# Patient Record
Sex: Male | Born: 1935 | Race: White | Hispanic: No | Smoking: Former smoker
Health system: Southern US, Community
[De-identification: ages and names within clinical notes are randomized; demographics above are authoritative.]

## PROBLEM LIST (undated history)

## (undated) DIAGNOSIS — F419 Anxiety disorder, unspecified: Secondary | ICD-10-CM

## (undated) DIAGNOSIS — I480 Paroxysmal atrial fibrillation: Secondary | ICD-10-CM

## (undated) DIAGNOSIS — C3490 Malignant neoplasm of unspecified part of unspecified bronchus or lung: Secondary | ICD-10-CM

## (undated) DIAGNOSIS — I219 Acute myocardial infarction, unspecified: Secondary | ICD-10-CM

## (undated) DIAGNOSIS — D509 Iron deficiency anemia, unspecified: Secondary | ICD-10-CM

## (undated) DIAGNOSIS — I519 Heart disease, unspecified: Secondary | ICD-10-CM

## (undated) DIAGNOSIS — K219 Gastro-esophageal reflux disease without esophagitis: Secondary | ICD-10-CM

## (undated) DIAGNOSIS — I2510 Atherosclerotic heart disease of native coronary artery without angina pectoris: Secondary | ICD-10-CM

## (undated) DIAGNOSIS — Z8719 Personal history of other diseases of the digestive system: Secondary | ICD-10-CM

## (undated) DIAGNOSIS — M545 Low back pain, unspecified: Secondary | ICD-10-CM

## (undated) DIAGNOSIS — Z8489 Family history of other specified conditions: Secondary | ICD-10-CM

## (undated) DIAGNOSIS — I10 Essential (primary) hypertension: Secondary | ICD-10-CM

## (undated) DIAGNOSIS — Z8711 Personal history of peptic ulcer disease: Secondary | ICD-10-CM

## (undated) DIAGNOSIS — J449 Chronic obstructive pulmonary disease, unspecified: Secondary | ICD-10-CM

## (undated) DIAGNOSIS — Z9289 Personal history of other medical treatment: Secondary | ICD-10-CM

## (undated) DIAGNOSIS — J189 Pneumonia, unspecified organism: Secondary | ICD-10-CM

## (undated) DIAGNOSIS — M1990 Unspecified osteoarthritis, unspecified site: Secondary | ICD-10-CM

## (undated) DIAGNOSIS — I493 Ventricular premature depolarization: Secondary | ICD-10-CM

## (undated) DIAGNOSIS — I739 Peripheral vascular disease, unspecified: Secondary | ICD-10-CM

## (undated) DIAGNOSIS — I209 Angina pectoris, unspecified: Secondary | ICD-10-CM

## (undated) DIAGNOSIS — G8929 Other chronic pain: Secondary | ICD-10-CM

## (undated) HISTORY — PX: APPENDECTOMY: SHX54

## (undated) HISTORY — DX: Chronic obstructive pulmonary disease, unspecified: J44.9

## (undated) HISTORY — PX: BACK SURGERY: SHX140

## (undated) HISTORY — DX: Peripheral vascular disease, unspecified: I73.9

## (undated) HISTORY — PX: LOBECTOMY: SHX5089

## (undated) HISTORY — DX: Atherosclerotic heart disease of native coronary artery without angina pectoris: I25.10

## (undated) HISTORY — PX: ANTERIOR CERVICAL DECOMP/DISCECTOMY FUSION: SHX1161

## (undated) HISTORY — DX: Paroxysmal atrial fibrillation: I48.0

## (undated) HISTORY — DX: Heart disease, unspecified: I51.9

## (undated) HISTORY — DX: Iron deficiency anemia, unspecified: D50.9

## (undated) HISTORY — PX: INGUINAL HERNIA REPAIR: SUR1180

## (undated) HISTORY — DX: Malignant neoplasm of unspecified part of unspecified bronchus or lung: C34.90

## (undated) HISTORY — DX: Ventricular premature depolarization: I49.3

## (undated) HISTORY — PX: CARDIAC CATHETERIZATION: SHX172

## (undated) HISTORY — PX: CATARACT EXTRACTION W/ INTRAOCULAR LENS  IMPLANT, BILATERAL: SHX1307

## (undated) HISTORY — DX: Essential (primary) hypertension: I10

---

## 1970-02-23 HISTORY — PX: TONSILLECTOMY: SUR1361

## 1997-06-22 ENCOUNTER — Encounter: Admission: RE | Admit: 1997-06-22 | Discharge: 1997-09-20 | Payer: Self-pay | Admitting: Neurological Surgery

## 1997-08-07 ENCOUNTER — Ambulatory Visit (HOSPITAL_COMMUNITY): Admission: RE | Admit: 1997-08-07 | Discharge: 1997-08-07 | Payer: Self-pay | Admitting: Gastroenterology

## 1998-08-09 ENCOUNTER — Encounter: Payer: Self-pay | Admitting: Neurological Surgery

## 1998-08-09 ENCOUNTER — Ambulatory Visit (HOSPITAL_COMMUNITY): Admission: RE | Admit: 1998-08-09 | Discharge: 1998-08-09 | Payer: Self-pay | Admitting: Neurological Surgery

## 1998-12-17 ENCOUNTER — Encounter: Admission: RE | Admit: 1998-12-17 | Discharge: 1998-12-17 | Payer: Self-pay | Admitting: Neurological Surgery

## 1998-12-17 ENCOUNTER — Encounter: Payer: Self-pay | Admitting: Neurological Surgery

## 1998-12-27 ENCOUNTER — Encounter: Payer: Self-pay | Admitting: Neurological Surgery

## 1998-12-27 ENCOUNTER — Ambulatory Visit (HOSPITAL_COMMUNITY): Admission: RE | Admit: 1998-12-27 | Discharge: 1998-12-27 | Payer: Self-pay | Admitting: Neurological Surgery

## 1999-01-15 ENCOUNTER — Encounter: Payer: Self-pay | Admitting: Neurological Surgery

## 1999-01-15 ENCOUNTER — Ambulatory Visit (HOSPITAL_COMMUNITY): Admission: RE | Admit: 1999-01-15 | Discharge: 1999-01-15 | Payer: Self-pay | Admitting: Neurological Surgery

## 1999-06-23 ENCOUNTER — Encounter: Payer: Self-pay | Admitting: Emergency Medicine

## 1999-06-23 ENCOUNTER — Inpatient Hospital Stay (HOSPITAL_COMMUNITY): Admission: EM | Admit: 1999-06-23 | Discharge: 1999-06-24 | Payer: Self-pay | Admitting: Emergency Medicine

## 1999-09-23 ENCOUNTER — Ambulatory Visit (HOSPITAL_COMMUNITY): Admission: RE | Admit: 1999-09-23 | Discharge: 1999-09-23 | Payer: Self-pay | Admitting: Gastroenterology

## 1999-11-21 ENCOUNTER — Encounter: Payer: Self-pay | Admitting: Urology

## 1999-11-21 ENCOUNTER — Encounter: Admission: RE | Admit: 1999-11-21 | Discharge: 1999-11-21 | Payer: Self-pay | Admitting: Urology

## 2000-01-12 ENCOUNTER — Encounter: Payer: Self-pay | Admitting: Emergency Medicine

## 2000-01-12 ENCOUNTER — Emergency Department (HOSPITAL_COMMUNITY): Admission: EM | Admit: 2000-01-12 | Discharge: 2000-01-12 | Payer: Self-pay | Admitting: Emergency Medicine

## 2000-04-06 ENCOUNTER — Ambulatory Visit (HOSPITAL_COMMUNITY): Admission: RE | Admit: 2000-04-06 | Discharge: 2000-04-06 | Payer: Self-pay | Admitting: Gastroenterology

## 2000-04-06 ENCOUNTER — Encounter (INDEPENDENT_AMBULATORY_CARE_PROVIDER_SITE_OTHER): Payer: Self-pay

## 2000-05-14 ENCOUNTER — Encounter: Payer: Self-pay | Admitting: Neurological Surgery

## 2000-05-14 ENCOUNTER — Encounter: Admission: RE | Admit: 2000-05-14 | Discharge: 2000-05-14 | Payer: Self-pay | Admitting: Neurological Surgery

## 2001-05-02 ENCOUNTER — Encounter: Payer: Self-pay | Admitting: Gastroenterology

## 2001-05-02 ENCOUNTER — Ambulatory Visit (HOSPITAL_COMMUNITY): Admission: RE | Admit: 2001-05-02 | Discharge: 2001-05-02 | Payer: Self-pay | Admitting: Family Medicine

## 2001-05-02 HISTORY — PX: OTHER SURGICAL HISTORY: SHX169

## 2001-11-28 ENCOUNTER — Encounter: Payer: Self-pay | Admitting: Family Medicine

## 2001-11-28 ENCOUNTER — Encounter: Admission: RE | Admit: 2001-11-28 | Discharge: 2001-11-28 | Payer: Self-pay | Admitting: Family Medicine

## 2003-07-11 ENCOUNTER — Ambulatory Visit (HOSPITAL_COMMUNITY): Admission: RE | Admit: 2003-07-11 | Discharge: 2003-07-11 | Payer: Self-pay | Admitting: Vascular Surgery

## 2003-07-11 HISTORY — PX: OTHER SURGICAL HISTORY: SHX169

## 2004-02-24 HISTORY — PX: CORONARY ANGIOPLASTY WITH STENT PLACEMENT: SHX49

## 2004-03-25 ENCOUNTER — Inpatient Hospital Stay (HOSPITAL_BASED_OUTPATIENT_CLINIC_OR_DEPARTMENT_OTHER): Admission: RE | Admit: 2004-03-25 | Discharge: 2004-03-25 | Payer: Self-pay | Admitting: Cardiology

## 2004-03-27 ENCOUNTER — Ambulatory Visit (HOSPITAL_COMMUNITY): Admission: RE | Admit: 2004-03-27 | Discharge: 2004-03-28 | Payer: Self-pay | Admitting: Cardiology

## 2004-12-04 ENCOUNTER — Ambulatory Visit (HOSPITAL_COMMUNITY): Admission: RE | Admit: 2004-12-04 | Discharge: 2004-12-04 | Payer: Self-pay | Admitting: Urology

## 2005-09-30 ENCOUNTER — Encounter: Admission: RE | Admit: 2005-09-30 | Discharge: 2005-09-30 | Payer: Self-pay | Admitting: Family Medicine

## 2005-10-07 ENCOUNTER — Ambulatory Visit (HOSPITAL_COMMUNITY): Admission: RE | Admit: 2005-10-07 | Discharge: 2005-10-07 | Payer: Self-pay | Admitting: Family Medicine

## 2005-10-19 ENCOUNTER — Ambulatory Visit (HOSPITAL_COMMUNITY): Admission: RE | Admit: 2005-10-19 | Discharge: 2005-10-19 | Payer: Self-pay | Admitting: Thoracic Surgery

## 2005-10-20 ENCOUNTER — Encounter (INDEPENDENT_AMBULATORY_CARE_PROVIDER_SITE_OTHER): Payer: Self-pay | Admitting: *Deleted

## 2005-10-20 ENCOUNTER — Ambulatory Visit (HOSPITAL_COMMUNITY): Admission: RE | Admit: 2005-10-20 | Discharge: 2005-10-20 | Payer: Self-pay | Admitting: Thoracic Surgery

## 2005-10-20 HISTORY — PX: OTHER SURGICAL HISTORY: SHX169

## 2005-11-02 ENCOUNTER — Encounter (INDEPENDENT_AMBULATORY_CARE_PROVIDER_SITE_OTHER): Payer: Self-pay | Admitting: Specialist

## 2005-11-02 ENCOUNTER — Ambulatory Visit (HOSPITAL_COMMUNITY): Admission: RE | Admit: 2005-11-02 | Discharge: 2005-11-02 | Payer: Self-pay | Admitting: Thoracic Surgery

## 2005-12-01 ENCOUNTER — Inpatient Hospital Stay (HOSPITAL_COMMUNITY): Admission: RE | Admit: 2005-12-01 | Discharge: 2005-12-08 | Payer: Self-pay | Admitting: Thoracic Surgery

## 2005-12-01 ENCOUNTER — Encounter (INDEPENDENT_AMBULATORY_CARE_PROVIDER_SITE_OTHER): Payer: Self-pay | Admitting: Specialist

## 2005-12-01 HISTORY — PX: VIDEO ASSISTED THORACOSCOPY: SHX5073

## 2005-12-04 ENCOUNTER — Ambulatory Visit: Payer: Self-pay | Admitting: Internal Medicine

## 2005-12-07 ENCOUNTER — Ambulatory Visit: Payer: Self-pay | Admitting: Internal Medicine

## 2005-12-15 ENCOUNTER — Encounter: Admission: RE | Admit: 2005-12-15 | Discharge: 2005-12-15 | Payer: Self-pay | Admitting: Thoracic Surgery

## 2005-12-22 LAB — COMPREHENSIVE METABOLIC PANEL
ALT: 14 U/L (ref 0–40)
AST: 15 U/L (ref 0–37)
Albumin: 3.7 g/dL (ref 3.5–5.2)
Alkaline Phosphatase: 158 U/L — ABNORMAL HIGH (ref 39–117)
CO2: 29 mEq/L (ref 19–32)
Creatinine, Ser: 0.56 mg/dL (ref 0.40–1.50)
Potassium: 4.4 mEq/L (ref 3.5–5.3)
Sodium: 132 mEq/L — ABNORMAL LOW (ref 135–145)
Total Bilirubin: 0.6 mg/dL (ref 0.3–1.2)
Total Protein: 6.6 g/dL (ref 6.0–8.3)

## 2005-12-22 LAB — CBC WITH DIFFERENTIAL/PLATELET
BASO%: 0.6 % (ref 0.0–2.0)
LYMPH%: 20.6 % (ref 14.0–48.0)
MCHC: 33.3 g/dL (ref 32.0–35.9)
MONO#: 0.9 10*3/uL (ref 0.1–0.9)
NEUT#: 5.3 10*3/uL (ref 1.5–6.5)
Platelets: 556 10*3/uL — ABNORMAL HIGH (ref 145–400)
RBC: 4.06 10*6/uL — ABNORMAL LOW (ref 4.20–5.71)
RDW: 18.7 % — ABNORMAL HIGH (ref 11.2–14.6)
WBC: 8.3 10*3/uL (ref 4.0–10.0)

## 2005-12-23 ENCOUNTER — Encounter: Admission: RE | Admit: 2005-12-23 | Discharge: 2005-12-23 | Payer: Self-pay | Admitting: Thoracic Surgery

## 2006-01-05 ENCOUNTER — Encounter: Admission: RE | Admit: 2006-01-05 | Discharge: 2006-01-05 | Payer: Self-pay | Admitting: Thoracic Surgery

## 2006-01-12 LAB — CBC WITH DIFFERENTIAL/PLATELET
BASO%: 1.4 % (ref 0.0–2.0)
Basophils Absolute: 0.1 10*3/uL (ref 0.0–0.1)
EOS%: 4.5 % (ref 0.0–7.0)
HGB: 10 g/dL — ABNORMAL LOW (ref 13.0–17.1)
MCH: 25.2 pg — ABNORMAL LOW (ref 28.0–33.4)
MCHC: 31.6 g/dL — ABNORMAL LOW (ref 32.0–35.9)
MCV: 79.6 fL — ABNORMAL LOW (ref 81.6–98.0)
MONO%: 12.1 % (ref 0.0–13.0)
NEUT%: 61.5 % (ref 40.0–75.0)
RDW: 14.2 % (ref 11.2–14.6)

## 2006-01-12 LAB — COMPREHENSIVE METABOLIC PANEL
AST: 10 U/L (ref 0–37)
Alkaline Phosphatase: 110 U/L (ref 39–117)
BUN: 9 mg/dL (ref 6–23)
Creatinine, Ser: 0.61 mg/dL (ref 0.40–1.50)

## 2006-01-13 ENCOUNTER — Encounter: Payer: Self-pay | Admitting: Vascular Surgery

## 2006-01-13 ENCOUNTER — Ambulatory Visit: Admission: RE | Admit: 2006-01-13 | Discharge: 2006-01-13 | Payer: Self-pay | Admitting: Internal Medicine

## 2006-01-19 LAB — COMPREHENSIVE METABOLIC PANEL
ALT: 18 U/L (ref 0–53)
Albumin: 3.7 g/dL (ref 3.5–5.2)
CO2: 27 mEq/L (ref 19–32)
Calcium: 8.6 mg/dL (ref 8.4–10.5)
Chloride: 97 mEq/L (ref 96–112)
Glucose, Bld: 109 mg/dL — ABNORMAL HIGH (ref 70–99)
Potassium: 4.4 mEq/L (ref 3.5–5.3)
Sodium: 131 mEq/L — ABNORMAL LOW (ref 135–145)
Total Bilirubin: 0.6 mg/dL (ref 0.3–1.2)
Total Protein: 6.8 g/dL (ref 6.0–8.3)

## 2006-01-19 LAB — CBC WITH DIFFERENTIAL/PLATELET
Basophils Absolute: 0 10*3/uL (ref 0.0–0.1)
EOS%: 8.9 % — ABNORMAL HIGH (ref 0.0–7.0)
HCT: 29.9 % — ABNORMAL LOW (ref 38.7–49.9)
HGB: 9.9 g/dL — ABNORMAL LOW (ref 13.0–17.1)
LYMPH%: 34.4 % (ref 14.0–48.0)
MCH: 25.6 pg — ABNORMAL LOW (ref 28.0–33.4)
MCV: 77.7 fL — ABNORMAL LOW (ref 81.6–98.0)
MONO%: 11.8 % (ref 0.0–13.0)
NEUT%: 44.3 % (ref 40.0–75.0)
Platelets: 323 10*3/uL (ref 145–400)

## 2006-01-21 ENCOUNTER — Ambulatory Visit: Payer: Self-pay | Admitting: Internal Medicine

## 2006-01-26 LAB — COMPREHENSIVE METABOLIC PANEL
ALT: 15 U/L (ref 0–53)
AST: 15 U/L (ref 0–37)
Albumin: 3.7 g/dL (ref 3.5–5.2)
Alkaline Phosphatase: 149 U/L — ABNORMAL HIGH (ref 39–117)
BUN: 11 mg/dL (ref 6–23)
Calcium: 8.9 mg/dL (ref 8.4–10.5)
Chloride: 98 mEq/L (ref 96–112)
Potassium: 4.5 mEq/L (ref 3.5–5.3)
Sodium: 134 mEq/L — ABNORMAL LOW (ref 135–145)
Total Protein: 6.9 g/dL (ref 6.0–8.3)

## 2006-01-26 LAB — CBC WITH DIFFERENTIAL/PLATELET
Basophils Absolute: 0 10*3/uL (ref 0.0–0.1)
EOS%: 0.8 % (ref 0.0–7.0)
HGB: 10.7 g/dL — ABNORMAL LOW (ref 13.0–17.1)
MCH: 25.3 pg — ABNORMAL LOW (ref 28.0–33.4)
MCV: 76.9 fL — ABNORMAL LOW (ref 81.6–98.0)
MONO%: 3.2 % (ref 0.0–13.0)
NEUT#: 15.1 10*3/uL — ABNORMAL HIGH (ref 1.5–6.5)
RBC: 4.25 10*6/uL (ref 4.20–5.71)
RDW: 18 % — ABNORMAL HIGH (ref 11.2–14.6)
lymph#: 1.9 10*3/uL (ref 0.9–3.3)

## 2006-02-03 LAB — CBC WITH DIFFERENTIAL/PLATELET
Basophils Absolute: 0.2 10*3/uL — ABNORMAL HIGH (ref 0.0–0.1)
EOS%: 2.6 % (ref 0.0–7.0)
HCT: 30.1 % — ABNORMAL LOW (ref 38.7–49.9)
HGB: 9.6 g/dL — ABNORMAL LOW (ref 13.0–17.1)
LYMPH%: 21.3 % (ref 14.0–48.0)
MCH: 24.5 pg — ABNORMAL LOW (ref 28.0–33.4)
MCHC: 31.8 g/dL — ABNORMAL LOW (ref 32.0–35.9)
MCV: 77.2 fL — ABNORMAL LOW (ref 81.6–98.0)
NEUT%: 61.3 % (ref 40.0–75.0)
Platelets: 409 10*3/uL — ABNORMAL HIGH (ref 145–400)
lymph#: 2 10*3/uL (ref 0.9–3.3)

## 2006-02-03 LAB — COMPREHENSIVE METABOLIC PANEL
ALT: 11 U/L (ref 0–53)
AST: 12 U/L (ref 0–37)
Alkaline Phosphatase: 107 U/L (ref 39–117)
BUN: 16 mg/dL (ref 6–23)
Calcium: 9 mg/dL (ref 8.4–10.5)
Creatinine, Ser: 0.5 mg/dL (ref 0.40–1.50)
Total Bilirubin: 0.4 mg/dL (ref 0.3–1.2)

## 2006-02-15 ENCOUNTER — Emergency Department (HOSPITAL_COMMUNITY): Admission: EM | Admit: 2006-02-15 | Discharge: 2006-02-15 | Payer: Self-pay | Admitting: Emergency Medicine

## 2006-02-24 LAB — COMPREHENSIVE METABOLIC PANEL
ALT: 8 U/L (ref 0–53)
AST: 10 U/L (ref 0–37)
Alkaline Phosphatase: 107 U/L (ref 39–117)
Sodium: 135 mEq/L (ref 135–145)
Total Bilirubin: 0.4 mg/dL (ref 0.3–1.2)
Total Protein: 6.7 g/dL (ref 6.0–8.3)

## 2006-02-24 LAB — CBC WITH DIFFERENTIAL/PLATELET
BASO%: 0.6 % (ref 0.0–2.0)
EOS%: 0.8 % (ref 0.0–7.0)
LYMPH%: 15.6 % (ref 14.0–48.0)
MCHC: 32.6 g/dL (ref 32.0–35.9)
MCV: 76.8 fL — ABNORMAL LOW (ref 81.6–98.0)
MONO%: 9.3 % (ref 0.0–13.0)
Platelets: 341 10*3/uL (ref 145–400)
RBC: 3.83 10*6/uL — ABNORMAL LOW (ref 4.20–5.71)

## 2006-03-08 ENCOUNTER — Ambulatory Visit: Payer: Self-pay | Admitting: Internal Medicine

## 2006-03-10 LAB — COMPREHENSIVE METABOLIC PANEL
ALT: <8 U/L (ref 0–53)
AST: 10 U/L (ref 0–37)
Albumin: 3.6 g/dL (ref 3.5–5.2)
CO2: 28 mEq/L (ref 19–32)
Calcium: 9 mg/dL (ref 8.4–10.5)
Chloride: 100 mEq/L (ref 96–112)
Creatinine, Ser: 0.55 mg/dL (ref 0.40–1.50)
Potassium: 4 mEq/L (ref 3.5–5.3)
Sodium: 136 mEq/L (ref 135–145)
Total Protein: 6.9 g/dL (ref 6.0–8.3)

## 2006-03-10 LAB — CBC WITH DIFFERENTIAL/PLATELET
Eosinophils Absolute: 0.4 10*3/uL (ref 0.0–0.5)
LYMPH%: 29.3 % (ref 14.0–48.0)
MCHC: 32.8 g/dL (ref 32.0–35.9)
MCV: 76.4 fL — ABNORMAL LOW (ref 81.6–98.0)
MONO%: 13.3 % — ABNORMAL HIGH (ref 0.0–13.0)
NEUT#: 2.7 10*3/uL (ref 1.5–6.5)
Platelets: 490 10*3/uL — ABNORMAL HIGH (ref 145–400)
RBC: 3.73 10*6/uL — ABNORMAL LOW (ref 4.20–5.71)

## 2006-03-17 ENCOUNTER — Ambulatory Visit (HOSPITAL_COMMUNITY): Admission: RE | Admit: 2006-03-17 | Discharge: 2006-03-17 | Payer: Self-pay | Admitting: Internal Medicine

## 2006-03-21 ENCOUNTER — Inpatient Hospital Stay (HOSPITAL_COMMUNITY): Admission: EM | Admit: 2006-03-21 | Discharge: 2006-03-23 | Payer: Self-pay | Admitting: *Deleted

## 2006-03-22 ENCOUNTER — Encounter: Payer: Self-pay | Admitting: Cardiology

## 2006-03-31 ENCOUNTER — Ambulatory Visit: Payer: Self-pay | Admitting: Thoracic Surgery

## 2006-03-31 ENCOUNTER — Encounter: Admission: RE | Admit: 2006-03-31 | Discharge: 2006-03-31 | Payer: Self-pay | Admitting: Thoracic Surgery

## 2006-07-14 ENCOUNTER — Ambulatory Visit: Payer: Self-pay | Admitting: Thoracic Surgery

## 2006-07-14 ENCOUNTER — Encounter: Admission: RE | Admit: 2006-07-14 | Discharge: 2006-07-14 | Payer: Self-pay | Admitting: Thoracic Surgery

## 2006-10-06 ENCOUNTER — Encounter: Admission: RE | Admit: 2006-10-06 | Discharge: 2006-10-06 | Payer: Self-pay | Admitting: Family Medicine

## 2006-10-06 ENCOUNTER — Ambulatory Visit: Payer: Self-pay | Admitting: Thoracic Surgery

## 2006-12-17 ENCOUNTER — Emergency Department (HOSPITAL_COMMUNITY): Admission: EM | Admit: 2006-12-17 | Discharge: 2006-12-17 | Payer: Self-pay | Admitting: *Deleted

## 2006-12-18 ENCOUNTER — Inpatient Hospital Stay (HOSPITAL_COMMUNITY): Admission: EM | Admit: 2006-12-18 | Discharge: 2006-12-21 | Payer: Self-pay | Admitting: Emergency Medicine

## 2007-04-20 ENCOUNTER — Encounter: Admission: RE | Admit: 2007-04-20 | Discharge: 2007-04-20 | Payer: Self-pay | Admitting: Thoracic Surgery

## 2007-04-20 ENCOUNTER — Ambulatory Visit: Payer: Self-pay | Admitting: Thoracic Surgery

## 2007-10-26 ENCOUNTER — Ambulatory Visit: Payer: Self-pay | Admitting: Thoracic Surgery

## 2007-10-26 ENCOUNTER — Encounter: Admission: RE | Admit: 2007-10-26 | Discharge: 2007-10-26 | Payer: Self-pay | Admitting: Thoracic Surgery

## 2008-05-30 ENCOUNTER — Ambulatory Visit (HOSPITAL_COMMUNITY): Admission: RE | Admit: 2008-05-30 | Discharge: 2008-05-30 | Payer: Self-pay | Admitting: Neurological Surgery

## 2008-08-24 IMAGING — CR DG CHEST 2V
2 series · 2 of 2 positions shown · non-contrast
Comparison: PET CT 09/30/05.

CLINICAL DATA: 72-year-old.  Preop for left lung mass resection. 
 CHEST - 2 VIEW:

[view not recorded (1 of 2)]
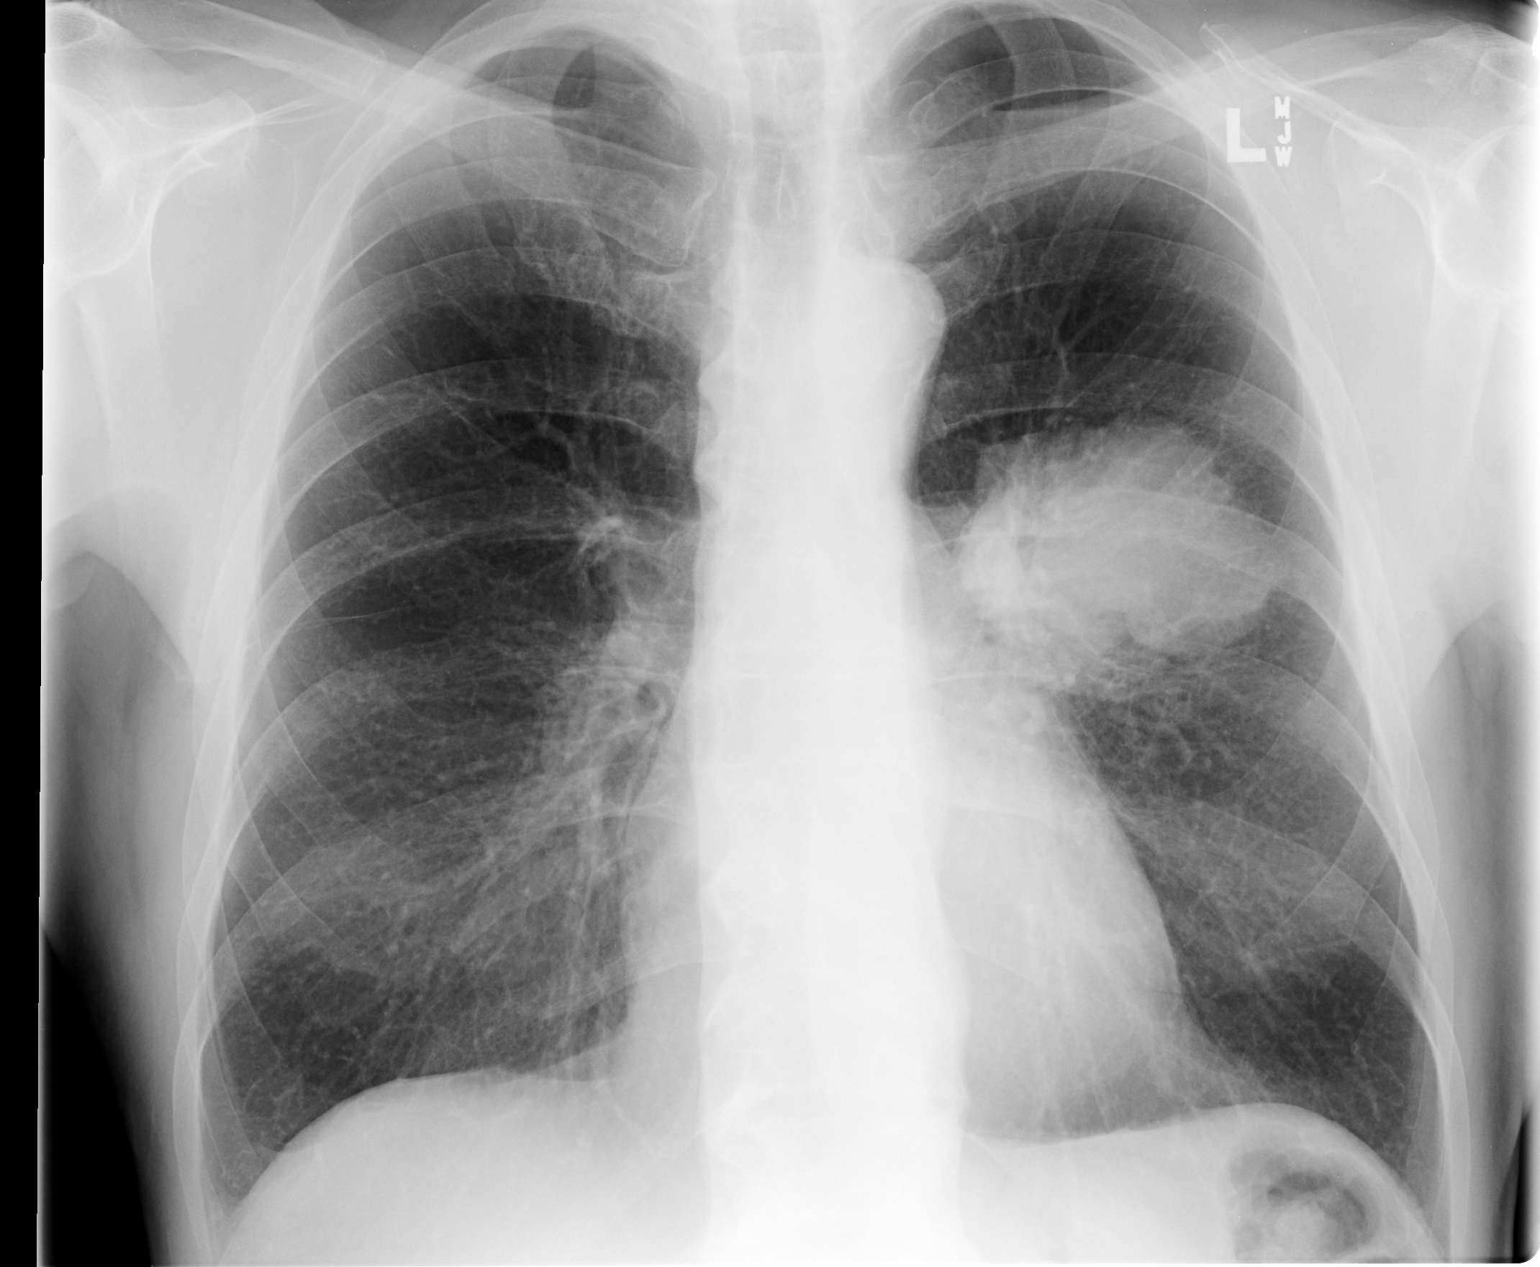

[view not recorded (2 of 2)]
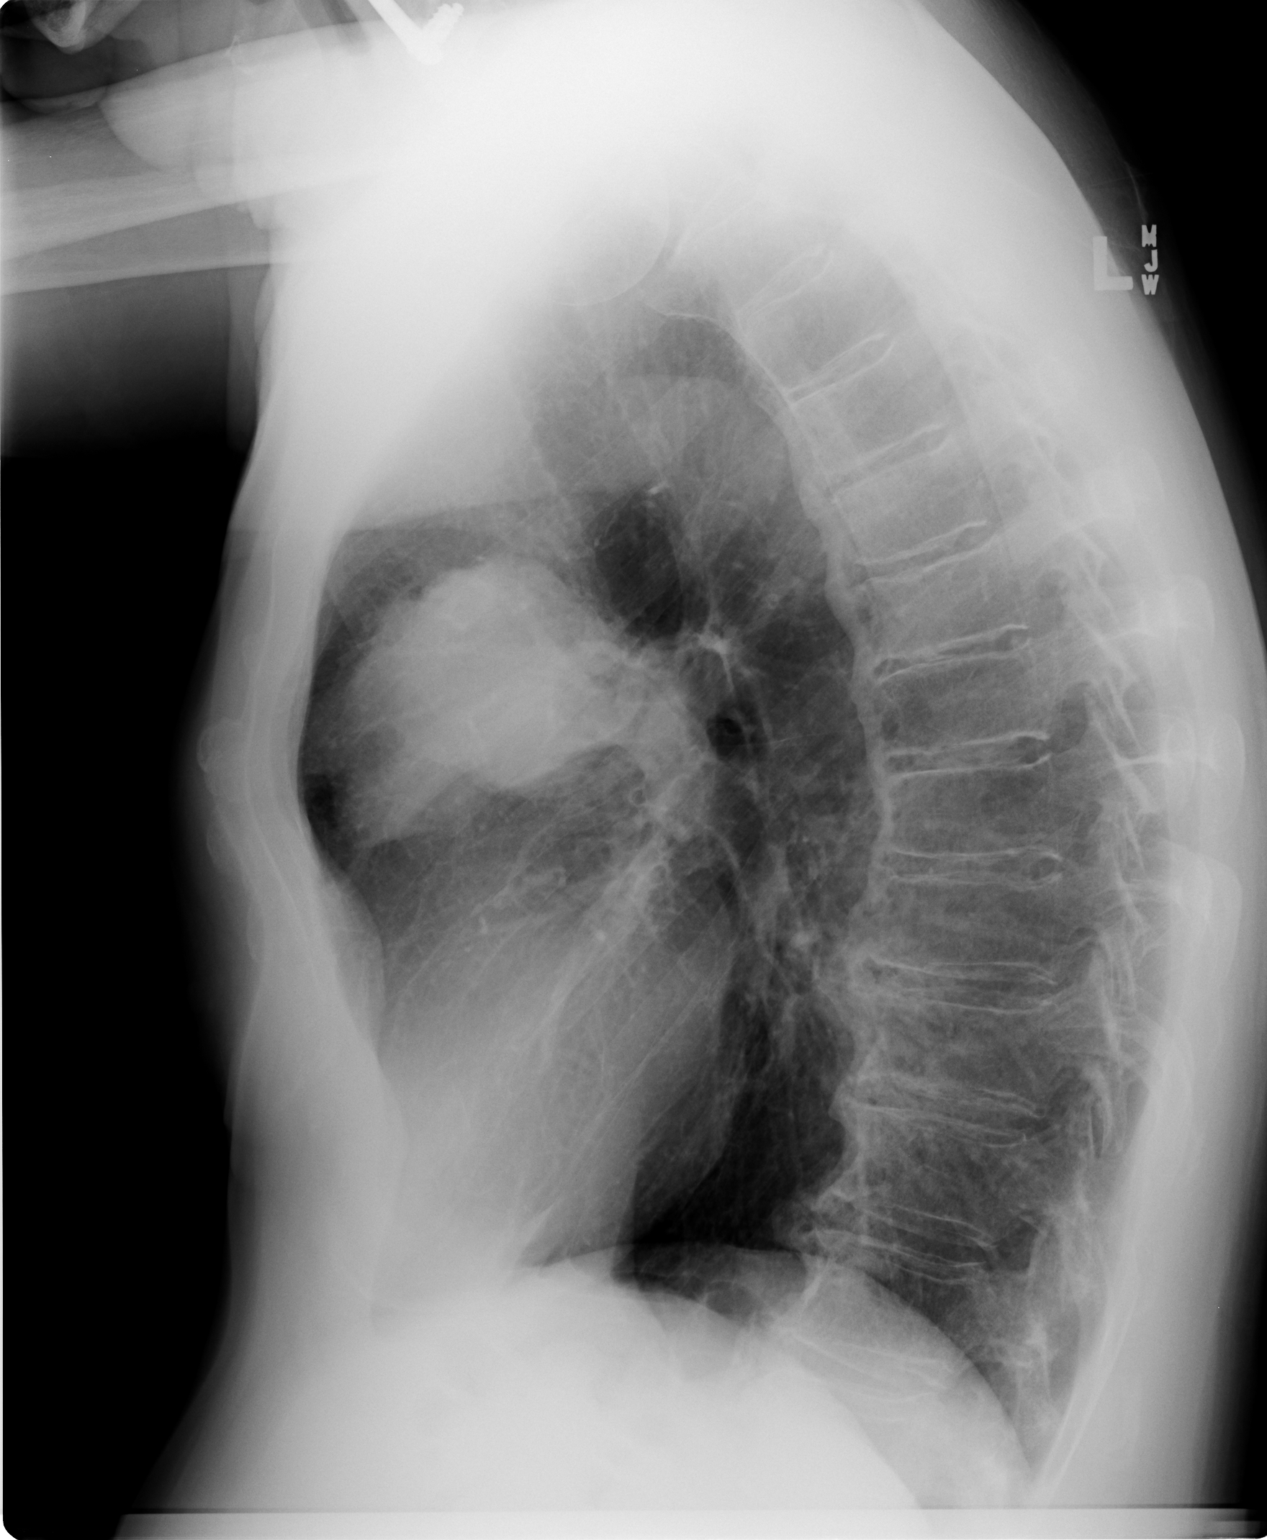

[2 of 2 positions shown; findings below may reference images not displayed]

FINDINGS: There is a large, 8.3 x 7.8 cm left hilar mass which is in the left upper lobe.  There are underlying changes of COPD.  Heart size is normal.  No acute pulmonary findings.
IMPRESSION: 1.  Large left upper lobe lung mass. 
 2.  Underlying changes of COPD. 
 3.  No acute pulmonary findings.

## 2008-10-19 IMAGING — CR DG CHEST 2V
2 series · 2 of 2 positions shown · non-contrast
Comparison: 12/07/05.

CLINICAL DATA: Lung lesion.  Surgery two weeks ago.
 TWO VIEW CHEST:

[w chest pa]
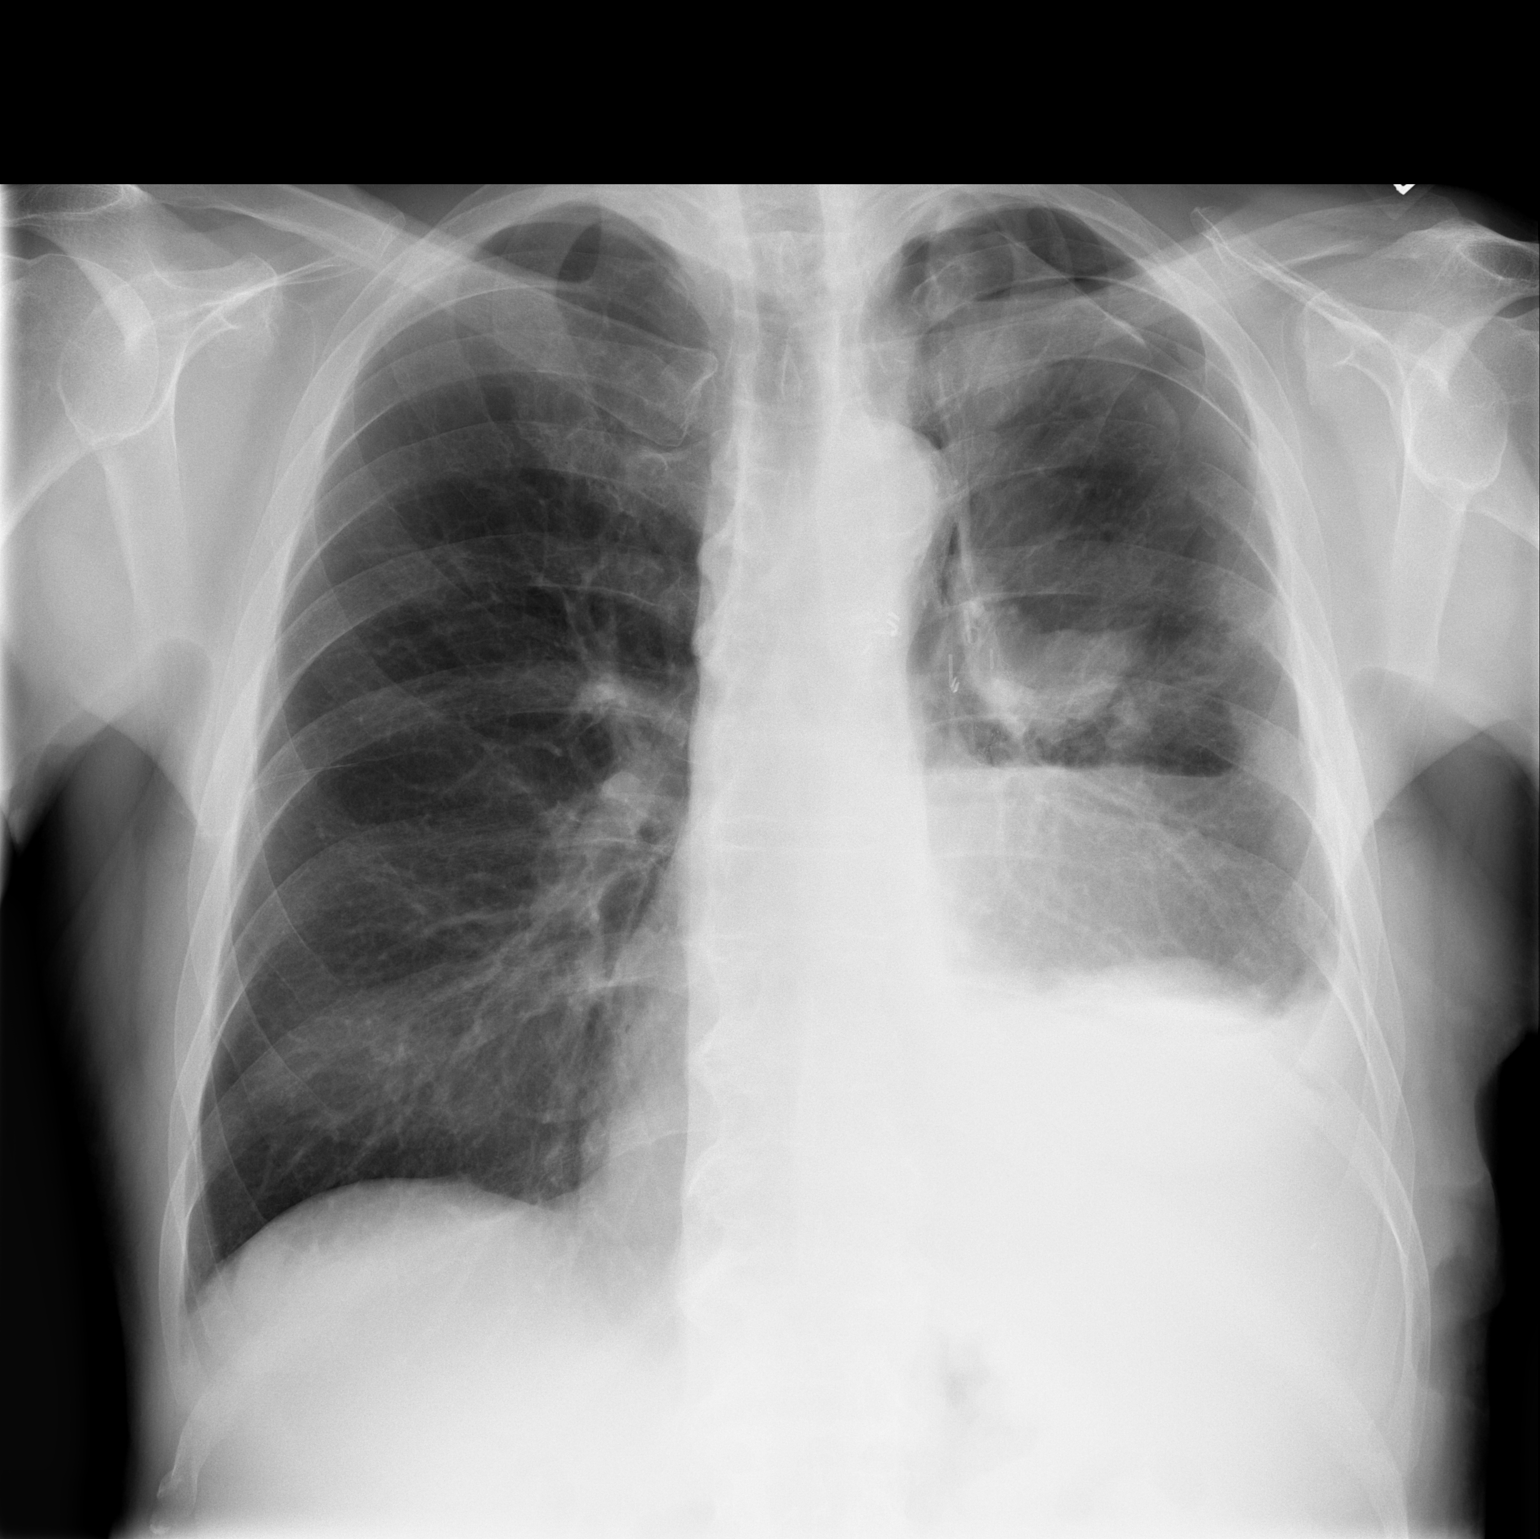

[w chest lat]
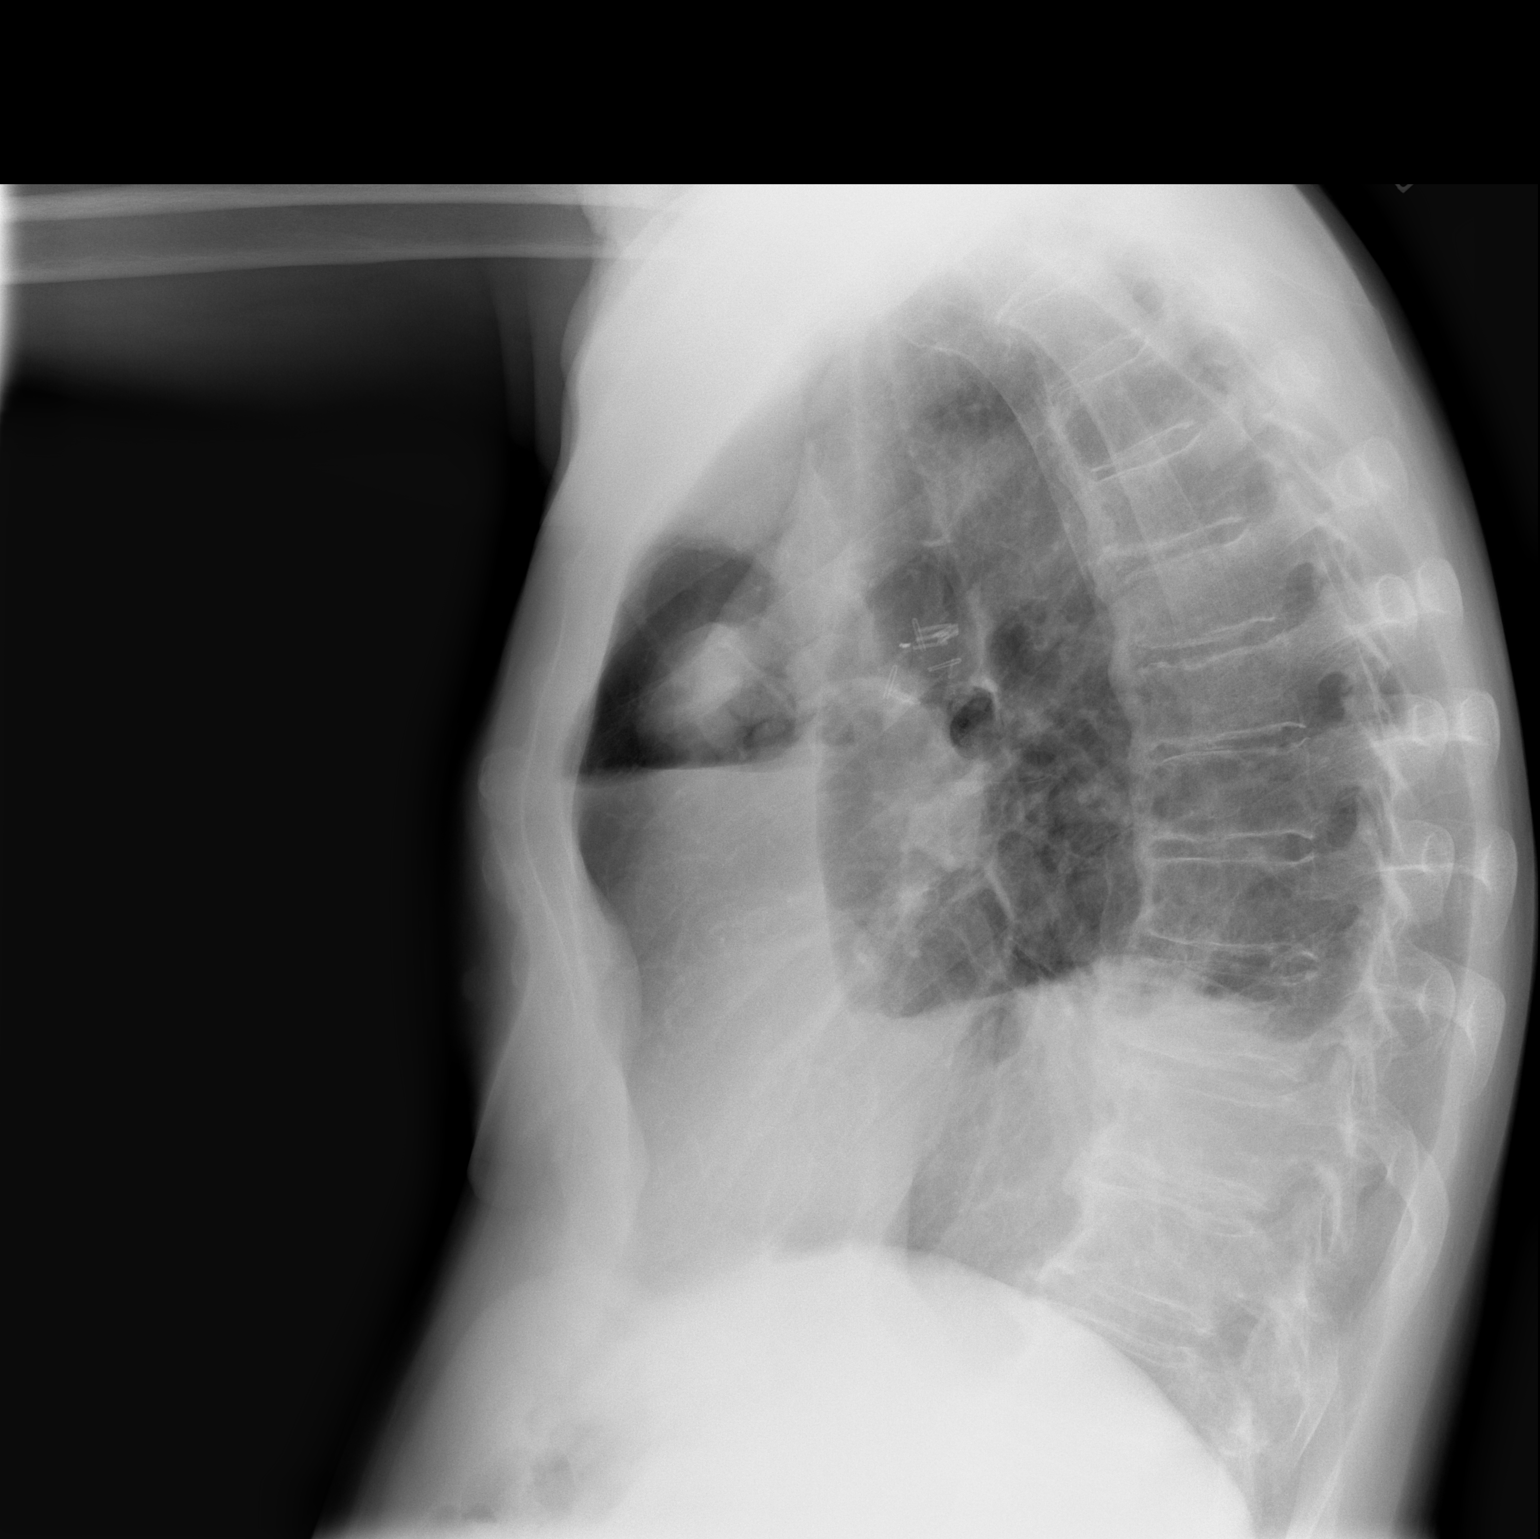

[2 of 2 positions shown; findings below may reference images not displayed]

FINDINGS: There are postoperative changes from left lung tumor resection.  There is associated volume loss with elevation of left hemidiaphragm.  There is a moderate sized left pleural effusion.  A large left sided hydropneumothorax is present.
IMPRESSION: Moderate to large size left hydropneumothorax which, when compared to prior exam, has increased significantly.

## 2008-11-07 ENCOUNTER — Encounter: Admission: RE | Admit: 2008-11-07 | Discharge: 2008-11-07 | Payer: Self-pay | Admitting: Thoracic Surgery

## 2008-11-07 ENCOUNTER — Ambulatory Visit: Payer: Self-pay | Admitting: Thoracic Surgery

## 2008-11-09 IMAGING — CR DG CHEST 2V
2 series · 2 of 2 positions shown · non-contrast
Comparison: 12/23/05.

CLINICAL DATA: Lung cancer.  
 CHEST, TWO VIEWS:

[w chest pa]
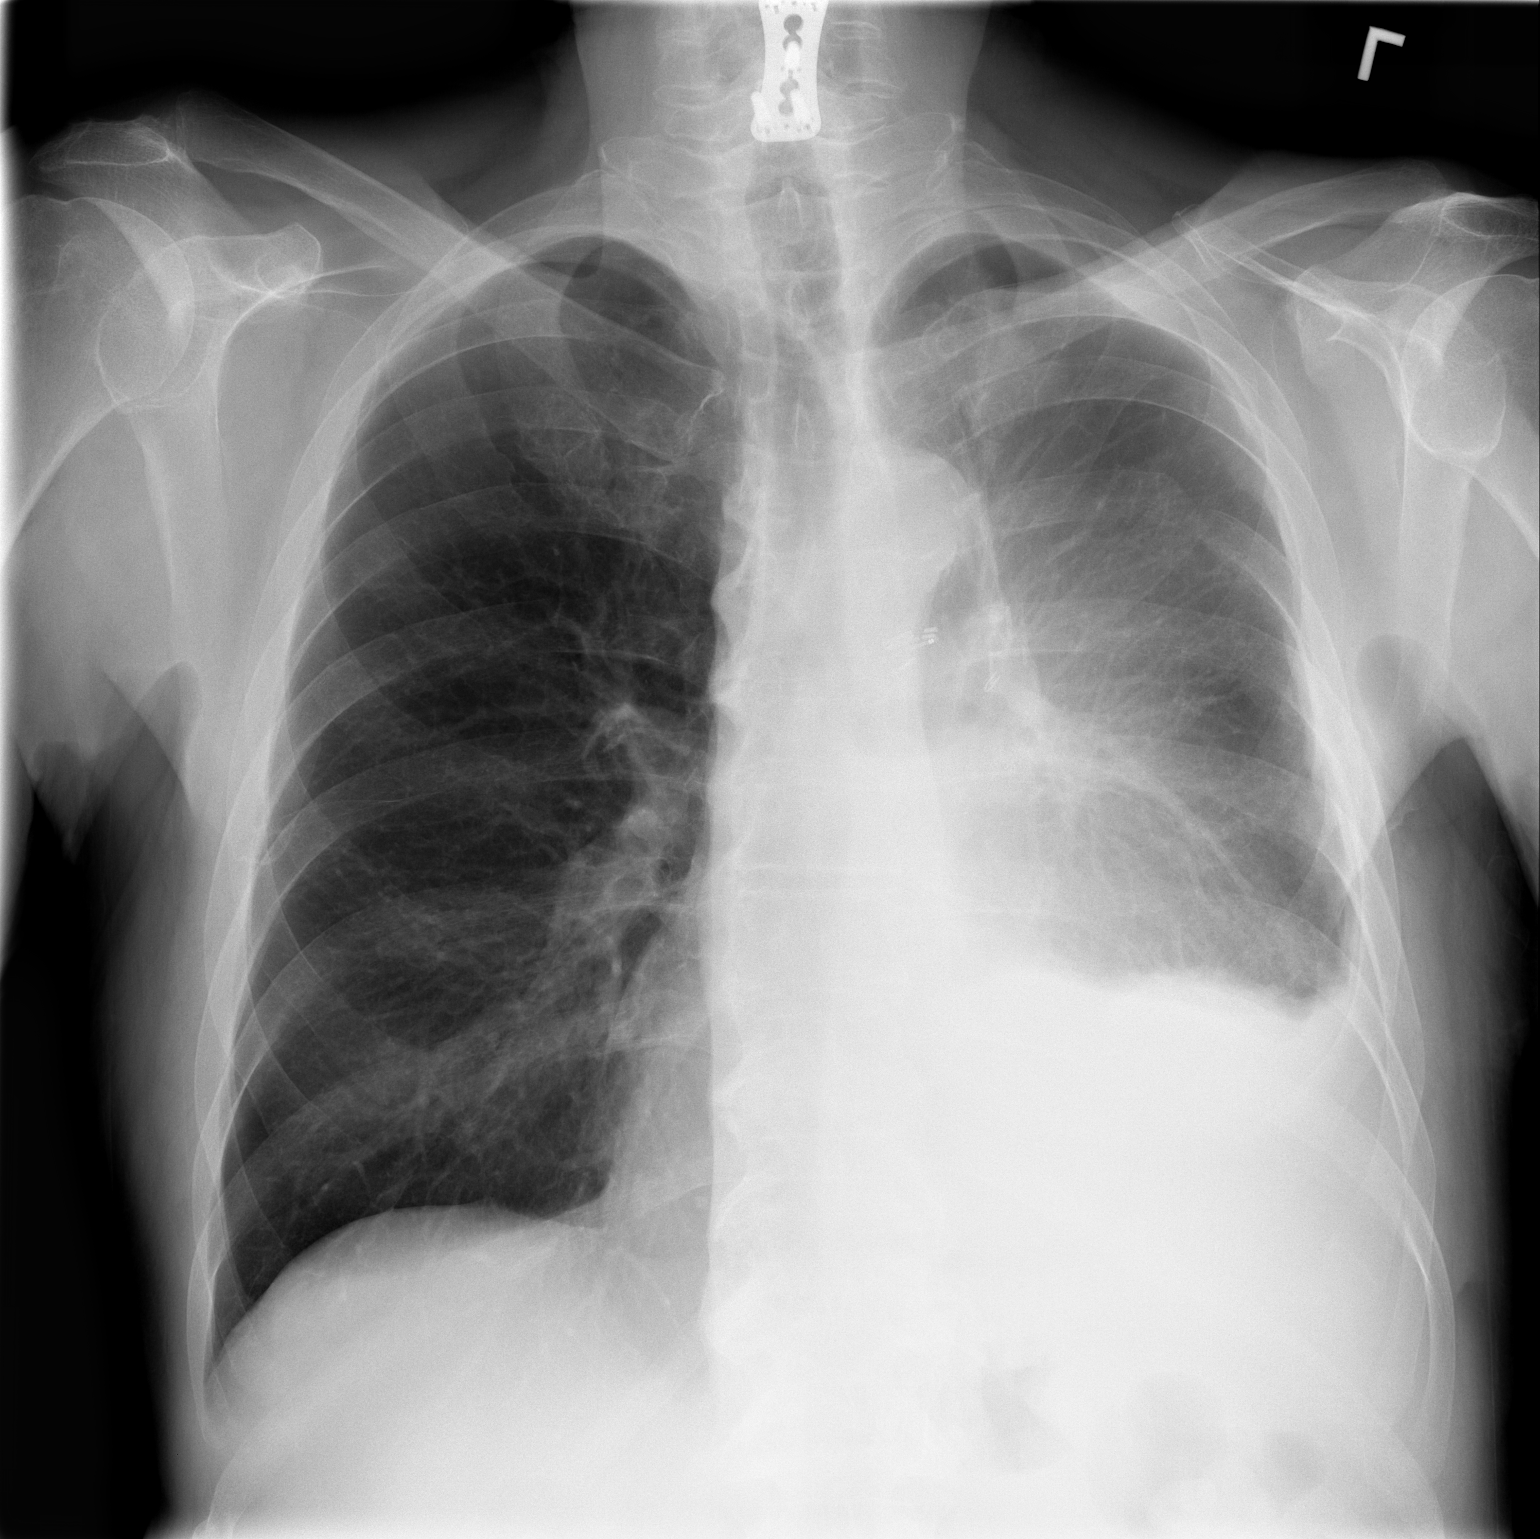

[w chest lat]
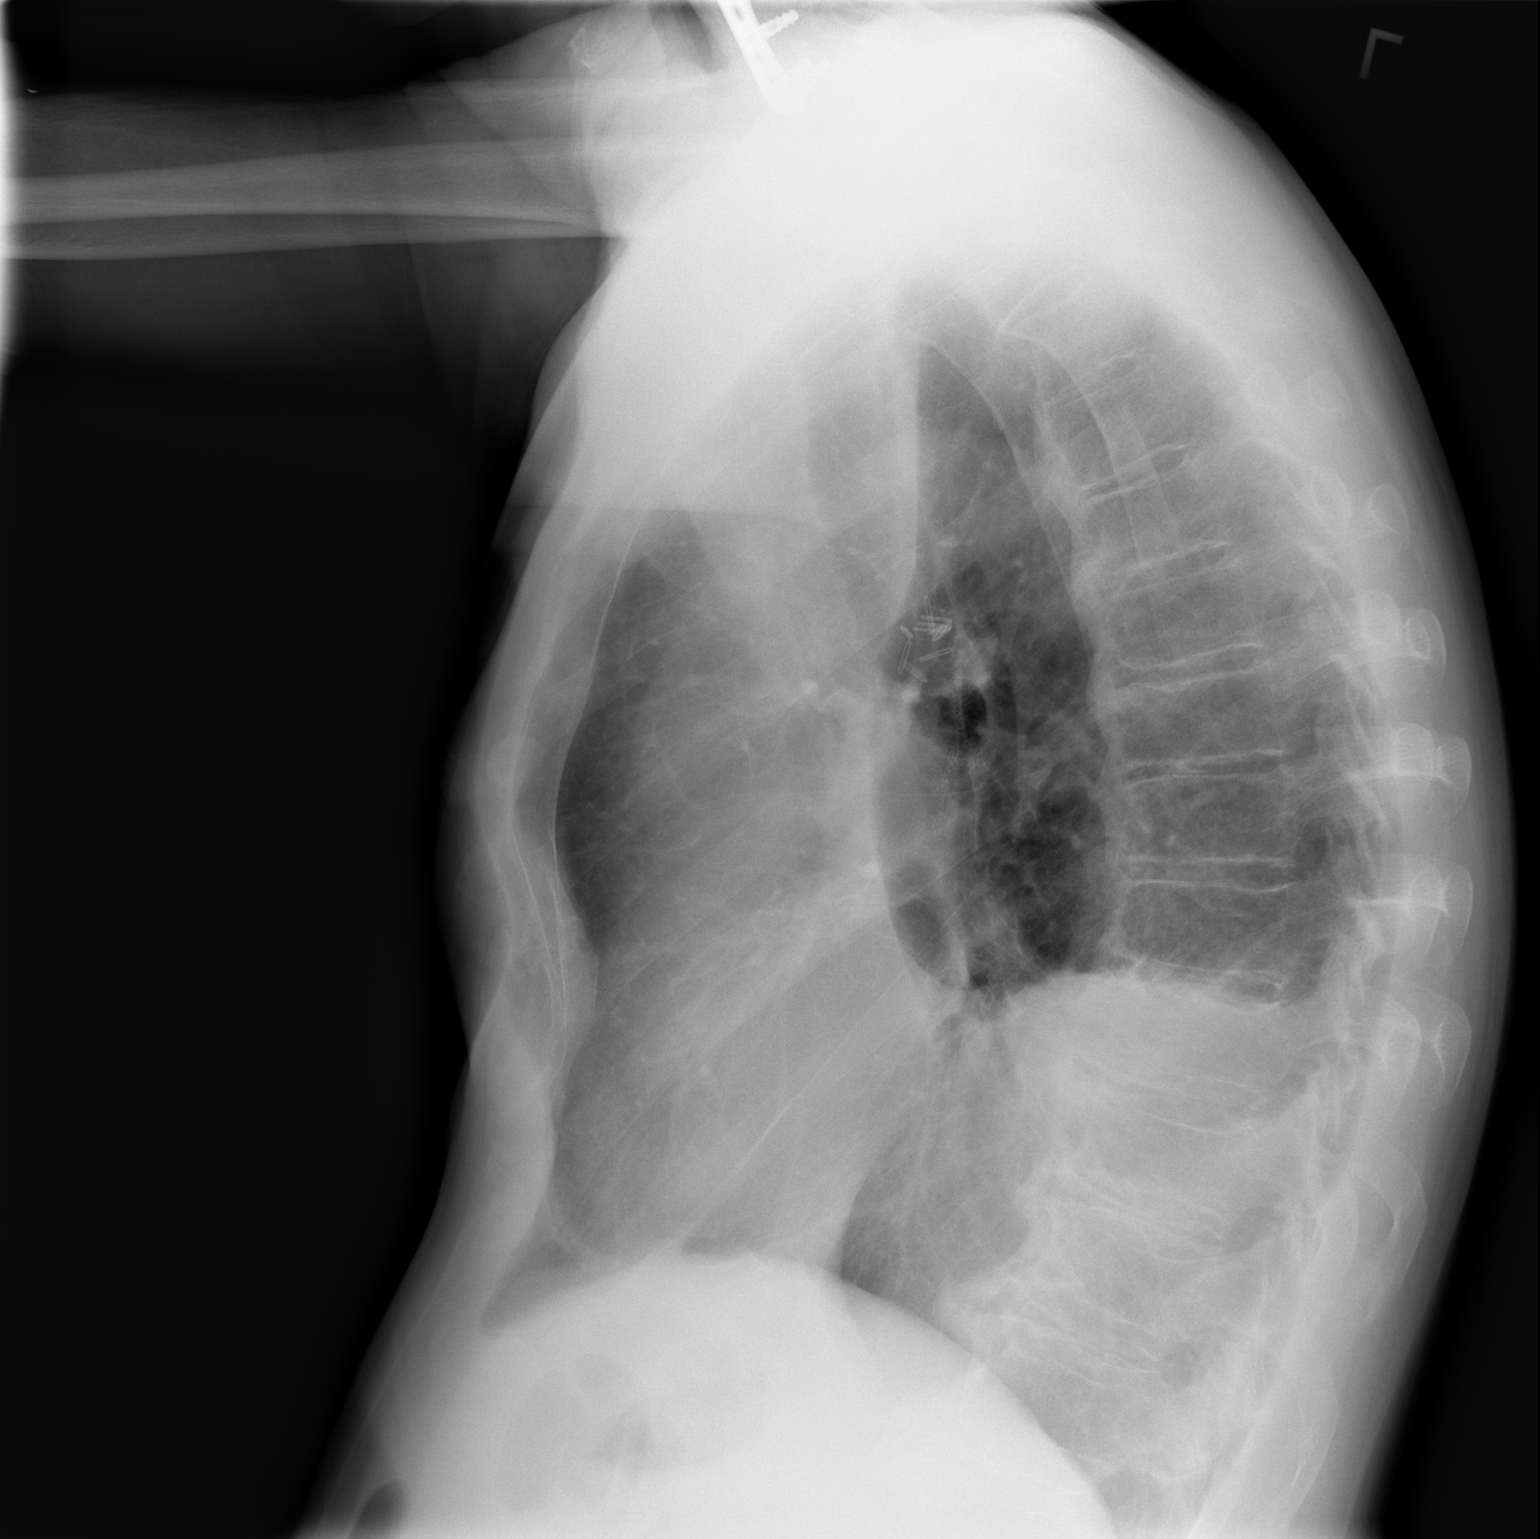

[2 of 2 positions shown; findings below may reference images not displayed]

FINDINGS: Post-op changes involving the left hemithorax with volume loss are again noted.  
 There has been resolution of the left sided pneumothorax.  Small left pleural effusion persists.
IMPRESSION: 1.  Stable post-op changes left lung. 
 2.  Interval resolution of left pneumothorax. 
 3.  Persistent left subpleural fluid collection.

## 2009-05-08 ENCOUNTER — Encounter: Admission: RE | Admit: 2009-05-08 | Discharge: 2009-05-08 | Payer: Self-pay | Admitting: Thoracic Surgery

## 2009-05-08 ENCOUNTER — Ambulatory Visit: Payer: Self-pay | Admitting: Thoracic Surgery

## 2009-09-25 ENCOUNTER — Ambulatory Visit: Payer: Self-pay | Admitting: Diagnostic Radiology

## 2009-09-25 ENCOUNTER — Emergency Department (HOSPITAL_BASED_OUTPATIENT_CLINIC_OR_DEPARTMENT_OTHER): Admission: EM | Admit: 2009-09-25 | Discharge: 2009-09-25 | Payer: Self-pay | Admitting: Emergency Medicine

## 2009-10-22 ENCOUNTER — Ambulatory Visit: Payer: Self-pay | Admitting: Cardiology

## 2009-10-22 ENCOUNTER — Inpatient Hospital Stay (HOSPITAL_COMMUNITY)
Admission: EM | Admit: 2009-10-22 | Discharge: 2009-10-24 | Payer: Self-pay | Source: Home / Self Care | Admitting: Emergency Medicine

## 2009-11-08 ENCOUNTER — Ambulatory Visit: Payer: Self-pay | Admitting: Cardiovascular Disease

## 2009-11-08 ENCOUNTER — Ambulatory Visit (HOSPITAL_COMMUNITY): Admission: RE | Admit: 2009-11-08 | Discharge: 2009-11-08 | Payer: Self-pay | Admitting: Cardiology

## 2009-11-08 ENCOUNTER — Ambulatory Visit: Payer: Self-pay | Admitting: Cardiology

## 2009-11-22 ENCOUNTER — Ambulatory Visit: Payer: Self-pay | Admitting: Cardiology

## 2009-12-06 ENCOUNTER — Ambulatory Visit: Payer: Self-pay | Admitting: Cardiology

## 2009-12-10 ENCOUNTER — Ambulatory Visit: Payer: Self-pay | Admitting: Thoracic Surgery

## 2009-12-10 ENCOUNTER — Encounter: Admission: RE | Admit: 2009-12-10 | Discharge: 2009-12-10 | Payer: Self-pay | Admitting: Thoracic Surgery

## 2010-03-03 ENCOUNTER — Ambulatory Visit: Payer: Self-pay | Admitting: Cardiology

## 2010-03-12 ENCOUNTER — Ambulatory Visit: Admit: 2010-03-12 | Payer: Self-pay | Admitting: Vascular Surgery

## 2010-03-12 ENCOUNTER — Ambulatory Visit
Admission: RE | Admit: 2010-03-12 | Discharge: 2010-03-12 | Payer: Self-pay | Source: Home / Self Care | Attending: Vascular Surgery | Admitting: Vascular Surgery

## 2010-03-15 ENCOUNTER — Encounter: Payer: Self-pay | Admitting: Thoracic Surgery

## 2010-03-16 ENCOUNTER — Encounter: Payer: Self-pay | Admitting: Thoracic Surgery

## 2010-03-18 ENCOUNTER — Ambulatory Visit
Admission: RE | Admit: 2010-03-18 | Discharge: 2010-03-18 | Payer: Self-pay | Source: Home / Self Care | Attending: Vascular Surgery | Admitting: Vascular Surgery

## 2010-03-18 ENCOUNTER — Ambulatory Visit: Admit: 2010-03-18 | Payer: Self-pay | Admitting: Vascular Surgery

## 2010-03-19 NOTE — Consult Note (Signed)
NEW PATIENT CONSULTATION  [name], [name] DOB:  [birth year]                                       03/18/2010 UYQIH#:[id number]  Patient presents today for evaluation of lower extremity claudication symptoms.  He is known to me from a prior right iliac stenting many years ago.  He reports that he has had some progression and bilateral lower extremity claudication, right somewhat worse than the left.  He has remained stable overall, does have a known history of coronary artery disease with prior stenting.  He had a non-ST MI in August this year and underwent further evaluation by Dr. Peter Swaziland.  PAST MEDICAL HISTORY:  He does not have any history of tissue loss.  No history of diabetes.  He does have a history of hypertension. Unfortunately, he does continue to smoke cigarettes.  He does have hyperlipidemia.  FAMILY HISTORY:  Negative for premature atherosclerotic disease  PHYSICAL EXAMINATION:  A well-developed and well-nourished white male appearing his stated age in no acute distress.  Blood pressure is 123/61, pulse 78, respirations 18.  HEENT is normal.  Chest:  Clear bilaterally.  Heart:  Regular rate and rhythm.  Abdomen:  Soft, nontender, thin.  No masses.  No aneurysm palpated.  Musculoskeletal shows no major deformities or cyanosis.  Neurologic:  No focal weakness or paresthesias.  Skin without ulcers or rashes.  He does have palpable radial and palpable femoral pulses bilaterally.  He does have a palpable left popliteal pulse and a 1+ right popliteal pulse.  I do not palpate pedal pulses on the right, but he does have a 1+ left dorsalis pedis pulse.  He underwent noninvasive vascular laboratory studies, and I reviewed this with patient.  This shows an ankle arm index of 0.75 on the right and 0.96 on the left.  He currently is able to tolerate this level of claudication.  I would recommend no further evaluation or intervention at this time.   He understands that if he has progressive changes, that he would be a candidate for arteriography to determine if he is a candidate for bypass or stenting.  We will continue observation only at this time.    Larina Earthly, M.D. Electronically Signed  TFE/MEDQ  D:  03/18/2010  T:  03/19/2010  Job:  [id number]  cc:   [name], M.D.

## 2010-04-11 ENCOUNTER — Emergency Department (HOSPITAL_BASED_OUTPATIENT_CLINIC_OR_DEPARTMENT_OTHER)
Admission: EM | Admit: 2010-04-11 | Discharge: 2010-04-12 | Disposition: A | Discharge status: Other Acute Inpatient Hospital | Payer: Medicare Other | Attending: Emergency Medicine | Admitting: Emergency Medicine

## 2010-04-11 DIAGNOSIS — R5381 Other malaise: Secondary | ICD-10-CM | POA: Insufficient documentation

## 2010-04-11 DIAGNOSIS — I252 Old myocardial infarction: Secondary | ICD-10-CM | POA: Insufficient documentation

## 2010-04-11 DIAGNOSIS — I2510 Atherosclerotic heart disease of native coronary artery without angina pectoris: Secondary | ICD-10-CM | POA: Insufficient documentation

## 2010-04-11 DIAGNOSIS — K922 Gastrointestinal hemorrhage, unspecified: Secondary | ICD-10-CM | POA: Insufficient documentation

## 2010-04-11 DIAGNOSIS — F17200 Nicotine dependence, unspecified, uncomplicated: Secondary | ICD-10-CM | POA: Insufficient documentation

## 2010-04-11 DIAGNOSIS — Z79899 Other long term (current) drug therapy: Secondary | ICD-10-CM | POA: Insufficient documentation

## 2010-04-11 DIAGNOSIS — D649 Anemia, unspecified: Secondary | ICD-10-CM | POA: Insufficient documentation

## 2010-04-11 DIAGNOSIS — R42 Dizziness and giddiness: Secondary | ICD-10-CM | POA: Insufficient documentation

## 2010-04-11 DIAGNOSIS — I10 Essential (primary) hypertension: Secondary | ICD-10-CM | POA: Insufficient documentation

## 2010-04-11 LAB — COMPREHENSIVE METABOLIC PANEL
Albumin: 4.2 g/dL (ref 3.5–5.2)
Calcium: 9.2 mg/dL (ref 8.4–10.5)
Chloride: 95 mEq/L — ABNORMAL LOW (ref 96–112)
Creatinine, Ser: 0.7 mg/dL (ref 0.4–1.5)
GFR calc non Af Amer: >60 mL/min (ref >60)
Glucose, Bld: 106 mg/dL — ABNORMAL HIGH (ref 70–99)
Sodium: 131 mEq/L — ABNORMAL LOW (ref 135–145)
Total Bilirubin: 0.5 mg/dL (ref 0.3–1.2)

## 2010-04-11 LAB — URINALYSIS, ROUTINE W REFLEX MICROSCOPIC: Ketones, ur: NEGATIVE mg/dL

## 2010-04-11 LAB — POCT CARDIAC MARKERS
Myoglobin, poc: 90.9 ng/mL (ref 12–200)
Troponin i, poc: <0.05 ng/mL (ref 0.00–0.09)

## 2010-04-11 LAB — DIFFERENTIAL
Basophils Absolute: 0 10*3/uL (ref 0.0–0.1)
Basophils Relative: 0 % (ref 0–1)
Eosinophils Absolute: 0.2 10*3/uL (ref 0.0–0.7)
Lymphs Abs: 1.5 10*3/uL (ref 0.7–4.0)
Monocytes Absolute: 0.9 10*3/uL (ref 0.1–1.0)

## 2010-04-11 LAB — CBC
HCT: 21.6 % — ABNORMAL LOW (ref 39.0–52.0)
Hemoglobin: 6.4 g/dL — CL (ref 13.0–17.0)
MCH: 17.3 pg — ABNORMAL LOW (ref 26.0–34.0)
WBC: 7.4 10*3/uL (ref 4.0–10.5)

## 2010-04-12 ENCOUNTER — Inpatient Hospital Stay (HOSPITAL_COMMUNITY)
Admission: EM | Admit: 2010-04-12 | Discharge: 2010-04-14 | DRG: 812 | Disposition: A | Discharge status: Home / Self Care | Payer: Medicare Other | Source: Other Acute Inpatient Hospital | Attending: Internal Medicine | Admitting: Internal Medicine

## 2010-04-12 DIAGNOSIS — I10 Essential (primary) hypertension: Secondary | ICD-10-CM | POA: Diagnosis present

## 2010-04-12 DIAGNOSIS — Z9861 Coronary angioplasty status: Secondary | ICD-10-CM

## 2010-04-12 DIAGNOSIS — I2510 Atherosclerotic heart disease of native coronary artery without angina pectoris: Secondary | ICD-10-CM | POA: Diagnosis present

## 2010-04-12 DIAGNOSIS — IMO0002 Reserved for concepts with insufficient information to code with codable children: Secondary | ICD-10-CM

## 2010-04-12 DIAGNOSIS — D500 Iron deficiency anemia secondary to blood loss (chronic): Principal | ICD-10-CM | POA: Diagnosis present

## 2010-04-12 DIAGNOSIS — Z85118 Personal history of other malignant neoplasm of bronchus and lung: Secondary | ICD-10-CM

## 2010-04-12 DIAGNOSIS — Z882 Allergy status to sulfonamides status: Secondary | ICD-10-CM

## 2010-04-12 DIAGNOSIS — I252 Old myocardial infarction: Secondary | ICD-10-CM

## 2010-04-12 DIAGNOSIS — Z7982 Long term (current) use of aspirin: Secondary | ICD-10-CM

## 2010-04-12 DIAGNOSIS — Z7902 Long term (current) use of antithrombotics/antiplatelets: Secondary | ICD-10-CM

## 2010-04-12 DIAGNOSIS — F17200 Nicotine dependence, unspecified, uncomplicated: Secondary | ICD-10-CM | POA: Diagnosis present

## 2010-04-12 LAB — HEMOGLOBIN AND HEMATOCRIT, BLOOD: Hemoglobin: 9.6 g/dL — ABNORMAL LOW (ref 13.0–17.0)

## 2010-04-12 LAB — BASIC METABOLIC PANEL
BUN: 7 mg/dL (ref 6–23)
GFR calc non Af Amer: >60 mL/min (ref >60)
Glucose, Bld: 109 mg/dL — ABNORMAL HIGH (ref 70–99)
Potassium: 4.2 mEq/L (ref 3.5–5.1)

## 2010-04-12 LAB — CBC
MCH: 17 pg — ABNORMAL LOW (ref 26.0–34.0)
MCV: 59.6 fL — ABNORMAL LOW (ref 78.0–100.0)
Platelets: 229 10*3/uL (ref 150–400)
RBC: 3.59 MIL/uL — ABNORMAL LOW (ref 4.22–5.81)
WBC: 7.4 10*3/uL (ref 4.0–10.5)

## 2010-04-12 LAB — MRSA PCR SCREENING: MRSA by PCR: NEGATIVE

## 2010-04-12 LAB — PROTIME-INR
INR: 1.06 (ref 0.00–1.49)
Prothrombin Time: 14 seconds (ref 11.6–15.2)

## 2010-04-13 ENCOUNTER — Other Ambulatory Visit: Payer: Self-pay | Admitting: Gastroenterology

## 2010-04-13 ENCOUNTER — Inpatient Hospital Stay (HOSPITAL_COMMUNITY): Payer: Medicare Other

## 2010-04-13 LAB — CROSSMATCH
Unit division: 0
Unit division: 0

## 2010-04-13 LAB — BASIC METABOLIC PANEL
BUN: 6 mg/dL (ref 6–23)
CO2: 26 mEq/L (ref 19–32)
Calcium: 9.1 mg/dL (ref 8.4–10.5)
Creatinine, Ser: 0.66 mg/dL (ref 0.4–1.5)
Glucose, Bld: 99 mg/dL (ref 70–99)

## 2010-04-13 LAB — CBC
HCT: 28.2 % — ABNORMAL LOW (ref 39.0–52.0)
Hemoglobin: 8.8 g/dL — ABNORMAL LOW (ref 13.0–17.0)
MCH: 20.5 pg — ABNORMAL LOW (ref 26.0–34.0)
MCH: 20.6 pg — ABNORMAL LOW (ref 26.0–34.0)
MCHC: 31.2 g/dL (ref 30.0–36.0)
MCHC: 31.3 g/dL (ref 30.0–36.0)
MCV: 65.7 fL — ABNORMAL LOW (ref 78.0–100.0)
MCV: 65.8 fL — ABNORMAL LOW (ref 78.0–100.0)
Platelets: 228 10*3/uL (ref 150–400)

## 2010-04-13 MED ORDER — IOHEXOL 300 MG/ML  SOLN: 75.0000 mL | Freq: Once | INTRAMUSCULAR | Status: AC | PRN

## 2010-04-14 HISTORY — PX: OTHER SURGICAL HISTORY: SHX169

## 2010-04-14 LAB — IRON AND TIBC
Iron: 12 ug/dL — ABNORMAL LOW (ref 42–135)
UIBC: 385 ug/dL

## 2010-04-14 LAB — POCT I-STAT 3, ART BLOOD GAS (G3+)
Acid-Base Excess: 1 mmol/L (ref 0.0–2.0)
Bicarbonate: 24.8 mEq/L — ABNORMAL HIGH (ref 20.0–24.0)
Patient temperature: 98.1
pH, Arterial: 7.437 (ref 7.350–7.450)

## 2010-04-14 LAB — FERRITIN: Ferritin: 17 ng/mL — ABNORMAL LOW (ref 22–322)

## 2010-04-14 LAB — CBC
MCHC: 30.9 g/dL (ref 30.0–36.0)
Platelets: 201 10*3/uL (ref 150–400)
RDW: 24.1 % — ABNORMAL HIGH (ref 11.5–15.5)
WBC: 7.4 10*3/uL (ref 4.0–10.5)

## 2010-04-14 LAB — BASIC METABOLIC PANEL
BUN: 6 mg/dL (ref 6–23)
Calcium: 8.8 mg/dL (ref 8.4–10.5)
GFR calc non Af Amer: >60 mL/min (ref >60)
Potassium: 3.4 mEq/L — ABNORMAL LOW (ref 3.5–5.1)
Sodium: 127 mEq/L — ABNORMAL LOW (ref 135–145)

## 2010-04-28 NOTE — H&P (Signed)
[name], [name] NO.:  [id number]  MEDICAL RECORD NO.:  [id number]           PATIENT TYPE:  I  LOCATION:  3304                         FACILITY:  [facility]  PHYSICIAN:  Della Goo, M.D. DATE OF BIRTH:  1933/03/10  DATE OF ADMISSION:  04/12/2010 DATE OF DISCHARGE:                             HISTORY & PHYSICAL   PRIMARY CARE PHYSICIAN:  Dr. Christell Constant.  CHIEF COMPLAINT:  Weakness.  HISTORY OF PRESENT ILLNESS:  This is a 77 year old male who went to see his primary care physician secondary to complaints of severe weakness along with edema in both of his ankles.  The patient states that he had been having symptoms off and on for the past 2-3 weeks.  He saw his primary care physician who performed blood work as an outpatient and the results returned revealing a low hemoglobin.  The patient was contacted immediately and advised to go straight to the emergency department.  The patient reported to [facility] Emergency department and laboratory studies were repeated.  His hemoglobin level was found at that time to be 6.4 with an MCV of 58.5.  The patient denied having any hematemesis, hematochezia, or melena passage.  He reports his last BM was 3 days ago.  The patient did report having weakness and lightheadedness and fatigue and in the emergency department the patient was found to be orthostatic.  Arrangements were made to transfer the patient to the [facility] for further evaluation and treatment. Of note, the patient reports having a colonoscopy performed in the past by Dr. Kinnie Scales.  The report could not be obtained through the medical records at this time.  PAST MEDICAL HISTORY:  Significant for coronary artery disease, hypertension, lung cancer status post pneumonectomy, tobacco abuse, osteoarthritis.  MEDICATIONS:  At this time include Celebrex, Imdur, Nexium, Plavix, Plendil, prednisone, Xanax, and aspirin.  ALLERGIES:   SULFA.  SOCIAL HISTORY:  The patient is a retired Theatre manager.  He has a history of tobacco abuse.  Smokes one half pack cigarettes daily for 60 years.  He is a nondrinker.  He denies any illicit drug usage.  FAMILY HISTORY:  Noncontributory.  REVIEW OF SYSTEMS:  Pertinent's as mentioned above.  PHYSICAL EXAMINATION FINDINGS:  GENERAL:  This is a 77 year old well- nourished, well-developed elderly Caucasian male who is in no visible discomfort or acute distress. VITAL SIGNS:  Temperature 98.1, blood pressure 151/47, heart rate 76-90, respirations 19-21, O2 sat 100%. HEENT:  Normocephalic, atraumatic.  Pupils equally round, reactive to light.  Extraocular movements are intact.  Funduscopic benign.  There is no scleral icterus.  Nares are patent bilaterally.  Oropharynx clear. NECK:  Supple.  Full range of motion.  No thyromegaly, adenopathy, or jugular venous distention. CARDIOVASCULAR:  Regular rate and rhythm.  No murmurs, gallops, or rubs appreciated. LUNGS:  Clear to auscultation bilaterally.  No rales, rhonchi, or wheezes. ABDOMEN:  Positive bowel sounds, soft, nontender, nondistended.  No hepatosplenomegaly.  No rebound or guarding. EXTREMITIES:  Without cyanosis, clubbing, or edema at this time. NEUROLOGIC:  Generalized weakness but no focal deficits.  LABORATORY STUDIES:  White  blood cell count 7.4, hemoglobin 6.4, hematocrit 21.6, MCV 58.5, and platelets 290,300.  Sodium 131, potassium 4.1, chloride 95, carbon dioxide 25, BUN 11, creatinine 0.7 and glucose 106, albumin 4.2, AST 14, ALT 14, calcium level 9.2.  ASSESSMENT:  A 77 year old male being admitted with: 1. GI bleeding. 2. Anemia. 3. Weakness. 4. Hypertension. 5. Tobacco abuse.  The patient will be admitted to the step-down ICU area.  Serial H&H, we expect q.8 h. x48 hours, 2 units of packed red blood cells will be transfused, and 1 unit will be placed on hold for now.  An IV Protonix drip has been  ordered and the patient will be placed in SCDs.  Code status has been discussed with the patient as a full code.  The gastroenterology side service covering for Dr. Kinnie Scales will be consulted for further evaluation of this patient.     Della Goo, M.D.     HJ/MEDQ  D:  04/12/2010  T:  04/12/2010  Job:  [id number]  Electronically Signed by Della Goo M.D. on 04/28/2010 08:20:12 PM

## 2010-05-02 NOTE — Op Note (Signed)
  NAMEADRIANO, BISCHOF NO.:  [id number]  MEDICAL RECORD NO.:  [id number]           PATIENT TYPE:  I  LOCATION:  3304                         FACILITY:  [facility]  PHYSICIAN:  Petra Kuba, M.D.    DATE OF BIRTH:  05-05-1933  DATE OF PROCEDURE:  04/13/2010 DATE OF DISCHARGE:                              OPERATIVE REPORT   PROCEDURE:  Esophagogastroduodenoscopy with biopsy.  INDICATIONS:  Patient with iron deficiency, on aspirin and Plavix, some upper tract symptoms, nondiagnostic colonoscopy in May 2010.  Consent was signed after risks, benefits, methods, and options thoroughly discussed prior to sedation.  MEDICINES USED: 1. Fentanyl 50 mcg. 2. Versed 4 mg.  DESCRIPTION OF PROCEDURE:  Video endoscope was inserted by direct vision.  The esophagus was normal.  He did have a small hiatal hernia. Scope passed into the stomach and some mild nodular gastritis was seen and advanced through a normal antrum, normal pylorus into normal duodenal bulb, and around the C-loop to a normal second portion of the duodenum.  No blood was seen distally.  We did take a few biopsies of the duodenum to rule out sprue or celiac disease.  Scope was withdrawn back to the bulb and a good look there ruled out abnormalities in that location.  Scope was withdrawn back to the stomach and retroflexed. Cardia confirmed the hiatal hernia.  The fundus, angularis, lesser and greater curve were evaluated on retroflex on straight visualization, and other than the minimal nodular gastritis which seemed to disappear with more air insufflation, although he had some difficulty holding the air because of his hiatal hernia, no signs of bleeding or other at-risk lesions were seen.  Air was suctioned.  The scope was slowly withdrawn. Again, a good look at the esophagus was normal.  Quick look at the vocal cords normal.  Scope was removed.  The patient tolerated the procedure well.  There was no obvious  immediate complication.  ENDOSCOPIC DIAGNOSES: 1. Small hiatal hernia. 2. Otherwise normal esophagogastroduodenoscopy, status post duodenal     biopsy to rule out celiac disease. 3. Minimal gastritis which seemed to flatten out with maximal air     insufflation, although difficult for him to hold air.  PLAN:  Reevaluate the need for both aspirin and Plavix.  Consider further inpatient or outpatient workup.  We will go ahead and get a CAT scan for his vague abdominal symptoms just to rule out any mass lesion. If no colonoscopy to be done soon, we will add iron to build up his stores.  I am happy to see back p.r.n. or Dr. Kinnie Scales can follow up this case as an outpatient and decide based on repeat CBCs and guaiacs whether a repeat colonoscopy is needed.          ______________________________ Petra Kuba, M.D.     MEM/MEDQ  D:  04/13/2010  T:  04/14/2010  Job:  [id number]  cc:   Lajuana Matte, MD Marjory Lies, M.D. Griffith Citron, M.D. [name], M.D.  Electronically Signed by Vida Rigger M.D. on 05/01/2010 07:59:58 AM

## 2010-05-02 NOTE — Consult Note (Signed)
  NAME:  [name], [name] NO.:  [id number]  MEDICAL RECORD NO.:  [id number]           PATIENT TYPE:  I  LOCATION:  3304                         FACILITY:  [facility]  PHYSICIAN:  Petra Kuba, M.D.    DATE OF BIRTH:  April 04, 1933  DATE OF CONSULTATION:  04/13/2010 DATE OF DISCHARGE:                                CONSULTATION   HISTORY:  The patient is seen at the request of the hospital team for iron deficiency.  He has had multiple colonoscopies in the past by Dr. Kinnie Scales and believes he has had an endoscopy in the past as well, although does not remember those findings.  The last one being done in May 2010 showing hyperplastic polyps.  He has also had one done in at least February 2002.  He does say he has had lots of chronic stomach issues, but other than his appendix has not had any surgeries and is on chronically Nexium, but does take both aspirin and Plavix.  He presented to the emergency room with symptomatic anemia, found to have a hemoglobin of 6.4 with an MCV of 58.  We are consulted for further workup and plans.  PAST MEDICAL HISTORY:  Pertinent for coronary artery disease, hypertension, lung cancer with lobectomy as well as a history of tobacco abuse and arthritis.  HOME MEDICATIONS:  He takes Celebrex, Imdur, Nexium, Plavix, Plendil, prednisone, Xanax, and aspirin.  ALLERGIES:  SULFA.  SOCIAL HISTORY:  Continues to smoke, but does not drink.  FAMILY HISTORY:  Pertinent for a sister with some nonspecific GI issues.  REVIEW OF SYSTEMS:  Negative except above.  He denies seeing any other obvious blood in his bowels.  Might have some constipation.  PHYSICAL EXAMINATION:  GENERAL:  No acute distress, lying comfortably in the bed. VITAL SIGNS:  Stable, afebrile. LUNGS:  Clear. HEART:  Regular rate and rhythm. ABDOMEN:  Soft and nontender.  BUN and creatinine normal.  After transfusion, hemoglobin is up to 8.8 with an white count of 7.4 and MCV up to  65, platelets 205.  PT was normal.  Liver tests were normal on admission.  Albumin 4.2.  Last hemoglobin on the chart was September 2011 which is pertinent for 10.6 with an MCV of 76.  ASSESSMENT: 1. Multiple medical problems. 2. Iron deficiency in a patient with some vague gastrointestinal     issues, at least 2 nondiagnostic colonoscopies, the last one     roughly 2 years ago, on aspirin, Plavix, and Celebrex, but also     taking Nexium.  PLAN:  The risks, benefits, and methods of endoscopy was discussed.  We will proceed today with further workup and plans pending those findings.          ______________________________ Petra Kuba, M.D.     MEM/MEDQ  D:  04/13/2010  T:  04/13/2010  Job:  [id number]  cc:   Griffith Citron, M.D.  Electronically Signed by Vida Rigger M.D. on 05/01/2010 07:59:55 AM

## 2010-05-08 LAB — PROTIME-INR
INR: 1 (ref 0.00–1.49)
Prothrombin Time: 13.4 seconds (ref 11.6–15.2)

## 2010-05-08 LAB — DIFFERENTIAL
Basophils Relative: 1 % (ref 0–1)
Eosinophils Absolute: 0.3 10*3/uL (ref 0.0–0.7)
Lymphs Abs: 1.8 10*3/uL (ref 0.7–4.0)
Neutro Abs: 3.7 10*3/uL (ref 1.7–7.7)
Neutrophils Relative %: 57 % (ref 43–77)

## 2010-05-08 LAB — CBC
HCT: 32.9 % — ABNORMAL LOW (ref 39.0–52.0)
HCT: 42.4 % (ref 39.0–52.0)
Hemoglobin: 10.6 g/dL — ABNORMAL LOW (ref 13.0–17.0)
Hemoglobin: 14 g/dL (ref 13.0–17.0)
MCH: 24.8 pg — ABNORMAL LOW (ref 26.0–34.0)
MCH: 25.6 pg — ABNORMAL LOW (ref 26.0–34.0)
MCH: 25.8 pg — ABNORMAL LOW (ref 26.0–34.0)
MCHC: 33 g/dL (ref 30.0–36.0)
MCV: 76.9 fL — ABNORMAL LOW (ref 78.0–100.0)
MCV: 77.5 fL — ABNORMAL LOW (ref 78.0–100.0)
MCV: 77.7 fL — ABNORMAL LOW (ref 78.0–100.0)
Platelets: 192 10*3/uL (ref 150–400)
RBC: 4.28 MIL/uL (ref 4.22–5.81)
RDW: 17 % — ABNORMAL HIGH (ref 11.5–15.5)
WBC: 5.8 10*3/uL (ref 4.0–10.5)

## 2010-05-08 LAB — BASIC METABOLIC PANEL
CO2: 26 mEq/L (ref 19–32)
Calcium: 9 mg/dL (ref 8.4–10.5)
Chloride: 98 mEq/L (ref 96–112)
Chloride: 101 mEq/L (ref 96–112)
Creatinine, Ser: 0.76 mg/dL (ref 0.4–1.5)
Creatinine, Ser: 0.66 mg/dL (ref 0.4–1.5)
GFR calc Af Amer: >60 mL/min (ref >60)
GFR calc Af Amer: >60 mL/min (ref >60)
Potassium: 3.7 mEq/L (ref 3.5–5.1)
Sodium: 132 mEq/L — ABNORMAL LOW (ref 135–145)
Sodium: 131 mEq/L — ABNORMAL LOW (ref 135–145)

## 2010-05-08 LAB — CARDIAC PANEL(CRET KIN+CKTOT+MB+TROPI)
CK, MB: 6.5 ng/mL (ref 0.3–4.0)
Relative Index: INVALID (ref 0.0–2.5)
Relative Index: INVALID (ref 0.0–2.5)
Total CK: 112 U/L (ref 7–232)
Total CK: 72 U/L (ref 7–232)
Troponin I: 0.48 ng/mL — ABNORMAL HIGH (ref 0.00–0.06)
Troponin I: 0.56 ng/mL (ref 0.00–0.06)

## 2010-05-08 LAB — COMPREHENSIVE METABOLIC PANEL
ALT: 11 U/L (ref 0–53)
BUN: 4 mg/dL — ABNORMAL LOW (ref 6–23)
CO2: 30 mEq/L (ref 19–32)
Calcium: 9.6 mg/dL (ref 8.4–10.5)
GFR calc non Af Amer: >60 mL/min (ref >60)
Glucose, Bld: 95 mg/dL (ref 70–99)
Sodium: 135 mEq/L (ref 135–145)
Total Protein: 7.8 g/dL (ref 6.0–8.3)

## 2010-05-08 LAB — LIPID PANEL
Cholesterol: 147 mg/dL (ref 0–200)
HDL: 42 mg/dL (ref >39)
LDL Cholesterol: 92 mg/dL (ref 0–99)
Total CHOL/HDL Ratio: 3.5 RATIO
Triglycerides: 67 mg/dL (ref <150)

## 2010-05-08 LAB — CK TOTAL AND CKMB (NOT AT ARMC)
CK, MB: 10.3 ng/mL (ref 0.3–4.0)
Relative Index: 9 — ABNORMAL HIGH (ref 0.0–2.5)

## 2010-05-08 LAB — HEPARIN LEVEL (UNFRACTIONATED): Heparin Unfractionated: 0.57 IU/mL (ref 0.30–0.70)

## 2010-05-08 LAB — POCT CARDIAC MARKERS
CKMB, poc: 7.4 ng/mL (ref 1.0–8.0)
Myoglobin, poc: 77.8 ng/mL (ref 12–200)

## 2010-05-08 LAB — TROPONIN I: Troponin I: 0.53 ng/mL (ref 0.00–0.06)

## 2010-05-08 LAB — APTT: aPTT: 36 seconds (ref 24–37)

## 2010-05-09 LAB — BASIC METABOLIC PANEL
BUN: 10 mg/dL (ref 6–23)
CO2: 26 mEq/L (ref 19–32)
Calcium: 10 mg/dL (ref 8.4–10.5)
Creatinine, Ser: 0.7 mg/dL (ref 0.4–1.5)
GFR calc non Af Amer: >60 mL/min (ref >60)
Glucose, Bld: 114 mg/dL — ABNORMAL HIGH (ref 70–99)

## 2010-05-09 LAB — CBC
MCH: 26.7 pg (ref 26.0–34.0)
MCHC: 33.2 g/dL (ref 30.0–36.0)
RDW: 16.7 % — ABNORMAL HIGH (ref 11.5–15.5)

## 2010-05-09 LAB — DIFFERENTIAL
Basophils Absolute: 0 10*3/uL (ref 0.0–0.1)
Basophils Relative: 1 % (ref 0–1)
Eosinophils Absolute: 0.1 10*3/uL (ref 0.0–0.7)
Neutro Abs: 5.9 10*3/uL (ref 1.7–7.7)
Neutrophils Relative %: 76 % (ref 43–77)

## 2010-05-09 NOTE — Discharge Summary (Signed)
[name], [name] NO.:  [id number]  MEDICAL RECORD NO.:  [id number]           PATIENT TYPE:  I  LOCATION:  3304                         FACILITY:  [facility]  PHYSICIAN:  Zannie Cove, MD     DATE OF BIRTH:  Mar 24, 1933  DATE OF ADMISSION:  04/12/2010 DATE OF DISCHARGE:  04/14/2010                              DISCHARGE SUMMARY   PRIMARY CARE PHYSICIAN:  Dr. Christell Constant.  GASTROENTEROLOGIST:  Griffith Citron, MD.  DISCHARGE DIAGNOSES: 1. Chronic blood loss anemia. 2. Minimal gastritis, on EGD. 3. Iron-deficiency anemia. 4. History of CAD with drug-eluting stent to the RCA in 2006. 5. Severe two-vessel coronary disease. 6. History of non-small cell lung cancer. 7. Tobacco use. 8. Osteoarthritis.  DISCHARGE MEDICATIONS: 1. MiraLax 17 grams p.o. daily p.r.n. 2. Imdur 60 mg p.o. b.i.d. 3. Nexium 40 mg daily. 4. Plavix 75 mg daily. 5. Plendil 5 mg p.o. daily. 6. Ramipril 5 mg p.o. daily. 7. Discontinued medications include enteric-coated aspirin 325 daily     and Celebrex 200 mg daily.  CONSULTANTS:  Dr. Ewing Schlein, [facility].  PROCEDURES:  EGD, which showed small hiatal hernia, otherwise normal EGD, minimal gastritis.  LABORATORY DATA:  Other Investigations include CT of abdomen and pelvis showed stable loculated left pleural effusion, coronary aortic and iliofemoral arterial calcification.  No new mass or adenopathy.  HOSPITAL COURSE:  Mr. Cupps is a 77 year old gentleman with multiple medical problems, presented to his PCP with weakness on evaluation.  He was found to have a hemoglobin of 6.4 and for this, he was asked to come to the ER for further evaluation. 1. Microcytic anemia.  His Hemoccult was found to be tracely positive,     hence felt to have chronic blood loss anemia.  The patient reported     having a colonoscopy few years prior per Dr. Kinnie Scales, which was     reportedly normal.  Hence was seen by Dr. Ewing Schlein from [facility] in     consultation  since because of it being the weekend and inability to     contact Dr. Kinnie Scales.  Essentially, he had an unremarkable EGD and     hence it is felt that the patient was stable to be discharged home     on just Plavix because of his severe two-vessel CAD and follow up     with Dr. Kinnie Scales for colonoscopy.  Rest of the patient's medical     problems remained stable.  He did require a total of 3 units PRBC     transfusion on his first day of admission.  Since then his     hemoglobin was stable in mid 8 to 9.5 range.  At the time of     discharge, he is also given prescription for iron to build up his     iron stores due to very low ferritin indicating very poor iron     stores of 17, total iron of 12% saturation also being very low at     3.  DISCHARGE FOLLOWUP:  Followup with Dr. Kinnie Scales in 1-2 weeks.  Zannie Cove, MD     PJ/MEDQ  D:  05/08/2010  T:  05/08/2010  Job:  [id number]  cc:   Griffith Citron, M.D.  Electronically Signed by Zannie Cove  on 05/09/2010 11:21:52 AM

## 2010-07-08 NOTE — H&P (Signed)
NAME:  [name], [name] NO.:  [id number]   MEDICAL RECORD NO.:  [id number]          PATIENT TYPE:  EMS   LOCATION:  MAJO                         FACILITY:  [facility]   PHYSICIAN:  [name], M.D.  DATE OF BIRTH:  Nov 08, 1933   DATE OF ADMISSION:  12/17/2006  DATE OF DISCHARGE:  12/17/2006                              HISTORY & PHYSICAL   HISTORY OF PRESENT ILLNESS:  Mr. [name] is a 77 year old white male with  history of coronary disease, lung cancer and peripheral vascular disease  who presents to emergency room tonight for evaluation of chest pain.  Pain started at midnight last night.  It was described as a heavy  pressure in his anterior chest.  It was non-radiating.  He had no  associated shortness of breath.  Initially was relieved with belching,  and he described a burning sensation, but his pain never completely  resolved.  He was afraid to take nitroglycerin, due to the fact that he  took Cialis last night.  EMS was called this morning.  The patient did  receive aspirin, sublingual nitroglycerin, without relief.  He did  receive morphine in the emergency department with good relief, although  he still has a very low-grade pressure sensation in his chest.  The  patient does describe a 5-day history of severe sinus pressure and  drainage of greenish nasal secretions.  He has had some cough that has  been non-productive.  Of note, the patient is status post left upper  lobe lobectomy in October 2007 for a large non-small cell tumor.  He had  a CT scan in September 24, 2006 which was stable.  He has also had a history  of coronary disease and is status post stenting of tandem lesions in the  left circumflex coronary in February 2006.  Cardiac catheterization in  January 2008 showed only non-obstructive disease.   PAST MEDICAL HISTORY:  1. Coronary disease status post stenting of the left circumflex      coronary in 2006.  2. Lung carcinoma status post left upper  lobe lobectomy, non-small      cell CA. T2, N0, M0.  3. History of atrial fibrillation, resolved.  4. PVCs.  5. Peripheral vascular disease.  6. Hypertension.  7. Hyperlipidemia.  8. COPD.  9. Anemia full for that wall and there is defect in the and BNP reason      for her anemia.   MEDICATIONS:  1. Aspirin 325 mg per day.  2. Plendil 5 mg per day.  3. Nexium 40 mg per day.  4. Plavix 75 mg per day.  5. Toprol XL 25 mg per day.  6. Oxycodone p.r.n.  7. Nitroglycerin p.r.n.   ALLERGIES:  HE IS ALLERGIC TO SULFA.   SOCIAL HISTORY:  He is married.  He has two children.  He denies alcohol  use.  He quit smoking in October 2007.   FAMILY HISTORY:  Father died of myocardial infarction at age 51.  One  brother has a pacemaker.   REVIEW OF SYSTEMS:  Are as noted in HPI, otherwise  negative.   PHYSICAL EXAM:  GENERAL:  He is well-developed white male in no apparent  distress.  VITAL SIGNS:  Blood pressure 111/62, pulse is 54, in sinus rhythm.  He  is afebrile.  Sats are 100% on 2 liters nasal cannula.  HEENT:  Exam is  unremarkable.  He has no adenopathy, JVD or bruits.  LUNGS:  Clear bilaterally.  CARDIAC:  Exam reveals regular rate and rhythm without gallop, murmur,  rub or click.  ABDOMEN:  Soft, nontender.  His bowel sounds are positive.  He has no  lower extremity edema or phlebitis.  NEUROLOGIC:  Exam is intact.   LABORATORY DATA:  Hemoglobin is 14.3, sodium is 132, potassium 4.3,  chloride 99, CO2 27, BUN 10, creatinine 0.6, glucose of 118, CK-MB is  2.4, troponin is less than 0.05, and ECG shows sinus bradycardia and is  normal.  His chest x-ray shows increased opacification of the left lung  compared with an x-ray in May of 2008.  There is a question of whether  this is a layering effusion versus infection.   IMPRESSION:  1. Chest pain with atypical features.  I doubt this is ischemic in      origin, given the lack of ECG changes and normal enzymes despite      over  11 hours of chest pain.  The patient did have a unremarkable      cardiac catheterization in January of this year.  Need to rule out      primary pulmonary process, or it is quite possible this could just      be acid reflux disease.  2. History of left upper lobe lobectomy for T2, M0, N0 non-small cell      lung carcinoma, now some increased opacification of the left lung      on chest x-ray.  3. Acute sinusitis.  4. Coronary disease status post stenting of left circumflex coronary.   PLAN:  Will obtain a CT of the chest today.  If this is stable compared  to August 2008, will plan on discharging the patient home on Avelox 400  mg daily for 7 days for treatment of his sinusitis and will continue on  his Nexium.           ______________________________  [name], M.D.     PMJ/MEDQ  D:  12/17/2006  T:  12/18/2006  Job:  [id number]   cc:   [name], M.D.  Lajuana Matte, MD  Evelena Peat, M.D.

## 2010-07-08 NOTE — Assessment & Plan Note (Signed)
OFFICE VISIT   ASHFORD, CLOUSE  DOB:  24-Jan-1934                                        April 20, 2007  CHART #:  [id number]   I saw the patient back today and got a CT scan that shows no evidence of  recurrence of his cancer.  His blood pressure is 128/78, pulse 60,  respirations 18, saturation 98%.  There was a small loculated fluid at  the base of the left lung, but no other areas.  Will continue to watch  with CT scan.  I plan to see him back in 6 months and repeat another  one.  His only complaint today was back pain and he will be seeing Dr.  Danielle Dess for that.   [name], M.D.  Electronically Signed   DPB/MEDQ  D:  04/20/2007  T:  04/21/2007  Job:  [id number]   cc:   Lajuana Matte, MD

## 2010-07-08 NOTE — Letter (Signed)
October 26, 2007   [name], MD  [phone] [address]  [place], [place] [address]   Re:  [name], [name]                DOB:  18-Nov-1933   Dear [name]:   I saw the patient back today.  His main complaint was pain in his left  leg and swelling in his left knee.  His CT scan was stable at 6 months,  showed no evidence of recurrence of cancer.  There is a 4-mm nodule that  they saw on the right.  He is now over 2 years since his surgery.  I  will see him again in 6 months and repeat his CT scan.  Since, so far,  he has had no recurrences despite having a 9-cm lesion.  His blood  pressure was 149/82, pulse 82, respirations 18, and sats were 96%.   [name], M.D.  Electronically Signed   DPB/MEDQ  D:  10/26/2007  T:  10/27/2007  Job:  [id number]   cc:   Lajuana Matte, MD  Evelena Peat, M.D.

## 2010-07-08 NOTE — Letter (Signed)
October 06, 2006   [name], MD.  [phone] [address]  [address]  [place], [place], [address]   Re:  [name], [name]                DOB:  [birth year]   Dear [name]:   I saw [name] back in the office today.  He will be seen in the near  future.  His blood pressure was 146/72, pulse 78, respirations 18, and  SATs were 98%.  I did a CT scan now approximately 10 months since his  surgery, and it shows no evidence of recurrence of his cancer, which is  remarkable given the size of the cancer he had.  I will plan to see him  again in six months with another CT scan, but [name] is actually  doing remarkably well.  His only problem is his pain in his leg, which I  think is secondary to a neuropathy.  I appreciate the opportunity of  taking care of [name].   [name], M.D.  Electronically Signed   DPB/MEDQ  D:  10/06/2006  T:  10/07/2006  Job:  [id number]   cc:   Lajuana Matte, MD

## 2010-07-08 NOTE — Letter (Signed)
December 10, 2009   [name], MD  [phone] [address], [address]  [place], [place] [address]   Re:  Pedro, Willis                DOB:  Apr 28, 1933   Dear [name],   I saw the patient back in the office today.  Her blood pressure was  116/61, pulse 81, respirations 18, sats were 97%.  He is 4 years since  we did a left lower lobectomy for a large lung cancer.  A CT scan today  showed no evidence of recurrence.  He is doing well overall  and will  see him again in 1 year with another CT scan, hopefully the last CT  scan.   [name], M.D.  Electronically Signed   DPB/MEDQ  D:  12/10/2009  T:  12/11/2009  Job:  [id number]

## 2010-07-08 NOTE — Discharge Summary (Signed)
NAMEJERRELL, [name] NO.:  [id number]   MEDICAL RECORD NO.:  [id number]          PATIENT TYPE:  INP   LOCATION:  3740                         FACILITY:  [facility]   PHYSICIAN:  [name], M.D.  DATE OF BIRTH:  January 27, 1934   DATE OF ADMISSION:  12/18/2006  DATE OF DISCHARGE:  12/21/2006                               DISCHARGE SUMMARY   HISTORY OF PRESENT ILLNESS:  [name] is a 77 year old white male  with multiple medical problems, who presented with recurrent and  refractory chest pain.  He was seen in the emergency department on  December 17, 2006.  At that time he had no significant ECG changes or  cardiac enzyme changes.  He had an normal unremarkable CT of his chest.  He was treated for acute sinus infection and discharged, however  returned the following day with recurrent chest burning and  palpitations.  He was noted to have paroxysmal supraventricular  tachycardia, which resolved in the emergency department.  The patient  was admitted for further evaluation.  Of note, the patient does have  history of coronary disease and has had prior stenting of the left  circumflex coronary in 2006.  He had a cardiac catheterization in  January 2008, which showed no significant obstructive disease.  He has  had a history of lung cancer, status post left upper lobe lobectomy for  a T2 M0 N0 non-small-cell carcinoma.  He reports he has had a endoscopy  by Dr. Kinnie Scales approximately a year ago and that no significant pathology  was found.   For details of his past medical history, social history, family history  and physical exam, please see admission history and physical.   LABORATORY DATA:  His ECG initially showed a supraventricular  tachycardia with a rate of 165.  This subsequently converted to sinus  rhythm, which showed normal sinus rhythm, nonspecific interventricular  conduction delay.  There were no acute ST or T-wave changes.  His chest  x-ray showed some  mild free air in the pleural space consistent with  prior CT findings.  This was discussed with Dr. 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  It was felt  that this required no further therapy.  The patient had serial cardiac  enzymes.  His CPK and MBs were all normal.  His troponin was slightly  elevated at 0.08 and then returned to normal.  He had mild hyponatremia  with a sodium of 130.  Renal indices were normal.  Hemoglobin was 11.9.  Coagulation studies were normal.   HOSPITAL COURSE:  The patient was admitted to a telemetry bed.  His  Toprol dose was increased but has we have seen in the past, this  resulted in marked bradycardia with heart rate down in the 40s so we  recommended continuing him on just 25 mg per day.  The patient also  reported a history of intolerance to statins, both Zocor and Lipitor or  the past, due to  rash.  This will need to be reviewed in his records as  an outpatient and decide alternative treatment.  His Nexium dose was  increased to twice a day.  He was continued on Avelox for his recent  sinus infection.  On December 20, 2006, he underwent cardiac  catheterization.  This demonstrated nonobstructive disease in the left  coronary system.  In the distal right coronary there was a focal 60-70%  stenosis that was unchanged compared to January 2008.  Left ventricular  function was normal.  Based on these findings, we recommended we  continue him on medical therapy, and he was discharged to home on  December 21, 2006.  He did have some recurrent chest burning the night  prior to discharge but, again, this was felt to be more esophageal in  origin and we recommended continuing on the twice-daily  Nexium.   DISCHARGE DIAGNOSES:  1. Atypical chest pain, noncardiac.  2. Coronary disease, status post stenting of the circumflex coronary.  3. History of  non-small-cell lung cancer, status post left upper lobe      lobectomy.  4. Hypertension.  5. Paroxysmal supraventricular tachycardia, resolved.   DISCHARGE MEDICATIONS:  1. Nexium 40 mg twice a day.  2. Plavix 75 mg daily.  3. Aspirin 81 mg per day.  4. Toprol XL 25 mg per day.  5. Oxycodone p.r.n.  6. Plendil 5 mg daily.  7. Nitrostat p.r.n.  8. Avelox 400 mg daily to complete his full 10-day course.  9. The patient was also given Colace 100 mg daily for constipation.   He will follow up with Dr. Swaziland in 2 weeks.   DISCHARGE STATUS:  Improved.           ______________________________  [name], M.D.     PMJ/MEDQ  D:  12/21/2006  T:  12/21/2006  Job:  [id number]   cc:   [name], M.D.  Marjory Lies, M.D.  Lajuana Matte, MD  Griffith Citron, M.D.

## 2010-07-08 NOTE — Cardiovascular Report (Signed)
NAMECHUONG, CASEBEER NO.:  [id number]   MEDICAL RECORD NO.:  [id number]          PATIENT TYPE:  INP   LOCATION:  75                         FACILITY:  [facility]   PHYSICIAN:  [name], M.D.  DATE OF BIRTH:  1933/10/04   DATE OF PROCEDURE:  12/20/2006  DATE OF DISCHARGE:                            CARDIAC CATHETERIZATION   INDICATION FOR PROCEDURE:  The patient is a 77 year old white male with  a known history of coronary disease status post prior stenting of the  left circumflex coronary artery in 2006.  He presents with refractory  chest pain symptoms that are atypical.  CT of the chest was negative for  acute problems.  Because of his ongoing chest pain, cardiac  catheterization was recommended.   ACCESS:  Access was via the right femoral artery using the standard  Seldinger technique.   EQUIPMENT USED:  6-French 4 cm right and left Judkins catheter, 6-French  pigtail catheter, 6-French arterial sheath.   MEDICATIONS:  Local anesthesia 1% Xylocaine, Versed 1 mg IV, fentanyl 25  mcg IV, contrast 120 cc of Omnipaque.   HEMODYNAMIC DATA:  Aortic pressure was 129/55 with a mean of 81.  Left  ventricle pressure was 131 with an EDP of 15 mmHg.   The end of the procedure, the right groin was closed using an Angio-Seal  device with excellent hemostasis.   ANGIOGRAPHIC DATA:  The left coronary arises and distributes normally.  The left main coronary has moderate calcification with only minimal  irregularities.   The LAD has moderate to severe calcification of the proximal vessel.  There is 20% narrowing in the proximal vessel with approximately 30%  narrowing after the takeoff of the first septal perforator.   The left circumflex coronary artery has stents in the mid and distal  vessel that are widely patent.  There is no significant obstructive  disease noted with only mild irregularities.  This includes the ramus  intermediate branch of the first and  second marginal branches.   The right coronary is a dominant vessel.  It has moderate irregularities  in the proximal mid vessel up to 30%.  In the distal vessel there is a  focal 60-70% stenosis.  The PDA and posterolateral branches show only  minor irregularities.   Left ventricular angiography was performed in the RAO view.  This  demonstrates normal left ventricular size and contractility with normal  systolic function.  Ejection fraction estimated at 60%.   FINAL IMPRESSION:  1. Borderline obstructive disease in the distal right coronary.  It is      unlikely that this lesion is causing his acute      chest pain.  Comparison with prior cardiac catheterization of      March 20, 2006 shows no significant change.  2. Normal left ventricular function.   PLAN:  Would recommend continued medical therapy.           ______________________________  [name], M.D.     PMJ/MEDQ  D:  12/20/2006  T:  12/20/2006  Job:  [id number]   cc:  [name], M.D.  Lajuana Matte, MD  Marjory Lies, M.D.

## 2010-07-08 NOTE — Procedures (Signed)
LOWER EXTREMITY ARTERIAL DUPLEX   INDICATION:  Right lower extremity claudication.   HISTORY:  Diabetes:  No.  Cardiac:  MI in 2011.  Hypertension:  Yes.  Smoking:  Yes.  Previous Surgery:  Stent placement.  Other:  Hyperlipidemia.   SINGLE LEVEL ARTERIAL EXAM                          RIGHT                LEFT  Brachial:               141                  134  Anterior tibial:        105                  135  Posterior tibial:       95                   127  Peroneal:  Ankle/Brachial Index:   0.74                 0.96   LOWER EXTREMITY ARTERIAL DUPLEX EXAM   DUPLEX:  1. Patent right arterial system.  2. Right arterial waveforms are monophasic.  3. Increased velocity of 624 cm/s was noted at the level of the mid      thigh SFA.   IMPRESSION:  1. Moderate right ankle brachial indices with monophasic waveforms.  2. Increased velocity noted in the mid thigh superficial femoral      artery, as mentioned above.  3. Left ankle brachial index within normal limits.   ___________________________________________  Larina Earthly, M.D.   EM/MEDQ  D:  03/12/2010  T:  03/12/2010  Job:  [id number]

## 2010-07-08 NOTE — Letter (Signed)
November 07, 2008   [name], MD  [phone] [address], [address]  [place], [place] [address]   Re:  [name], [name]                DOB:  Nov 20, 1933   Dear [name]:   I saw the patient back today.  Though he continues to smoke, his CT scan  was negative for recurrence now almost 3 years since his surgery which  is really good since he had such a large tumor to begin with.  We will  see him back again in 6 months with his chest x-ray.  His lungs were  clear to auscultation and percussion.  His blood pressure was 135/82,  pulse 74, respirations 18, and sats were 96%.   Sincerely,   [name], M.D.  Electronically Signed   DPB/MEDQ  D:  11/07/2008  T:  11/07/2008  Job:  [id number]

## 2010-07-08 NOTE — Discharge Summary (Signed)
NAMEANSEN, SAYEGH NO.:  [id number]   MEDICAL RECORD NO.:  [id number]          PATIENT TYPE:  INP   LOCATION:  3740                         FACILITY:  [facility]   PHYSICIAN:  [name], M.D.  DATE OF BIRTH:  11-06-1933   DATE OF ADMISSION:  12/18/2006  DATE OF DISCHARGE:  12/19/2006                               DISCHARGE SUMMARY   HISTORY OF PRESENT ILLNESS:  Mr. Rushing is a very pleasant 77 year old  white male with known history of coronary artery disease.   Dictation ended at this point.  Dictator cancelled dictation.           ______________________________  [name], M.D.     PMJ/MEDQ  D:  12/19/2006  T:  12/20/2006  Job:  [id number]

## 2010-07-08 NOTE — H&P (Signed)
NAMECOULTON, SCHLINK NO.:  [id number]   MEDICAL RECORD NO.:  [id number]          PATIENT TYPE:  INP   LOCATION:  3740                         FACILITY:  [facility]   PHYSICIAN:  Lyn Records, M.D.   DATE OF BIRTH:  [birth year]   DATE OF ADMISSION:  12/18/2006  DATE OF DISCHARGE:                              HISTORY & PHYSICAL   REASON FOR ADMISSION:  PSVT with prolonged chest discomfort.   SUBJECTIVE:  Mr. Moffatt is 77 years of age and has significant medical  problems including a history of squamous cell lung cancer for which he  is status post left upper lobectomy and chemotherapy in 2007.  There is  a coexistent history of coronary artery disease.  The patient has had  Taxus stents placed in the circumflex by Dr. Swaziland in 2006.  The  patient presents on this occasion with prolonged chest tightness that he  states started at around 4:00 a.m. today.  He got out of bed, took a  Nexium and his metoprolol that he had missed the previous day, and went  back to bed.  He got up again around 10 o'clock and noticed that his  chest was still hurting and he had the sense that his heart was racing.  He got out of bed, became very weak and nearly fainted.  He was brought  to the emergency room by EMS.  Initial EMS EKG demonstrated SVT with a  rate of around 165.  The patient had come to the emergency room on  December 17, 2006, with chest heaviness.  An x-ray was done.  A new  abnormality on chest x-ray was identified.  It was felt that this could  represent pneumonia, and Avelox was started.  Yesterday, the patient had  no such tachypalpitation as it has accompanied today's admission.  He is  currently totally asymptomatic.  The rapid heart rate spontaneously  reverted after the patient arrived in the emergency room.  He denies any  residual complaints.   His usual medications include:  1. Plavix 75 mg per day.  2. Plendil 5 mg per day.  3. Metoprolol XL 25 mg per day.  4. Aspirin one per day.   ALLERGIES:  SULFA.   SIGNIFICANT PAST MEDICAL HISTORY:  1. Squamous cell lung cancer.  2. Hypertension.  3. Coronary atherosclerosis.  4. Lung cancer.   FAMILY HISTORY:  Positive for heart and peripheral vascular disease.   SOCIAL HISTORY:  He is married, has children.  He denies ethanol  consumption and probably still smokes cigarettes, although he denies it.   REVIEW OF SYSTEMS:  No specific complaints.  The patient's appetite is  improving after chemotherapy the year and a half or so ago.  No blood in  his urine or stool.   OBJECTIVE:  VITAL SIGNS:  The patient's blood pressure is 110/70 and  heart rate is 70.  SKIN:  Clear.  HEENT:  Unremarkable.  No JVD or carotid bruit.  No thyromegaly.  LUNGS:  Crackles at the bases.  CARDIAC:  No rub, no click, no gallop, and  no murmur.  ABDOMEN:  Soft.  Bowel sounds are normal.  EXTREMITIES:  Reveal no edema.  Posterior tibial and radial pulses are  2+.  NEUROLOGIC:  Unremarkable.   EKG No. 1 demonstrates vertical access with PSVT at a rate of 170 and  lateral precordial ST segment depression.  EKG No. 2 demonstrates normal  sinus rhythm in vertical axis and basically overall normal appearance,  otherwise.  There may be mild increase of the ventricular conduction  duration.   ASSESSMENT:  1. Prolonged chest pain, likely precipitated by paroxysmal      supraventricular tachycardia, prolong.  Rule out myocardial      infarction.  2. Underlying history of coronary artery disease with a history of      Taxus stents in the circumflex coronary artery by Dr. Swaziland in      2006.  3. Squamous cell carcinoma of the lung, status post resection and      chemotherapy.   PLAN:  1. Aspirin.  2. Plavix.  3. Statin therapy.  4. Lovenox.  5. Serial markers to rule out MI.  6. Increase beta-blocker dose to suppress recurrent SVT.  7. Further management per Dr. Swaziland.      Lyn Records, M.D.   Electronically Signed     HWS/MEDQ  D:  12/18/2006  T:  12/18/2006  Job:  [id number]   cc:   [name], M.D.

## 2010-07-08 NOTE — Assessment & Plan Note (Signed)
OFFICE VISIT   [name], [name]  DOB:  [birth year]                                        May 08, 2009  CHART #:  [id number]   The patient returns today.  He is now 3-1/2 years since his surgery.  His blood pressure is 141/84, pulse 68, respirations 18, saturations  were 98%.  His incisions are well healed.  Chest x-ray showed no  evidence of recurrence.  We will see him back again in October with a CT  scan.   [name], M.D.  Electronically Signed   DPB/MEDQ  D:  05/08/2009  T:  05/09/2009  Job:  [id number]

## 2010-07-11 NOTE — Op Note (Signed)
[name], [name] NO.:  [id number]   MEDICAL RECORD NO.:  [id number]          PATIENT TYPE:  INP   LOCATION:  2301                         FACILITY:  [facility]   PHYSICIAN:  [name], M.D. DATE OF BIRTH:  1933/08/02   DATE OF PROCEDURE:  12/01/2005  DATE OF DISCHARGE:                                 OPERATIVE REPORT   PREOPERATIVE DIAGNOSIS:  Nonsmall cell lung cancer, left upper lobe.   POSTOPERATIVE DIAGNOSIS:  Nonsmall cell lung cancer, left upper lobe.   OPERATIONS PERFORMED:  Left video-assisted thoracoscopic surgery, left  thoracotomy, left upper lobectomy, with lymph node dissection.   SURGEON:  [name], M.D.   FIRST ASSISTANT:  Jerold Coombe, P.A.-C.   DESCRIPTION OF PROCEDURE:  After percutaneous insertion of all monitoring  lines, the patient was turned to the left lateral thoracotomy position and  was prepped and draped in the usual sterile manner.  Two trocar sites were  made in the anterior and posterior axillary lines in the seventh intercostal  space.  Two trocars were inserted.  A 0-degree scope was inserted and a  lesion was seen in the anterior lingular area.  The left upper lobe was  stuck to the chest wall.  A posterolateral thoracotomy incision was made  over the fifth intercostal space.  The latissimus was divided.  The serratus  was reflected anteriorly and a Finochietto retractor was inserted.  A  portion of the sixth rib was taken subperiosteally at the angle.  A Tuffier  was placed at right angles.  This exposed the left upper lobe tumor, which  was stuck to the chest wall.  This was taken off the chest wall, taking a  pleural margin, which was sent for frozen section, and that was negative.  We then started taking down several other adhesions from the chest wall.  Dissection was started anteriorly, dissecting out the superior pulmonary  vein and looping it with a vascular tape.  Then, we started dissecting  along  the pulmonary artery, dissecting this out, and attention was then turned to  the fissure.  The superior portion of the fissure was divided with the  [facility] stapler.  A small lingular branch was doubly ligated and divided.  The patient really had a very large, free left upper lobe that apically and  posteriorly came off in a common trunk, similar to the right upper lobe  apical, posterior branches, but no continuation with a continuation branch  to the lower lobe.  There was a marked amount of lymph nodes around the main  pulmonary artery, and 5 lymph nodes were resected free.  And then we  dissected up along the main pulmonary artery, dissecting 10L nodes.  We then  dissected medially and dissected more 10L nodes.  The rest of the fissure  was divided with the Autosuture 30 white Roticulator stapler, and the  superior pulmonary vein was divided with the Autosuture 45 white  Roticulator.  Several more 11L and 10L nodes were dissected free from around  the bronchus, and we were finally able to expose  the apical anterior branch,  which was ligated with 0 silk proximally and distally and divided.  This  exposed the apical posterior branch, which was doubly ligated with #1 silk  and then divided.  After all branches had been divided, several more 10L  nodes were taken from around the bronchus.  The bronchus was stapled with a  TL-30 stapler, divided distally.  The left upper lobe was removed.  The  bronchial margins were negative, and it was a nonsmall cell squamous cell  cancer.  All bleeding was electrocoagulated.  No leak was seen from the  bronchial margins, and this was checked under water.  Two chest tubes were  brought through the trocar sites and tied in place with 0 silk.  A Marcaine  block was done in the usual fashion.  The On-Q was done in the usual  fashion.  There was a single On-Q put in above the ribs and secured in place  with Steri-Strips.  Finally, 4 drill holes  were placed through the sixth rib  and the ribs were closed with 4 paracostals going through the drill holes  and going  around the superior portion of the rib.  The chest was closed with running  #1 Vicryl in the muscle layer, 2-0 Vicryl in the subcutaneous tissue, and  Ethicon skin clips.  The patient was returned to the recovery room in stable  condition.           ______________________________  [name], M.D.     DPB/MEDQ  D:  12/01/2005  T:  12/01/2005  Job:  [id number]   cc:   Dorita Sciara, MD  [name], M.D.

## 2010-07-11 NOTE — Discharge Summary (Signed)
NAMEKIRE, FERG NO.:  [id number]   MEDICAL RECORD NO.:  [id number]          PATIENT TYPE:  INP   LOCATION:  3707                         FACILITY:  [facility]   PHYSICIAN:  [name], M.D.  DATE OF BIRTH:  [birth year]   DATE OF ADMISSION:  03/20/2006  DATE OF DISCHARGE:                               DISCHARGE SUMMARY   HISTORY OF PRESENT ILLNESS:  [name] is a 77 year old white male  with history of coronary disease who presented to emergency room on  March 20, 2006, with complaint of substernal chest pain.  He initially  awoke at 2:00 a.m. with chest pain radiating up into his neck.  It  persisted throughout the morning associated with some diaphoresis and  shortness of breath.  He took 2 old nitroglycerin tablets without  relief.  He did not present to emergency room until 5:15 p.m..  His  initial ECG in the emergency department at [facility]  demonstrated mild ST-segment elevation in the inferior leads.  The  patient was transferred to [facility] for acute evaluation.  The patient does have a prior cardiac history.  He has had prior  stenting of the left circumflex coronary x2 in February 2006.  He also  has a history of lung cancer and is status post left upper lobe  lobectomy he had received two courses of chemotherapy over a month ago.  For details of his past medical history, social history, family history,  physical exam please see admission history and physical.   LABORATORY DATA:  His chest x-ray showed postsurgical changes from his  lobectomy.  He had left pleural effusion.  His initial ECG did show  subtle ST elevation inferiorly.  On initial EKG, he was in sinus rhythm.  He subsequently developed atrial flutter and fibrillation.  Additional  laboratory data:  Cholesterol is 124, triglycerides 51, HDL of 45, LDL  of +69 and white count 6600, hemoglobin 9.3, hematocrit 29.1 platelets  322,000.  Sodium is 134, potassium  3.8, chloride 102, CO2 27, glucose of  145, BUN of 6, creatinine 0.54, calcium was 9.0.  Initial CK was 38 with  1.4 MB, troponin 0.02.  Subsequent CPK MB's and troponins were all  negative   HOSPITAL COURSE:  The patient was initially taken emergently to the  cardiac catheterization laboratory.  This demonstrated calcification of  the LAD without significant obstructive disease.  The left circumflex  coronary was widely patent in the previously stented site in the mid and  distal vessels.  The right coronary was dominant vessel.  In the distal  vessel.  There is a 50% stenosis prior to the takeoff of large PDA.  There were no evidence of thrombus or distal cutoff.  Left ventricular  function appeared to be normal and ejection fraction of 55%.  His left  ventricular filling pressures were low.  Initially, the patient was  treated with aspirin, Plavix IV, Integrilin and beta blocker therapy.  With the development of atrial fibrillation, he was placed on IV  amiodarone and then switched to p.o.  Subsequent  cardiac enzymes as  noted were all negative and follow-up ECG showed no ST elevation, but  his atrial fibrillation.  Therefore it was unclear whether this  represented unstable anginal or there was certainly no evidence of  myocardial infarction.  Did have a follow-up echocardiogram which showed  normal left ventricular function.  He had no pericardial effusion.  He  was noted to have a moderate left pleural effusion.  His Integrilin and  nitroglycerin drip were subsequently discontinued.  He was switched to  oral amiodarone.  He did convert to sinus rhythm and remained in sinus  rhythm throughout the remainder of his hospital stay.  He did have a 4-  1/2 second pause, but he converted to sinus rhythm from atrial  fibrillation.  Initially, his hemoglobin dropped to 8.3, but on follow-  up the next day it increased to 9.1 which was at his baseline.  The  patient had no subsequent chest  pain during his hospital stay, and it  was felt that he was stable for discharge on March 22, 2006.   DISCHARGE DIAGNOSIS:  1. Chest pain, no evidence of myocardial infarction by cardiac enzymes      with LV function.  Questions whether this represented some      spontaneous reperfusion verses noncardiac chest pain.  2. Atrial fibrillation converted to sinus rhythm.  3. Chronic PVCs.  4. History of lung cancer status post left upper lobe lobectomy and      previous chemotherapy.  5. Chronic anemia.  6. Coronary disease status post stenting of the left circumflex      coronary with continued patency   DISCHARGE MEDICATIONS:  1. Plavix 75 mg per day.  2. Coated aspirin 325 mg per day.  3. Toprol XL 25 mg per day.  4. Plendil 5 mg per day.  5. Niferex 150 mg daily.  6. Nitroglycerin 0.4 mg sublingual p.r.n.  7. Amiodarone 200 mg per day.  8. Crestor 110 mg per day.  9. The patient reports previous achieved and knee pain on Lipitor.   FOLLOWUP:  The patient will follow up with Dr. Swaziland in 3 weeks.   DISCHARGE STATUS:  Improved.           ______________________________  [name], M.D.     PMJ/MEDQ  D:  03/23/2006  T:  03/23/2006  Job:  [id number]   cc:   Evelena Peat, M.D.  Lajuana Matte, MD  [name], M.D.

## 2010-07-11 NOTE — Procedures (Signed)
[facility]  Patient:    [name]                        MRN: [id number] Proc. Date: 09/23/99 Adm. Date:  [id number] Attending:  Deneen Harts CC:         Teena Irani. Arlyce Dice, M.D.   Procedure Report  PROCEDURE PERFORMED:  Panendoscopy.  ENDOSCOPIST:  Griffith Citron, M.D.  INDICATIONS FOR PROCEDURE:  The patient is a 77 year old white male with complaints of epigastric pain.  Patient with prior history of gastritis.  Last examined over two years ago at which time moderate gastritis, thought to be NSAID-induced was present.  Also with mild duodenitis.  The patient had been treated with Prevacid which did not appear to ameliorate symptoms.  More recently, Nexium with some improvement. Undergoing endoscopy to evaluate possible upper GI pathology.  DESCRIPTION OF PROCEDURE:  After reviewing the nature of the procedure with the patient including potential risks and complications, and after discussing alternative methods of diagnosis and treatment, informed consent was signed.  The patient was premedicated receiving IV sedation totaling Versed 5 mg, fentanyl 50 mcg IV.  Using an Olympus video endoscope, the proximal esophagus was intubated.  Under direct vision the oropharynx was normal without lesion of the epiglottis, vocal cords or piriform sinus.  The proximal, mid and distal segments of the esophagus were entirely normal without evidence of mucosal inflammation, infiltration or other pathology.  Distinct mucosal Z-line at 45 cm.  No hiatal hernia.  Gastric fundus, body and antrum were normal.  No evidence of gastritis as had been seen previously.  No erosion, ulceration or inflammation.  Pylorus symmetric, easily traversed.  The duodenal bulb and second portion were entirely normal.  Retroflex view of the angularis, lesser curve, gastric cardia and fundus were entirely normal.  The stomach was decompressed, scope withdrawn.  The  patient tolerated the procedure without difficulty being maintained on Datascope monitor and low-flow oxygen throughout.  ASSESSMENT: 1. Abdominal pain, uncertain etiology.  No evidence of upper gastrointestinal    pathology. 2. Normal endoscopy.  RECOMMENDATIONS: 1. Continue Prevacid 30 mg p.o. daily. 2. ROV p.r.n. 3. Avoid NSAIDs and aspirin products. DD:  09/23/99 TD:  09/24/99 Job: [id number]

## 2010-07-11 NOTE — Op Note (Signed)
[place]  Patient:    [name], [name] Visit Number: [id number] MRN: [id number]          Service Type: END Location: ENDO Attending Physician:  Deneen Harts Dictated by:   Griffith Citron, M.D. Proc. Date: 05/02/01 Admit Date:  05/02/2001 Discharge Date: 05/02/2001                             Operative Report  PROCEDURE:  Twenty four hour intraesophageal pH profile.  ENDOSCOPIST:  Griffith Citron, M.D.  INDICATIONS:  A 77 year old white male who had persistent symptoms of reflux despite PPI therapy.  Study completed over 24 hours with the patient having taken his last dose of medication Tuesday, April 26, 2001, almost one full week prior to the study.  DESCRIPTION OF PROCEDURE:  The study was performed at [facility].  After reviewing the nature of the study with the patient including the potential risks and complications, informed consent was obtained.  Using a [place] 24 hour intraesophageal pH probe, the nares was intubated, and the probe positioned approximately 3 cm above the LES.  Date of monitoring was started.  The patient was given instructions and a diary to be maintained.  He returned after 23 hours 42 minutes having experienced no complications during the course of the study.  RESULTS:  The patient experienced minimal reflux episodes with percent time pH less than 4.0, 2.1% total.  There were a total of 19 reflux episodes, 15 in the upright position, 4 in the recumbent position and only two episodes lasting greater than 5 minutes, one each upright and recumbent.  The longest episode of reflux lasted for 11 minutes.  Mean duration of acid exposure was 1.6% of the total study time.  These are the results for the proximal probe.  The distal probe recorded 3% total time.  The pH was less than 4.0, 2.9% in the upright position, 3.1% when recumbent.  A total of 39 reflux episodes occurred, 16 while  upright, 23 while recumbent.  Only two lasted greater than 5 minutes with the longest episode lasting for 12 minutes when upright and 9 minutes when recumbent.  The mean duration of acid exposure was 1.1% of the total study time.  A DeMeester score was 18.8 with normal being less than 22.  Review of the 24 hour profile confirmed minimal reflux occurring throughout the course of the study.  The patients diary did not record any symptoms.  ASSESSMENT:  No evidence of significant GE acid reflux.  RECOMMENDATION: 1. Search for alternative explanations for the patients complaints    of reflux type symptoms. 2. The patient will be brought back to the office to review these    findings. Dictated by:   Griffith Citron, M.D. Attending Physician:  Deneen Harts DD:  05/23/01 TD:  05/23/01 Job: [id number]

## 2010-07-11 NOTE — H&P (Signed)
[place]  Patient:    [name], [name]                       MRN: [id number] Adm. Date:  [id number] Attending:  Swaziland, Peter Manning CC:         [name], M.D.             [facility]                         History and Physical  REASON FOR ADMISSION:  Vague chest and arm discomfort.  SUBJECTIVE:  The patient is 19 and has a history of cervical lumbar disk disease.  He showed up at the [facility] late this afternoon and was transported to [facility] by emergency medical Vehicle.  He has no history of coronary artery disease, although he is followed by Dr. Swaziland.  He apparently had a heart catheterization that showed less than a 30% blockage and a recent Cardiolite study that was without evidence of ischemia.  Both of these test results are per the patients history.  This morning he awakened with a feeling of pressure on his chest that was mild, and also a numbness and aching in his left arm.  This is the same quality of discomfort that has prompted cardiac evaluation of the past.  Nitroglycerin did not improve the discomfort.  The ECG at [facility] did not reveal any acute changes.  MEDICATIONS: 1. Prevacid 30 mg per day. 2. Celebrex or Naprosyn for pain control. 3. Plendil.  The patient does not know the size of his medication.  ALLERGIES:  SULFA.  SIGNIFICANT MEDICAL PROBLEMS: 1. Peptic ulcer disease. 2. Hypertension. 3. History of cervical lumbar disk disease, status post both surgical and    lumbar disk surgery.  REVIEW OF SYSTEMS:  Unremarkable.  FAMILY HISTORY:  Noncontributory.  OBJECTIVE:  GENERAL:  On examination, the patient is in no acute distress.  VITAL SIGNS:  His blood pressure is 166/89, heart rate is 89.  He is afebrile. Respirations are 16 and nonlabored.  SKIN:  Clear to observation.  HEENT:  Reveals pupils equal, round, and reactive to light.  NECK:   Examination reveals no JVD, carotid bruits or thyromegaly.  LUNGS:  Reveal faint basilar rales.  CARDIAC:  Examination is unremarkable.  ABDOMEN:  Soft.  No liver, spleen enlargement is noted.  EXTREMITIES:  Reveal no edema.  PULSES:  Pulses are 2+ and symmetric in upper and lower extremities.  NEUROLOGICAL:  Examination is normal.  Chest x-ray is pending.  ASSESSMENT:  Chest pain that is atypical in this patient who has had previous evaluation for coronary disease but never documented to have significant disease or ischemia.  PLAN:  The patient will be admitted to the hospital to rule out myocardial infarction.  Further investigation will follow.  Rule out with enzymes and ECG per Dr. Swaziland. DD:  06/23/99 TD:  06/24/99 Job: [id number]

## 2010-07-11 NOTE — Cardiovascular Report (Signed)
NAMECHRISTOPHERJOHN, [name] NO.:  [id number]   MEDICAL RECORD NO.:  [id number]          PATIENT TYPE:  OIB   LOCATION:  6501                         FACILITY:  [facility]   PHYSICIAN:  [name], M.D.  DATE OF BIRTH:  [birth year]   DATE OF PROCEDURE:  03/25/2004  DATE OF DISCHARGE:                              CARDIAC CATHETERIZATION   INDICATIONS FOR PROCEDURE:   77 year old, white male history of peripheral  vascular disease, tobacco use, hypertension, hyperlipidemia.  Presented with  recent atypical chest pain.  Subsequent adenosine Cardiolite study was  abnormal showing predominantly fixed inferolateral wall defect. Ejection  fraction was reduced to 41%.   PROCEDURES:  1.  Left heart catheterization.  2.  Coronary and left ventricular angiography.   EQUIPMENT USED:  A 4-French, 4 cm right and left Judkins catheter, 4-French  pigtail catheter, 4-French arterial sheath with access via the right femoral  artery using standard Seldinger technique.   COMPLICATIONS:  None.   CONTRAST:  90 cc of Omnipaque.   MEDICATIONS:  Local anesthesia with 1% Xylocaine.   HEMODYNAMIC DATA:  Aortic pressure is 138/71 with a mean of 98.   Left ventricular pressure was 136 with EDP of 13 mmHg.   ANGIOGRAPHIC DATA:  The left coronary artery rises and distributes normally.  The left main coronary is without significant disease.   The left anterior descending artery is a large vessel which wraps around the  apex.  It is moderately calcified.  There is 30% narrowing in the proximal  LAD.  There is a large diagonal branch which is without significant disease.   There is a moderate intermediate vessel which is without significant  disease.   The left circumflex coronary gives rise to a small marginal vessel and the  mid circumflex and then terminates in another marginal vessel.  In the mid  circumflex prior to the first OM, there is an 80% stenosis.  Following the  takeoff of  this marginal vessel, there is 90% eccentric lesion in the  circumflex distally.   The right coronary artery rises and distributes normally.  It is a dominant  vessel.  It has 30% narrowing proximally.  There are diffuse irregularities  in the mid vessel up to 20%.  Distal vessel has a focal 40% stenosis.  The  PDA is without significant disease.  There is a single posterolateral branch  which also was without significant disease.   Left ventricular angiography performed in RAO view.  This demonstrates  normal left ventricle chamber size.  There is minimal inferior wall  hypokinesia with overall low normal left ventricular systolic function.  Ejection fraction is estimated 50%.  There is no significant mitral  insufficiency.   IMPRESSION:  1.  Single vessel obstructive atherosclerotic coronary disease involving the      mid and distal circumflex.  2.  Low normal left ventricular systolic function.   PLAN:  Recommend initiating Plavix therapy and beta-blocker.  Recommend  consideration for percutaneous intervention of the left circumflex coronary  with stenting.      PMJ/MEDQ  D:  03/25/2004  T:  03/25/2004  Job:  [id number]   cc:   Teena Irani. Arlyce Dice, M.D.  [address]  [place]  [place] [address]  Fax: [phone]   Larina Earthly, M.D.  [address]  [place]  [place] [address]

## 2010-07-11 NOTE — Discharge Summary (Signed)
[name], [name] NO.:  [id number]   MEDICAL RECORD NO.:  [id number]          PATIENT TYPE:  OIB   LOCATION:  6533                         FACILITY:  [facility]   PHYSICIAN:  [name], M.D.  DATE OF BIRTH:  10-12-1933   DATE OF ADMISSION:  03/27/2004  DATE OF DISCHARGE:  03/28/2004                                 DISCHARGE SUMMARY   HISTORY OF PRESENT ILLNESS:  [name] is a 77 year old white male with  recent onset of chest pain and abnormal Cardiolite study.  Cardiac  catheterization demonstrated severe tandem lesions in the mid and distal  left circumflex coronary artery.  The patient was admitted for percutaneous  intervention of these lesions.  For details of his past medical history,  social history, family history, and physical exam, please see admission  history and physical.   HOSPITAL COURSE:  The patient was taken to the cardiac catheterization lab.  He underwent successful stenting of the mid and distal left circumflex  coronary artery.  The distal lesion was segmental and distended with a 2.5 x  16 mm Taxus stent.  The mid-vessel was stented using a 2.5 x 12 mm Taxus  stent.  He had excellent angiographic result with no residual stenosis and  excellent flow.  Following the procedure the patient had no complications.  He had no groin hematoma.  His hemoglobin remained stable.  He had no renal  insufficiency.  His ECG remained stable and cardiac enzymes on followup were  negative.  He was discharged home the following day in stable condition.   DISCHARGE DIAGNOSES:  1.  Coronary artery disease with single tandem lesions in the circumflex      coronary artery successfully stented.  2.  Peripheral vascular disease status post right iliac stent.  3.  Tobacco abuse.  4.  Hyperlipidemia.  5.  Hypertension.   DISCHARGE MEDICATIONS:  1.  Aspirin 325 mg daily.  2.  Plavix 75 mg daily.  3.  Nexium 40 mg per day.  4.  Toprol-XL 25 mg per day.  5.  Plendil 5 mg daily.  6.  Lipitor 10 mg per day.   DISCHARGE ACTIVITY:  He is instructed to avoid lifting or straining for five  days.   DISCHARGE DIET:  He will follow a low-fat, low-salt diet.   FOLLOWUP:  He will follow up with Dr. Swaziland in one week.   DISCHARGE STATUS:  Improved.      PMJ/MEDQ  D:  03/28/2004  T:  03/28/2004  Job:  [id number]   cc:   Teena Irani. Arlyce Dice, M.D.  [address]  [place]  [place] [address]  Fax: [phone]

## 2010-07-11 NOTE — H&P (Signed)
NAMEJANIE, STROTHMAN NO.:  [id number]   MEDICAL RECORD NO.:  [id number]          PATIENT TYPE:  AMB   LOCATION:                               FACILITY:  [facility]   PHYSICIAN:  [name], M.D.  DATE OF BIRTH:  10/07/1933   DATE OF ADMISSION:  03/25/2004  DATE OF DISCHARGE:                                HISTORY & PHYSICAL   HISTORY OF PRESENT ILLNESS:  Mr. Dorvil is a 77 year old white male with a  long history of atypical chest pain.  He had a negative cardiac  catheterization in 1999.  He has had several followup Cardiolite studies  since that time.  He does have a history of peripheral vascular disease.  He  presents with recent symptoms of chest pain.  This is described as a left  precordial heaviness like a cinder block.  With one episode of pain, he  felt flushed and lightheaded and had numbness in his legs.  He underwent an  adenosine Cardiolite study on March 13, 2004.  This showed a fixed  inferoapical defect and a reduced ejection fraction of 41%.  It is unclear  whether this is a true abnormality or is artifactual related to an adjacent  loop of bowel that had increased Cardiolite uptake.  However, because of his  recent symptoms and change in his Cardiolite study, we have recommended he  undergo cardiac catheterization.   PAST MEDICAL HISTORY:  1.  Peripheral vascular disease, status post right iliac stent.  2.  Tobacco abuse.  3.  Hypertension.  4.  Hypercholesterolemia.  5.  History of diverticulitis.  6.  Hiatal hernia.  7.  Osteoarthritis, status post back and neck surgery.  8.  History of hernia repair x 5.  9.  Status post appendectomy.   ALLERGIES:  SULFA.   CURRENT MEDICATIONS:  1.  Aspirin 81 mg per day.  2.  Plendil 5 mg daily.  3.  Nexium 40 mg per day.   SOCIAL HISTORY:  The patient is retired.  He is married.  He smokes less  than one-half pack per day now, but has a 45-pack-year history of smoking.  Denies alcohol use.   He does have two children.  He reports recent increased  stress due to his wife's depression.   FAMILY HISTORY:  Father died at age 16 with myocardial infarction.  One  brother has had a pacemaker placed.   REVIEW OF SYSTEMS:  He still has bilateral claudication, although improved  since he had the right iliac stent placed.  No history of TIA or stroke.  No  abdominal pain.  Other review of systems are negative.   PHYSICAL EXAMINATION:  GENERAL APPEARANCE:  The patient is a tall, thin,  white male in no apparent distress.  VITAL SIGNS:  The blood pressure is 152/86.  The pulse is 62 and irregular.  Respirations are 20.  HEENT:  Normocephalic and atraumatic.  Pupils equal, round and reactive.  Conjunctivae are clear.  Oropharynx is clear.  NECK:  Without JVD, adenopathy, thyromegaly or bruits.  LUNGS:  Clear to  auscultation and percussion.  CARDIAC:  Regular rate and rhythm.  Normal S1 and S2 without murmur, rub,  gallop or click.  ABDOMEN:  Soft and nontender.  There are no masses or bruits  EXTREMITIES:  Femoral and pedal pulses are 2+ and symmetric.  He has no  edema.   LABORATORY DATA:  ECG shows frequent PVCs, otherwise normal.  Chest x-ray  shows no active disease.   IMPRESSION:  1.  Chest pain with abnormal Cardiolite study.  2.  Peripheral vascular disease, status post right iliac stent.  3.  Hypertension.  4.  Hypercholesterolemia.  5.  Tobacco abuse.  6.  Premature ventricular complexes.   PLAN:  Proceed with cardiac catheterization.                                               ______________________________  [name], M.D.    PMJ/MEDQ  D:  03/17/2004  T:  03/17/2004  Job:  [id number]   cc:   Rema Fendt, N.P.  [facility]

## 2010-07-11 NOTE — Op Note (Signed)
NAME:  [name], [name]                          ACCOUNT NO.:  [id number]   MEDICAL RECORD NO.:  [id number]                   PATIENT TYPE:  OIB   LOCATION:  NA                                   FACILITY:  [facility]   PHYSICIAN:  Larina Earthly, M.D.                 DATE OF BIRTH:  1933-09-26   DATE OF PROCEDURE:  07/11/2003  DATE OF DISCHARGE:                                 OPERATIVE REPORT   PREOPERATIVE DIAGNOSIS:  Right leg claudication with probable right iliac  occlusive disease.   POSTOPERATIVE DIAGNOSIS:  Right leg claudication with common iliac artery  stenosis.   OPERATION PERFORMED:  1. Aortogram with bilateral lower extremity runoff.  2. Right common iliac artery angioplasty and Genesis 7 x 24 stent placement.   SURGEON:  Larina Earthly, M.D.   ANESTHESIA:  1% lidocaine local.   COMPLICATIONS:  None.   DISPOSITION:  Attempted percutaneous Perclose closure which was unsuccessful  and the patient had standard groin pressure for hemostasis and standard six  hour bedrest.   DESCRIPTION OF PROCEDURE:  The patient was taken to the peripheral vascular  cath lab and placed in supine position where the area of both groins was  prepped and draped in the usual sterile fashion.  Using local anesthesia and  single wall puncture, the right common femoral artery was entered and the  guidewire was passed up to the level of the suprarenal aorta.  A Wholey wire  was chosen.  A 5 French sheath was passed over the guidewire and a pigtail  catheter positioned at the level of suprarenal aorta.  AP projections  revealed widely patent celiac, SMA, and single renal arteries bilaterally.  The patient had some mild irregularity but no flow limiting stenosis in the  aorta.  Pigtail catheter was then pulled down to the aortic bifurcation.  The patient had prior Ray cage repair of vertebral disease.  The patient had  a severe subtotal occlusion of the common iliac artery just above the  hypogastric  artery take off.  The left iliac system is without stenosis.  The IMA was patent into the pelvis.  The patient was given 4000 units of  intravenous heparin.  The 5 French sheath was exchanged for a long 6 [place]  sheath.  A 7 mm x 24 mm Genesis balloon and stent were chosen, were  positioned through the long sheath and the stent was positioned at the  midportion of the stenosis.  This area was opened with balloon angioplasty.  Repeat arteriogram revealed no residual stenosis.  Runoff revealed irregular  but patent superficial femoral arteries bilaterally with moderate stenosis  of the adductor canal bilaterally.  The popliteal arteries were widely  patent and the anterior tibial arteries were the dominant runoff vessels to  the feet  bilaterally.  The posterior tibial artery was occluded on the  right.  The posterior tibial  and peroneal arteries were patent on the left  and the peroneal artery was patent on the right as well.  The patient  tolerated the procedure well.  Decision was made to close with Perclose due  to the prior difficulties with degenerative disease in his back.  The 6  French Perclose device was chosen.  This was positioned in the appropriate  place in the artery wall and fired.  On attempting to push the monofilament  knot down, the suture broke and therefore standard hemostasis was obtained  with pressure.  There was a small hematoma at the groin puncture site but no  expansile mass.  The patient will be held for six hours and then discharged  to home.   FINDINGS:  1. Severe common iliac artery stenosis.  2. Patent superficial femoral, popliteal runoff as described.  3. Successful balloon angioplasty and stent placement of right common iliac     artery stenosis.                                               Larina Earthly, M.D.    TFE/MEDQ  D:  07/11/2003  T:  07/11/2003  Job:  [id number]

## 2010-07-11 NOTE — Op Note (Signed)
[name], FRONCZAK                ACCOUNT NO.:  [id number]   MEDICAL RECORD NO.:  [id number]          PATIENT TYPE:  AMB   LOCATION:  SDS                          FACILITY:  [facility]   PHYSICIAN:  [name], M.D. DATE OF BIRTH:  1933/07/21   DATE OF PROCEDURE:  DATE OF DISCHARGE:  10/20/2005                                 OPERATIVE REPORT   PREOPERATIVE DIAGNOSIS:  Left upper lobe mass.   PREOPERATIVE DIAGNOSIS:  Left upper lobe mass.   OPERATION PERFORMED:  Fiberoptic bronchoscopy, mediastinum.   ANESTHESIA:  General anesthesia.   PROCEDURE IN DETAIL:  The fiberoptic bronchoscope was passed through the  endotracheal tube.  The carina was in the midline.  The right upper lobe,  right middle lobe and right lower lobe orifices were normal.  The right main  stem orifice was normal.  Left main stem orifice was normal. The left lower  lobe had some bronchitis but it was normal, and the left upper lobe, both  the lingular and the apical posterior branches had no endobronchial lesions.  We did brushings from the left upper lobe in the lingular area and then  transbronchial biopsies for this area.   The video bronchoscope was removed.  Anterior neck was prepped and draped in  the usual sterile manner.  A transverse incision was made and carried down  with electrocautery to the subcutaneous tissue and fascia.  Pretracheal  fascia was entered and biopsies of 4L x 2, 7 and 4 R nodes were done.  The  strap muscle was closed with 2-0 Vicryl, subcutaneous tissue with 3-0 Vicryl  and Dermabond for the skin.  Patient returned to the recovery room in stable  condition.           ______________________________  [name], M.D.     DPB/MEDQ  D:  10/20/2005  T:  10/20/2005  Job:  [id number]   cc:   [name], M.D.  Evelena Peat, M.D.

## 2010-07-11 NOTE — Consult Note (Signed)
[name], [name] NO.:  [id number]   MEDICAL RECORD NO.:  [id number]          PATIENT TYPE:  INP   LOCATION:  2013                         FACILITY:  [facility]   PHYSICIAN:  Lajuana Matte, MD  DATE OF BIRTH:  Nov 29, 1933   DATE OF CONSULTATION:  12/04/2005  DATE OF DISCHARGE:                                   CONSULTATION   REASON FOR CONSULTATION:  A 77 year old white male diagnosed with non-small  cell lung cancer for consideration of adjuvant therapy.   HISTORY:  [name] is a very pleasant 77 years old white male with past  medical history significant for hypertension, hypercholesterolemia, COPD and  coronary heart disease as well as heavy smoking history who was evaluated in  August 2007 by Dr. Arbie Cookey for questionable leg pain and consideration of  stent placement.  The patient had CT scan of the chest ordered by his family  physician, Dr. Caryl Never, on September 30, 2005 for evaluation of increasing  shortness of breath and the scan showed a mass in the left upper lobe  measuring 7.7 x 8.1 cm with a broad base of contact with the pleura  measuring approximately 7 cm.  There was also ipsilateral mediastinal  lymphadenopathy and this was most consistent with primary bronchogenic  carcinoma.  The contralateral mediastinal and right hilar lymph nodes were  considered enlarged by CT size criteria.  The patient had a PET scan  performed on October 07, 2005 and again showed the large left upper lobe mass  with abnormal FDG accumulation.  There were positive mediastinal and left  hilar nodes.  No evidence for metastatic disease involving the abdomen or  pelvis.  MRI of the brain performed on October 19, 2005 showed no evidence to  suggest metastatic disease.  The patient was referred to Dr. Edwyna Shell and on  December 01, 2005 he underwent left thoracotomy with left upper lobectomy and  lymph node dissection.  The pathology revealed a 9 cm tumor consistent with  squamous  cell carcinoma.  The margins were not involved.  The 15 dissected  lymph nodes from levels 10L, 11L, 4L, 5 and 9L were all negative for  metastatic disease.  Dr. Edwyna Shell kindly asked me to evaluate the patient  today for consideration of adjuvant chemotherapy because of the large tumor  size.  When seen today, he is doing well.  He is recovering from his surgery  with no significant complaint except for the left-sided chest discomfort at  the surgical site.  Otherwise, he is getting better.  He is off oxygen at  the moment.   REVIEW OF SYSTEMS:  He has no fever or chills.  No headache.  No blurry  vision, double vision.  Continues to have the left-sided chest discomfort.  No significant shortness of breath.  Has mild cough.  No hemoptysis, syncope  or palpitation.  No nausea or vomiting.  No abdominal pain, diarrhea,  constipation, melena or hematochezia.  No dysuria or hematuria.   PAST MEDICAL HISTORY:  1. Significant for hypertension.  2. Hypercholesterolemia.  3. Coronary artery disease.  4.  Chronic obstructive pulmonary disease.  5. Peripheral vascular disease.   FAMILY HISTORY:  Unremarkable for malignancy but there is family history of  coronary heart disease.   SOCIAL HISTORY:  He is married, has 2 children, a son and daughter.  His  daughter currently is studying nursing.  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  He has a history of smoking half a pack per day for over 50  years.  Denied having any history of alcohol or drug abuse.   ALLERGIES:  HE IS ALLERGIC TO SULFA.   HOME MEDICATIONS:  On admission, included Toprol, Plavix, Lipitor, Nexium,  Celebrex.   PHYSICAL EXAMINATION:  His blood pressure was 100/42, pulse 70, respiratory  rate 18, temperature 97.5, oxygen saturation 97% on room air.  GENERAL:  Exam showed a very pleasant 77 years old white male in no acute  distress.  HEENT:  Normocephalic, atraumatic.  Clear oropharynx.  NECK:  Supple, no  lymphadenopathy.  CHEST:  Decreased breath sounds at the left lung base.  CARDIOVASCULAR:  Normal S1 and S2, no murmur or gallop.  ABDOMEN:  Soft, nontender, nondistended , no masses.  EXTREMITY:  Show no edema.   LABORATORY DATA:  CBC and BMET today showed white blood count 10.5,  hemoglobin 8, hematocrit 24.2, platelets 232.  Sodium 126, potassium 4,  glucose 134, BUN 5, creatinine 0.5, calcium 8.2.   ASSESSMENT AND PLAN:  This is a pleasant 77 year old white male recently  diagnosed with stage IB (T2, N0, M0) non-small cell lung cancer consistent  with squamous cell carcinoma.  I had a long discussion with the patient and  his family today regarding the role of adjuvant chemotherapy in stage IB.  I  explained to the patient that recent clinical trials showed the benefit for  adjuvant chemotherapy with patient with stage IB only as the tumor size is  bigger than 4 cm.  I think the patient may benefit from adjuvant  chemotherapy with platinum based regimen.  This will be given 4-6 weeks  after his chemotherapy for a total of four cycles.  The patient agreed to  this treatment plan and I will see him back for follow-up visit in 2 weeks  at the [facility] for more detailed discussion of his  chemotherapy and this treatment option.  For anemia, most probably iron  deficiency anemia, and the patient will benefit from treatment with oral  iron tablet.   Thank you so much for allowing me to participate in the care of [name].      Lajuana Matte, MD  Electronically Signed     MKM/MEDQ  D:  12/04/2005  T:  12/05/2005  Job:  [id number]   cc:   [name], M.D.  Larina Earthly, M.D.  Evelena Peat, M.D.

## 2010-07-11 NOTE — H&P (Signed)
NAMEBENARD, Pedro NO.:  Willis   MEDICAL RECORD NO.:  [id number]          PATIENT TYPE:  INP   LOCATION:  NA                           FACILITY:  [facility]   PHYSICIAN:  [name], M.D. DATE OF BIRTH:  [birth year]   DATE OF ADMISSION:  DATE OF DISCHARGE:                                HISTORY & PHYSICAL   CHIEF COMPLAINT:  Left lung mass.   HISTORY OF PRESENT ILLNESS:  This 77 year old Caucasian male started  developing some left chest pain and he has a long history of smoking.  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  He also has a history of previous  cardiac disease and has had several stents by Dr. [place]il[place] and was evaluated  for leg pain by Dr. Arbie Cookey and had previous right iliac angioplasty.  His CT  scan was done which showed a 5 x 6 left upper lobe lesion with some hilar  and mediastinal adenopathy.  PET scan was positive in the left upper lobe  lesion as well as a hilar and mediastinal adenopathy.  His pulmonary  function tests showed an FVC of 3.36 with an FEV of 1.76 which is 40% of  predicted.  He underwent mediastinoscopy and the mediastinoscopy was  negative.  He has tried to stop smoking and he had been on Plavix and we  stopped his Plavix for a needle biopsy.  The needle biopsy was done and  showed nonsmall cell cancer.  There was evidence of granulomatous  inflammation in some of the lymph nodes.  He has been admitted for left  upper lobectomy after receiving cardiac clearance from Dr. [place].  He has  had no hemoptysis, fever, chills, or excessive sputum.   PAST MEDICAL HISTORY:  1. Hypertension.  2. Hypercholesterolemia.  3. Chronic obstructive pulmonary disease.  4. Coronary artery disease.   ALLERGIES:  He is allergic to SULFA.   MEDICATIONS:  1. Toprol 25 mg a day.  2. Plavix 75 mg a day.  3. He was on Lipitor but that has been stopped.  4.  Nexium 40 mg a day.  5. Celebrex 200 mg twice a day.  6. __________ 5 mg a day.   FAMILY HISTORY:  Positive for vascular disease but negative for cancer.   SOCIAL HISTORY:  He is married and smokes half a pack of cigarettes a day.  He does not drink alcohol on a regular basis.   REVIEW OF SYSTEMS:  His weight has been stable.  He is 192 pounds.  He is 6  feet 2 inches.  CARDIAC:  He has intermittent chest tightness.  No atrial  fibrillation.  He gets shortness of breath with exertion.  PULMONARY:  See  history of present illness.  GASTROINTESTINAL:  He has hiatal hernia and  reflux.  GENITOURINARY:  No frequent urination, kidney disease, or dysuria.  VASCULAR:  No claudication.  See history of present illness.  NEUROLOGIC:  No headaches, blackouts, or seizures.  MUSCULOSKELETAL:  He has chronic  joint pain and arthritis.  PSYCHIATRIC:  No psychiatric illness.  ENT:  No  changes in eyesight or hearing.  HEMATOLOGIC:  No problems with bleeding or  anemia.   PHYSICAL EXAMINATION:  GENERAL:  He is a well-developed, Caucasian male in  no acute distress.  VITAL SIGNS:  His blood pressure is 142/80, pulse 54, respirations 18,  saturating 92%.  HEENT:  Head is atraumatic.  Eyes:  Pupils equal, reactive to light and  accommodation.  Extraocular movements are normal.  Ears:  Tympanic membranes  are intact.  Nose:  There is no separate deviation.  Throat is without  lesions.  NECK:  Supple without thyromegaly.  There is no supraclavicular or axillary  adenopathy.  No carotid bruits.  CHEST:  There are some bilateral wheezes on inspiration.  HEART:  Regular sinus rhythm.  No murmurs.  ABDOMEN:  Soft.  There is no hepatosplenomegaly.  EXTREMITIES:  Femoral pulses are 2+.  No clubbing or edema.  NEUROLOGIC:  He is oriented x3.  Sensory and motor intact.  Cranial nerves  are intact.   IMPRESSION:  1. Left upper lobe mass, nonsmall cell lung cancer.  2,  Coronary artery disease, status post  stenting.  1. Hypercholesterolemia.  2. Hyperlipidemia.  3. Tobacco abuse.  4. Chronic obstructive pulmonary disease.   PLAN:  Left upper lobectomy, with hilar node dissection.           ______________________________  [name], M.D.     DPB/MEDQ  D:  11/27/2005  T:  11/28/2005  Job:  [id number]

## 2010-07-11 NOTE — Cardiovascular Report (Signed)
[name], [name] NO.:  [id number]   MEDICAL RECORD NO.:  [id number]          PATIENT TYPE:  OBV   LOCATION:  2912                         FACILITY:  [facility]   PHYSICIAN:  Elmore Guise., M.D.DATE OF BIRTH:  Mar 03, 1933   DATE OF PROCEDURE:  03/20/2006  DATE OF DISCHARGE:                            CARDIAC CATHETERIZATION   PROCEDURE:  Cardiac catheterization.   INDICATIONS FOR PROCEDURE:  Inferior ST elevation on ECG consistent with  inferior ST elevation infarction.   DESCRIPTION OF PROCEDURE:  The patient was brought emergently to the  cardiac cath lab with a code STEMI.  He was prepped and draped in a  sterile fashion.  Approximately 10 mL of 1% lidocaine was used for local  anesthesia.  A 6 French sheath was placed in the right femoral artery  without difficulty.  Coronary angiography and LV angiography were then  performed.  The patient tolerated the procedure well and was transferred  from the cardiac cath lab in stable condition.   FINDINGS:  1. Left Main:  Mild luminal irregularities.  2. LAD:  Moderate proximal and mid calcification with mild to moderate      luminal irregularities.  3. D1:  Large vessel with mild luminal irregularities.  4. LCX:  Proximal and mid calcification, nondominant artery with      patent mid stent.  There is proximal and mid luminal      irregularities.  No significant in-stent stenosis noted.  His mid      and distal vessel has mild luminal irregularities.  5. OM1:  Small vessel with mild luminal irregularities.  6. Ramus intermedius:  Moderate sized vessel with mild luminal      irregularities.  7. RCA:  Dominant with moderate proximal luminal irregularities, large      RV marginal branch coming off the proximal portion.  There is a      hazy area in the distal vessel with 50-60% stenosis.  There is TIMI      III flow noted.  His PDA and PLV also showed mild luminal      irregularities.  8. LV:  EF is 55% with  normal wall motion.  LVEDP is 5 mmHg.  9. Limited right femoral angiogram was performed showing moderate      atherosclerosis but no area of dissection.   IMPRESSION:  1. Nonobstructive coronary arteries with hazy distal RCA of      approximately 50% stenosis noted.  2. Preserved LV systolic function with an EF of 55%.  No wall motion      abnormalities.  3. Hypovolemia.  4. Patent mid circumflex stent.   PLAN:  The patient will continue with aggressive medical therapy.  I did  review the images at length with Dr. Deborah Chalk. We will continue aspirin,  Plavix, Integrilin, Toprol, and add a statin.  He will be treated  medically with down titration of his nitroglycerin as tolerated.      Elmore Guise., M.D.  Electronically Signed     TWK/MEDQ  D:  03/20/2006  T:  03/20/2006  Job:  [id number]   cc:   [name], M.D.

## 2010-07-11 NOTE — Cardiovascular Report (Signed)
[name], [name] NO.:  [id number]   MEDICAL RECORD NO.:  [id number]          PATIENT TYPE:  OIB   LOCATION:  2891                         FACILITY:  [facility]   PHYSICIAN:  [name], M.D.  DATE OF BIRTH:  May 06, 1933   DATE OF PROCEDURE:  03/27/2004  DATE OF DISCHARGE:                              CARDIAC CATHETERIZATION   INDICATIONS FOR PROCEDURE:  This is a 77 year old white male with history of  peripheral vascular disease, tobacco abuse, hypertension, hyperlipidemia.  Presented recently with chest pain. Adenosine Cardiolite study was abnormal,  and the patient subsequent underwent cardiac catheterization. This  demonstrated tandem high grade lesions involving the mid and distal  circumflex.   Access was via the right femoral artery using standard Seldinger technique.  Procedure is intracoronary stenting of the middle and distal left  circumflex. Coronary equipment used 6-French, left forearm Voda Guide, a  .014, high-torque floppy extra support wire, 2.25 x 15 mm Maverick balloon,  2.5 x 16 mm Taxus drug-eluting stent, and a 2.5 x 12 mm Taxus drug-eluting  stent.   MEDICATIONS:  Local anesthesia 1% Xylocaine, Versed 2 milligrams IV, heparin  2500 units IV with subsequent ACT of 228, Integrilin double bolus at 180  mcg/kg followed by continuous infusion of 2 mcg/kg per minute, nitroglycerin  200 mcg intracoronary x3, contrast 175 cc of Omnipaque.   HEMODYNAMIC DATA:  Blood pressure aortic pressure was 138/68 with a mean of  96. Pulse is 63. The patient remained stable throughout procedure.   PROCEDURE NOTE:  After initial guide shots were obtained, the patient was  given intracoronary nitroglycerin. This demonstrated tandem lesions in the  mid and distal circumflex with shorter lesion in the mid vessel up to 80%  and a longer lesion in the distal vessel up to 90%. The lesions were crossed  easily with the wire. We initially predilated both lesions  using a 2.25 x 50  mm Maverick balloon, dilating to 6 atmospheres in the distal vessel and 8  atmospheres in the mid vessel. We then proceeded to stent the distal lesion.  Given the diffuse disease in the distal vessel and relatively small vessel,  it was quite difficult to cross this lesion with the stent. However, we did  eventually position the stent adequately, and it was deployed at 9  atmospheres and then post-dilated to 14 atmospheres. This yielded an  excellent angiographic result with 0% residual stenosis and TIMI III flow.  We then proceeded to stent the mid circumflex. The 2.5 x 12 mm Taxus stent  was deployed at 9 atmospheres and post-dilated to 16 atmospheres. This  yielded an excellent angiographic result with 0% residual stenosis. There is  TIMI grade 3 flow throughout. The obtuse marginal vessel which arose between  the two lesions was unaffected by the procedure.   FINAL INTERPRETATION:  Successful intracoronary stenting x2 of tandem  lesions in the mid and distal left circumflex coronary and coronary artery.      PMJ/MEDQ  D:  03/27/2004  T:  03/27/2004  Job:  [id number]   cc:   Onalee Hua  Logan Bores, M.D.  [id number]  [place]  [place] [address]  Fax: [phone]   Larina Earthly, M.D.  [address]  [place]  [place] [address]

## 2010-07-11 NOTE — Discharge Summary (Signed)
NAMEMAJD, TISSUE NO.:  [id number]   MEDICAL RECORD NO.:  [id number]          PATIENT TYPE:  INP   LOCATION:  2013                         FACILITY:  [facility]   PHYSICIAN:  Coral Ceo, P.A.     DATE OF BIRTH:  1933/12/11   DATE OF ADMISSION:  12/01/2005  DATE OF DISCHARGE:  12/08/2005                                 DISCHARGE SUMMARY   PRIMARY ADMITTING DIAGNOSIS:  Left lung mass.   ADDITIONAL/DISCHARGE DIAGNOSES:  1. 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  2. Hypertension.  3. Hypercholesterolemia.  4. History of coronary artery disease.  5. COPD.  6. Peripheral vascular disease.  7. Postoperative atrial fibrillation.  8. Postoperative blood loss anemia.   PROCEDURES PERFORMED:  Left VATS, left thoracotomy, left upper lobectomy and  lymph node dissection.   HISTORY:  The patient is a 77 year old male who was originally seen by Dr.  Arbie Cookey in August 2007 with a history of leg pain and was felt to require an  angioplasty and stent placement.  Prior to the procedure he had a chest x-  ray and a subsequent CT scan which showed a mass in the left upper lobe  measuring 7.7 x 8.1 cm with a broad-based contact with the pleura measuring  approximately 7 cm.  There was also ipsilateral mediastinal lymphadenopathy.  He underwent a PET scan and this once again showed increased uptake in the  left upper lobe mass with positive mediastinal and left hilar nodes.  He was  subsequently referred to Dr. Dewayne Shorter for consideration of surgical  resection.  Dr. Edwyna Shell felt that his best course of action would be to  proceed with a left upper lobectomy at this time.  He did undergo a  mediastinoscopy which was negative prior to surgery.  He then underwent a  needle biopsy which was positive for non-small-cell carcinoma.  Because of  his previous history of coronary artery disease, he had been on Plavix and  this was held prior to surgery.  He was ultimately  cleared to proceed with  surgery and was scheduled for outpatient admission on 12/01/2005.   HOSPITAL COURSE:  He was admitted to [facility] on 12/01/2005 was  taken to the operating room where he underwent a left VATS, a left  thoracotomy and left upper lobectomy with lymph node dissection by Dr.  Edwyna Shell.  He tolerated procedure well and was transferred to the floor from  the recovery room in stable condition.  Postoperatively he has done fairly  well.  From a pulmonary standpoint, he is making good progress.  He has been  treated with mucolytics and aggressive pulmonary toilet measures including  incentive spirometry and nebulizer treatments.  He has been weaned from  supplemental oxygen and is maintaining O2 sats of greater than 90% on room  air.  His chest tubes have all been removed and his most recent chest x-ray  showed no evidence of pneumothorax.  From a cardiac standpoint.  He has been  followed up postoperatively by Dr. Swaziland and his associates.  He  had one  brief episode of atrial fibrillation which resolved spontaneously.  He has  maintained normal sinus rhythm since that time.  He had been restarted on a  beta blocker and Plavix and is doing well.  His pathology was positive for  squamous cell carcinoma which is felt to be stage 1 foot (T2, N0, M0).  He  was seen in consultation by Dr. Arbutus Ped and it is felt that he might benefit  from adjuvant chemotherapy with platinum based regimen.  Dr. Arbutus Ped will  arrange for [facility] for more  detailed discussion of chemotherapy.  The patient has otherwise remained  afebrile and vital signs have been stable and he is progressing well.  He  has had a mild blood loss anemia and has been started on oral iron  supplementation.  He did receive a transfusion of 2 units of packed red  blood cells on 12/06/2005.  Presently his hemoglobin and hematocrit are  stable at 9.2 and 27.8  respectively.  The remainder of his labs have  remained stable with a white count of 7.6, platelets 294, sodium 131,  potassium 3.8, BUN 7, creatinine 0.5.  He is ambulating in the halls without  difficulty.  He is having normal bowel and bladder function, is tolerating a  regular diet without problem.  He was felt that if he continues to remain  stable over the next 24 hours he will hopefully be ready for discharge home  on 12/08/2005.  Discharge medications are as follows.   1. Tylox one to two q.4 h p.r.n. for pain.  2. Magic mouth wash 5 mL swish and swallow t.i.d. for three more days.  3. Diflucan 100 mg daily x3 more days.  4. Niferex 150 mg q.d.  5. Toprol XL 25 mg q.d.  6. Combivent inhaler 2 puffs p.r.n.  7. Plavix 75 mg daily.  8. Nexium 40 mg daily.  9. Plendil 5 mg daily.  10.Aspirin 81 mg daily.   DISCHARGE INSTRUCTIONS:  He is asked to refrain from driving, heavy lifting  or strenuous activity.  He may continue ambulating daily and using his  incentive spirometer.  He may shower daily and clean his incisions with soap  and water.  He will continue same preoperative diet.   DISCHARGE FOLLOWUP:  He will see Dr. Edwyna Shell back in the office in 1 week and  our office will contact him with an appointment time as well as an  appointment for a chest x-ray at [facility].  He will follow up  in approximately 2 weeks with Dr. [facility]butus [facility] for  further discussion of chemotherapy.  He will follow up as directed with Dr.  Swaziland.  He will call our office in the interim if he experiences any  problems or has questions.      Coral Ceo, P.A.     GC/MEDQ  D:  12/07/2005  T:  12/08/2005  Job:  [id number]   cc:   [name], M.D.  Lajuana Matte, MD  [facility]

## 2010-07-11 NOTE — H&P (Signed)
[name], [name] NO.:  [id number]   MEDICAL RECORD NO.:  [id number]          PATIENT TYPE:  OBV   LOCATION:  2912                         FACILITY:  [facility]   PHYSICIAN:  Elmore Guise., M.D.DATE OF BIRTH:  [birth year]   DATE OF ADMISSION:  03/20/2006  DATE OF DISCHARGE:                              HISTORY & PHYSICAL   PRIMARY CARDIOLOGIST:  Dr. Peter Swaziland.   REASON FOR ADMISSION:  ST elevation myocardial infarction.   HISTORY OF PRESENT ILLNESS:  [name] is a very pleasant 77 year old  white male with past medical history of lung cancer (status post left  upper lobectomy October9,2007), coronary artery disease status post PCI  February2,2006, hypertension, dyslipidemia, COPD, peripheral vascular  disease, history of postoperative atrial fibrillation who presents with  12 hours of substernal chest pressure.  The patient states he awoke at 2  a.m. this morning with substernal chest pressure.  He initially thought  this may be my heart acting up.  He took a Toprol as well as two  sublingual nitroglycerin with no significant improvement.  His chest  pain and pressure continued throughout the morning up to 8-10/10 in  quality.  It was associated with diaphoresis and shortness of breath  with significant worsening with exertion.  He took three more sublingual  nitroglycerin approximately 10 a.m. with some improvement in his  symptoms.  He did call the answering service for instructions.  At that  time, he was instructed to go to the emergency room for further  evaluation.  He did present to [facility] ER at 4 p.m. and was found to  have inferior ST elevation and was then transferred to [facility] for further  evaluation.  He did have one episode of dizziness back in December and  was thought to be dehydrated and was treated for vertigo during that ER  evaluation.  He has had 2 chemo treatments associated with significant  nausea and vomiting and swelling in  his left lower leg greater than his  right.  He does report occasional noncompliance with his medicines.   MEDICATIONS:  1. Per report are Tylox p.r.n.  2. Niferex 150 mg daily.  3. Toprol XL 25 mg daily.  4. Ventolin inhaler 2 puffs p.r.n.  5. Plavix 75 mg daily.  6. Nexium 40 mg daily.  7. Plendil 5 mg daily.  8. Aspirin 81 mg daily.   ALLERGIES:  To SULFA.   FAMILY HISTORY:  Positive for heart disease and peripheral vascular  disease.   SOCIAL HISTORY:  Is married.  Smoked a half pack per day.  Does not  drink alcohol.   EXAM:  He is afebrile.  Blood pressure is 140/60, heart rates in the 70s  in normal sinus rhythm.  He is sating 95% on room air.  In general, he  is a very pleasant elderly white male alert and oriented x4 in mild  distress.  His pain level now is approximately 2-3/10 after 1 inch of  Nitropaste added to chest wall.  He has no JVD.  Lungs were clear.  Heart is  regular with S3 noted.  Abdomen is soft, nontender,  nondistended.  He has 2+ femoral pulse and trace pretibial edema.  His  ECG showed 1 mm inferior ST elevation.  His blood work is pending at  time of dictation.   IMPRESSION:  1. Inferior ST elevation.  2. History of coronary artery disease status post percutaneous      coronary intervention to circumflex/obtuse marginal vessel      February2006.  3. History of noncompliance.  4. History of dyslipidemia.   PLAN:  At current time, I discussed with him and his wife risks and  benefits of urgent catheterization.  He will be transferred to [facility]  Cardiac Cath Lab for further evaluation.  He will continue heparin,  Integrilin, aspirin and Plavix.  We will add a statin and continue  nitroglycerin as tolerated.  Dr. Deborah Chalk was notified of the patient's  arrival for possible intervention if needed.  Further recommendations  pending his catheterization.      Elmore Guise., M.D.  Electronically Signed     TWK/MEDQ  D:  03/20/2006  T:   03/20/2006  Job:  [id number]

## 2010-09-11 ENCOUNTER — Other Ambulatory Visit: Payer: Self-pay | Admitting: *Deleted

## 2010-09-11 MED ORDER — ISOSORBIDE MONONITRATE ER 60 MG PO TB24: 60.0000 mg | ORAL_TABLET | Freq: Two times a day (BID) | ORAL | Status: DC

## 2010-09-11 NOTE — Telephone Encounter (Signed)
escribe medication per fax request  

## 2010-10-24 ENCOUNTER — Encounter: Payer: Self-pay | Admitting: Cardiology

## 2010-10-24 ENCOUNTER — Ambulatory Visit (INDEPENDENT_AMBULATORY_CARE_PROVIDER_SITE_OTHER): Payer: Medicare Other | Admitting: Cardiology

## 2010-10-24 DIAGNOSIS — I480 Paroxysmal atrial fibrillation: Secondary | ICD-10-CM

## 2010-10-24 DIAGNOSIS — I10 Essential (primary) hypertension: Secondary | ICD-10-CM | POA: Insufficient documentation

## 2010-10-24 DIAGNOSIS — I2510 Atherosclerotic heart disease of native coronary artery without angina pectoris: Secondary | ICD-10-CM

## 2010-10-24 DIAGNOSIS — I519 Heart disease, unspecified: Secondary | ICD-10-CM

## 2010-10-24 DIAGNOSIS — I4891 Unspecified atrial fibrillation: Secondary | ICD-10-CM

## 2010-10-24 DIAGNOSIS — I739 Peripheral vascular disease, unspecified: Secondary | ICD-10-CM | POA: Insufficient documentation

## 2010-10-24 DIAGNOSIS — I493 Ventricular premature depolarization: Secondary | ICD-10-CM

## 2010-10-24 DIAGNOSIS — C3490 Malignant neoplasm of unspecified part of unspecified bronchus or lung: Secondary | ICD-10-CM | POA: Insufficient documentation

## 2010-10-24 DIAGNOSIS — J019 Acute sinusitis, unspecified: Secondary | ICD-10-CM

## 2010-10-24 DIAGNOSIS — I4949 Other premature depolarization: Secondary | ICD-10-CM

## 2010-10-24 MED ORDER — ALPRAZOLAM 0.5 MG PO TABS: 0.5000 mg | ORAL_TABLET | Freq: Every evening | ORAL | Status: DC | PRN

## 2010-10-24 MED ORDER — AZITHROMYCIN 250 MG PO TABS: ORAL_TABLET | ORAL | Status: AC

## 2010-10-24 NOTE — Assessment & Plan Note (Signed)
Blood pressure is well controlled today. We'll continue his current antihypertensive therapy.

## 2010-10-24 NOTE — Assessment & Plan Note (Signed)
We will send him a prescription for Z-Pak.

## 2010-10-24 NOTE — Progress Notes (Signed)
   [name] Date of Birth: 07-Jul-1933   History of Present Illness: [name] is seen today for followup. He has been feeling poorly this past week. He's had a severe head cold with myalgias and yellowish sinus drainage. His appetite has been poor and he has lost an additional 8 pounds. He was hospitalized in May with her found iron deficiency anemia. He did have GI evaluation which demonstrated no obvious source. He was transfused 2 units of blood. He denies any shortness of breath or chest pain.  Current Outpatient Prescriptions on File Prior to Visit  Medication Sig Dispense Refill  . ALPRAZolam (XANAX) 0.5 MG tablet Take 1 tablet (0.5 mg total) by mouth at bedtime as needed.  30 tablet  3  . amLODipine (NORVASC) 5 MG tablet Take 1 tablet by mouth Daily.      Marland Kitchen aspirin 81 MG tablet Take 81 mg by mouth daily.        Marland Kitchen esomeprazole (NEXIUM) 40 MG capsule Take 40 mg by mouth daily before breakfast.        . fexofenadine (ALLEGRA) 180 MG tablet Take 180 mg by mouth daily.        . isosorbide mononitrate (IMDUR) 60 MG 24 hr tablet Take 1 tablet (60 mg total) by mouth 2 (two) times daily.  60 tablet  5  . PLAVIX 75 MG tablet Take 1 tablet by mouth Daily.      . Polyethylene Glycol 3350 (MIRALAX PO) Take by mouth daily.        . ramipril (ALTACE) 5 MG tablet Take 5 mg by mouth daily.          Allergies  Allergen Reactions  . Sulfa Drugs Cross Reactors     Past Medical History  Diagnosis Date  . CAD (coronary artery disease)   . PVC (premature ventricular contraction)   . HTN (hypertension)   . Lung cancer     non small cell  . Paroxysmal a-fib   . PAD (peripheral artery disease)   . COPD (chronic obstructive pulmonary disease)     Past Surgical History  Procedure Date  . Lobectomy     left upper  . Hernia repair     x5  . Appendectomy   . Back surgery   . Cervical spine surgery     History  Smoking status  . Current Some Day Smoker -- 0.5 packs/day  Smokeless tobacco    . Not on file    History  Alcohol Use No    History reviewed. No pertinent family history.  Review of Systems: The review of systems is positive for continued significant stressors at home dealing with his wife.  All other systems were reviewed and are negative.  Physical Exam: BP 164/82  Pulse 78  Ht 6\' 3"  (1.905 m)  Wt 174 lb 3.2 oz (79.017 kg)  BMI 21.77 kg/m2 He is a tall thin white male in no acute distress. He is normocephalic, atraumatic. Pupils are equal round and reactive to light accommodation. Extraocular movements are full. Oropharynx is clear. Neck is supple without JVD, adenopathy, thyromegaly, or bruits. Lungs are clear. Cardiac exam reveals a regular rate and rhythm without gallop, murmur, or click. His abdomen is soft and nontender. He has no masses or bruits. Femoral and pedal pulses are palpable. Skin is warm and dry. He has no edema. His neurologic exam is nonfocal. LABORATORY DATA:   Assessment / Plan:

## 2010-10-24 NOTE — Assessment & Plan Note (Signed)
He remains asymptomatic. We will continue with his antianginal therapy and dual antiplatelet therapy.

## 2010-10-24 NOTE — Patient Instructions (Signed)
We will prescribe antibiotics (Zpak) for your sinus infection.  Continue your other medications.  I will see you again in 6 months with fasting lab work.

## 2010-10-28 ENCOUNTER — Other Ambulatory Visit: Payer: Self-pay | Admitting: *Deleted

## 2010-10-28 MED ORDER — CLOPIDOGREL BISULFATE 75 MG PO TABS: 75.0000 mg | ORAL_TABLET | Freq: Every day | ORAL | Status: DC

## 2010-10-28 NOTE — Telephone Encounter (Signed)
escribe medication per fax request  

## 2010-11-11 ENCOUNTER — Other Ambulatory Visit: Payer: Self-pay | Admitting: Thoracic Surgery

## 2010-11-11 ENCOUNTER — Other Ambulatory Visit: Payer: Self-pay | Admitting: Cardiology

## 2010-11-11 DIAGNOSIS — D381 Neoplasm of uncertain behavior of trachea, bronchus and lung: Secondary | ICD-10-CM

## 2010-11-11 NOTE — Telephone Encounter (Signed)
escribe medication per fax request  

## 2010-12-03 LAB — BASIC METABOLIC PANEL
BUN: 8
BUN: 9
Calcium: 8.8
Calcium: 9
Creatinine, Ser: 0.59
Creatinine, Ser: 0.6
GFR calc Af Amer: >60
GFR calc non Af Amer: >60
GFR calc non Af Amer: >60
GFR calc non Af Amer: >60
Potassium: 4
Sodium: 129 — ABNORMAL LOW

## 2010-12-03 LAB — CARDIAC PANEL(CRET KIN+CKTOT+MB+TROPI)
CK, MB: 2.5
CK, MB: 2.7
CK, MB: 3
CK, MB: 3.1
CK, MB: 3.2
CK, MB: 3.5
Relative Index: INVALID
Relative Index: INVALID
Total CK: 61
Total CK: 78
Troponin I: 0.03
Troponin I: 0.03
Troponin I: 0.07 — ABNORMAL HIGH

## 2010-12-03 LAB — CBC
HCT: 35.7 — ABNORMAL LOW
HCT: 40
Hemoglobin: 13.1
MCHC: 32.8
MCHC: 33.3
MCV: 85.5
Platelets: 210
RBC: 4.1 — ABNORMAL LOW
RBC: 4.62
RDW: 15.1 — ABNORMAL HIGH
WBC: 7.5

## 2010-12-03 LAB — POCT I-STAT CREATININE
Operator id: 196461
Operator id: 285841

## 2010-12-03 LAB — I-STAT 8, (EC8 V) (CONVERTED LAB)
BUN: 10
Bicarbonate: 26.9 — ABNORMAL HIGH
Chloride: 99
Glucose, Bld: 118 — ABNORMAL HIGH
Glucose, Bld: 134 — ABNORMAL HIGH
Hemoglobin: 14.3
Potassium: 4.5
TCO2: 29
TCO2: 30
pCO2, Ven: 53.9 — ABNORMAL HIGH
pCO2, Ven: 55.9 — ABNORMAL HIGH
pH, Ven: 7.29
pH, Ven: 7.324 — ABNORMAL HIGH

## 2010-12-03 LAB — DIFFERENTIAL
Basophils Relative: 0
Eosinophils Relative: 2
Monocytes Absolute: 0.5
Monocytes Relative: 6
Neutro Abs: 6.2

## 2010-12-03 LAB — PROTIME-INR
INR: 1.1
Prothrombin Time: 14.8

## 2010-12-03 LAB — TSH: TSH: 0.777

## 2010-12-03 LAB — POCT CARDIAC MARKERS
Myoglobin, poc: 91.8
Myoglobin, poc: 98.6
Operator id: 196461
Operator id: 285841

## 2010-12-03 LAB — APTT: aPTT: 51 — ABNORMAL HIGH

## 2010-12-03 LAB — B-NATRIURETIC PEPTIDE (CONVERTED LAB): Pro B Natriuretic peptide (BNP): 322 — ABNORMAL HIGH

## 2010-12-12 ENCOUNTER — Encounter: Payer: Self-pay | Admitting: Thoracic Surgery

## 2010-12-16 ENCOUNTER — Ambulatory Visit
Admission: RE | Admit: 2010-12-16 | Discharge: 2010-12-16 | Disposition: A | Discharge status: Home / Self Care | Payer: Medicare Other | Source: Ambulatory Visit | Attending: Thoracic Surgery | Admitting: Thoracic Surgery

## 2010-12-16 ENCOUNTER — Ambulatory Visit (INDEPENDENT_AMBULATORY_CARE_PROVIDER_SITE_OTHER): Payer: Medicare Other | Admitting: Thoracic Surgery

## 2010-12-16 ENCOUNTER — Encounter: Payer: Self-pay | Admitting: Thoracic Surgery

## 2010-12-16 VITALS — BP 169/88 | HR 60 | Resp 20 | Ht 75.0 in | Wt 174.0 lb

## 2010-12-16 DIAGNOSIS — D381 Neoplasm of uncertain behavior of trachea, bronchus and lung: Secondary | ICD-10-CM

## 2010-12-16 DIAGNOSIS — C3490 Malignant neoplasm of unspecified part of unspecified bronchus or lung: Secondary | ICD-10-CM

## 2010-12-16 NOTE — Progress Notes (Signed)
HPI the patient returns for followup. CT scan shows no evidence of recurrence of his cancer now 5 years since his left lower lobectomy. I will refer him back to his medical Dr. for long-term followup. His lungs are clear to auscultation percussion. He is no supraclavicular axilla adenopathy.   Current Outpatient Prescriptions  Medication Sig Dispense Refill  . ALPRAZolam (XANAX) 0.5 MG tablet Take 1 tablet (0.5 mg total) by mouth at bedtime as needed.  30 tablet  3  . aspirin 81 MG tablet Take 81 mg by mouth daily.        . clopidogrel (PLAVIX) 75 MG tablet Take 1 tablet (75 mg total) by mouth daily.  30 tablet  5  . esomeprazole (NEXIUM) 40 MG capsule Take 40 mg by mouth daily before breakfast.        . ezetimibe (ZETIA) 10 MG tablet Take 10 mg by mouth daily.        . felodipine (PLENDIL) 5 MG 24 hr tablet Take 5 mg by mouth daily.        . fexofenadine (ALLEGRA) 180 MG tablet Take 180 mg by mouth daily.        . isosorbide mononitrate (IMDUR) 60 MG 24 hr tablet Take 1 tablet (60 mg total) by mouth 2 (two) times daily.  60 tablet  5  . NITROGLYCERIN PO Take by mouth as needed.        . NORVASC 5 MG tablet TAKE 1 TABLET DAILY  30 each  5  . Polyethylene Glycol 3350 (MIRALAX PO) Take by mouth daily.        . ramipril (ALTACE) 5 MG tablet Take 5 mg by mouth daily.           Review of Systems: No change   Physical Exam  Cardiovascular: Normal rate, regular rhythm and normal heart sounds.   Pulmonary/Chest: Effort normal and breath sounds normal. No respiratory distress.     Diagnostic Tests: CT scan shows no evidence of recurrence.   Impression: Status post left lower lobectomy for stage IIIa non-small cell lung cancer   Plan: Followup by PCP

## 2011-04-28 ENCOUNTER — Other Ambulatory Visit: Payer: Self-pay | Admitting: Cardiology

## 2011-04-28 ENCOUNTER — Other Ambulatory Visit: Payer: Self-pay

## 2011-04-28 MED ORDER — CLOPIDOGREL BISULFATE 75 MG PO TABS: 75.0000 mg | ORAL_TABLET | Freq: Every day | ORAL | Status: DC

## 2011-05-12 ENCOUNTER — Other Ambulatory Visit: Payer: Self-pay | Admitting: Cardiology

## 2011-06-16 ENCOUNTER — Ambulatory Visit (INDEPENDENT_AMBULATORY_CARE_PROVIDER_SITE_OTHER): Payer: Medicare Other | Admitting: Cardiology

## 2011-06-16 ENCOUNTER — Encounter: Payer: Self-pay | Admitting: Cardiology

## 2011-06-16 VITALS — BP 154/70 | HR 54 | Ht 75.0 in | Wt 175.0 lb

## 2011-06-16 DIAGNOSIS — E7800 Pure hypercholesterolemia, unspecified: Secondary | ICD-10-CM

## 2011-06-16 DIAGNOSIS — I519 Heart disease, unspecified: Secondary | ICD-10-CM

## 2011-06-16 DIAGNOSIS — I2510 Atherosclerotic heart disease of native coronary artery without angina pectoris: Secondary | ICD-10-CM

## 2011-06-16 DIAGNOSIS — C3490 Malignant neoplasm of unspecified part of unspecified bronchus or lung: Secondary | ICD-10-CM

## 2011-06-16 LAB — CBC WITH DIFFERENTIAL/PLATELET
Basophils Relative: 0.7 % (ref 0.0–3.0)
Eosinophils Relative: 6.1 % — ABNORMAL HIGH (ref 0.0–5.0)
HCT: 41.7 % (ref 39.0–52.0)
Hemoglobin: 13.8 g/dL (ref 13.0–17.0)
Lymphs Abs: 1.7 10*3/uL (ref 0.7–4.0)
MCV: 88.1 fl (ref 78.0–100.0)
Monocytes Absolute: 0.6 10*3/uL (ref 0.1–1.0)
Neutro Abs: 3.4 10*3/uL (ref 1.4–7.7)
Platelets: 205 10*3/uL (ref 150.0–400.0)
WBC: 6.1 10*3/uL (ref 4.5–10.5)

## 2011-06-16 LAB — LIPID PANEL
Cholesterol: 166 mg/dL (ref 0–200)
LDL Cholesterol: 91 mg/dL (ref 0–99)
Total CHOL/HDL Ratio: 3

## 2011-06-16 LAB — BASIC METABOLIC PANEL
BUN: 9 mg/dL (ref 6–23)
Chloride: 101 mEq/L (ref 96–112)
Glucose, Bld: 97 mg/dL (ref 70–99)
Potassium: 4 mEq/L (ref 3.5–5.1)

## 2011-06-16 LAB — HEPATIC FUNCTION PANEL
ALT: 13 U/L (ref 0–53)
AST: 15 U/L (ref 0–37)
Albumin: 4.1 g/dL (ref 3.5–5.2)
Total Bilirubin: 0.3 mg/dL (ref 0.3–1.2)

## 2011-06-16 MED ORDER — EZETIMIBE 10 MG PO TABS: 10.0000 mg | ORAL_TABLET | Freq: Every day | ORAL | Status: DC
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
## 2011-06-16 NOTE — Assessment & Plan Note (Addendum)
No definite anginal symptoms by history today. Continue with his medical management. Coronary history as noted in Overview.

## 2011-06-16 NOTE — Assessment & Plan Note (Signed)
No significant evidence of volume overload today.

## 2011-06-16 NOTE — Patient Instructions (Signed)
We will check blood work today and call you with the results.  Continue your current medication  I will see you again in 6 months.

## 2011-06-22 ENCOUNTER — Encounter (INDEPENDENT_AMBULATORY_CARE_PROVIDER_SITE_OTHER): Payer: Medicare Other | Admitting: *Deleted

## 2011-06-22 ENCOUNTER — Ambulatory Visit (INDEPENDENT_AMBULATORY_CARE_PROVIDER_SITE_OTHER): Payer: Medicare Other | Admitting: Surgery

## 2011-06-22 ENCOUNTER — Other Ambulatory Visit: Payer: Self-pay

## 2011-06-22 ENCOUNTER — Encounter: Payer: Self-pay | Admitting: Surgery

## 2011-06-22 DIAGNOSIS — M7989 Other specified soft tissue disorders: Secondary | ICD-10-CM

## 2011-06-22 DIAGNOSIS — M79622 Pain in left upper arm: Secondary | ICD-10-CM

## 2011-06-22 DIAGNOSIS — M79609 Pain in unspecified limb: Secondary | ICD-10-CM

## 2011-06-22 DIAGNOSIS — R0989 Other specified symptoms and signs involving the circulatory and respiratory systems: Secondary | ICD-10-CM

## 2011-06-22 NOTE — Progress Notes (Signed)
[facility]   Patient name: [name] MRN: [id number] DOB: 08-Aug-1933 Sex: male     Reason for referral: No chief complaint on file.   HISTORY OF PRESENT ILLNESS: The patient is referred today for evaluation asymmetric temperature in his left arm and swelling in his left arm. The patient reports having changes proximally 4-5 days ago. He does report a history of having a tractor would hit his arm about 3 weeks ago. He knows that he does bump his elbows frequently when trying to transport his wife who does suffer from Alzheimer's. He was seen earlier by his primary care physician with concerns of her vascular issues and therefore he was brought over for vascular lab studies and evaluation.  The patient has been followed in our office by Dr. early for claudication. The patient states that he still has problems with walking however he is not yet at the point where he wishes to have these addressed. He continues to be medically managed for his hypertension and COPD. He also suffers from coronary artery disease  Past Medical History  Diagnosis Date  . CAD (coronary artery disease)   . PVC (premature ventricular contraction)   . HTN (hypertension)   . Lung cancer     non small cell  . Paroxysmal a-fib   . PAD (peripheral artery disease)   . COPD (chronic obstructive pulmonary disease)   . LV dysfunction   . Anemia, iron deficiency     Past Surgical History  Procedure Date  . Lobectomy     left upper  . Hernia repair     x5  . Appendectomy   . Back surgery   . Cervical spine surgery   . Twenty four hour intraesophageal ph profile. 05/02/2001  . Aortogram with bilateral lower extremity runoff. 07/11/2003  . Fiberoptic bronchoscopy, mediastinum. 10/20/2005  . Left video-assisted thoracoscopic surgery, left 12/01/2005  . Esophagogastroduodenoscopy with biopsy. 04/14/2010    History   Social History  . Marital Status: Married    Spouse Name:  N/A    Number of Children: 2  . Years of Education: N/A   Occupational History  . fabricator    Social History Main Topics  . Smoking status: Current Some Day Smoker -- 0.5 packs/day  . Smokeless tobacco: Not on file  . Alcohol Use: No  . Drug Use: No  . Sexually Active:    Other Topics Concern  . Not on file   Social History Narrative  . No narrative on file    No family history on file.  Allergies as of 06/22/2011 - Review Complete 06/22/2011  Allergen Reaction Noted  . Sulfa drugs cross reactors  10/24/2010    Current Outpatient Prescriptions on File Prior to Visit  Medication Sig Dispense Refill  . ALPRAZolam (XANAX) 0.5 MG tablet Take 1 tablet (0.5 mg total) by mouth at bedtime as needed.  30 tablet  3  . aspirin 81 MG tablet Take 81 mg by mouth daily.        . clopidogrel (PLAVIX) 75 MG tablet Take 1 tablet (75 mg total) by mouth daily.  30 tablet  2  . esomeprazole (NEXIUM) 40 MG capsule Take 40 mg by mouth daily before breakfast.        . ezetimibe (ZETIA) 10 MG tablet Take 1 tablet (10 mg total) by mouth daily.  30 tablet  11  . felodipine (PLENDIL) 5 MG 24 hr tablet Take 5 mg by mouth daily.        Marland Kitchen  fexofenadine (ALLEGRA) 180 MG tablet Take 180 mg by mouth daily.        . isosorbide mononitrate (IMDUR) 60 MG 24 hr tablet Take 1 tablet (60 mg total) by mouth 2 (two) times daily.  60 tablet  5  . NITROGLYCERIN PO Take by mouth as needed.        . NORVASC 5 MG tablet TAKE 1 TABLET DAILY  30 each  6  . Polyethylene Glycol 3350 (MIRALAX PO) Take by mouth daily.        . ramipril (ALTACE) 5 MG tablet Take 5 mg by mouth daily.           REVIEW OF SYSTEMS: The patient denies chest pain or shortness of breath. He does complain of left arm swelling and pain specifically around his elbow. He has complaints of bilateral claudication. All other systems are negative.  PHYSICAL EXAMINATION: General: The patient appears their stated age.  Vital signs are There were no  vitals taken for this visit. HEENT:  No gross abnormalities Pulmonary: Respirations are non-labored Musculoskeletal: There are no major deformities.   Neurologic: No focal weakness or paresthesias are detected, Skin: Ecchymosis on the left elbow. Psychiatric: The patient has normal affect. Cardiovascular: Palpable radial and brachial pulse on the left. No pulsatile mass  Diagnostic Studies: Arterial and venous studies were reviewed today. There is no evidence of arterial insufficiency. There is no evidence of DVT. There is a fluid filled area around the elbow,? Baker's cyst.  Assessment:  Left arm pain  Plan:  I do not see a vascular explanation for the problems he is having in his arm. It is reassuring that he has a normal arterial and venous duplex. I suspect that this more likely is a musculoskeletal problem which should resolve over the next 2-3 weeks. I told the patient to contact our office if he is having persistent issues.     Jorge Ny, M.D. [facility] [place] Office: [phone] Pager:  [phone]

## 2011-06-29 NOTE — Procedures (Unsigned)
DUPLEX DEEP VENOUS EXAM - UPPER EXTREMITY  INDICATION:  Pain and swelling of left upper extremity  HISTORY:  Edema:  Yes Trauma/Surgery:  No Pain:  Yes PE:  No Previous DVT:  No Anticoagulants:  No Other:  DUPLEX EXAM:                                            Bas/               IJV   SCV     AXV    BrachV  Ceph V               R  L  R   L   R  L   R   L   R  L Thrombosis       o      o      o       o      o Spontaneous      +      +      +       + Phasic           +      +      +       + Augmentation     +      +      +       +      + Compressible     +      +      +       +      P Competent Legend:  + - yes  o - no  p - partial  D - decreased  IMPRESSION: 1. No evidence of deep venous thrombosis in the left upper extremity. 2. Superficial veins of the left upper extremity are patent with vein     wall thickening which may indicate previous recanalized superficial     thrombus.  ___________________________________________ V. Charlena Cross, MD  LT/MEDQ  D:  06/22/2011  T:  06/22/2011  Job:  [id number]

## 2011-06-29 NOTE — Procedures (Unsigned)
VASCULAR LAB EXAM  INDICATION:  Pain and discoloration of the left upper extremity  HISTORY: Diabetes:  No Cardiac:  Yes Hypertension:  Yes  EXAM:  Left upper extremity arteries were evaluated using duplex and color-flow Doppler.  There is no evidence of arterial blockage in the left upper extremity. All waveforms are triphasic.  Incidentally there is a complex cystic structure in the proximal forearm in antecubital fossa measuring approximately 1.4 x 1.7 cm in transverse view and slightly irregular shaped in sagittal view.  The structure does not appear to be vascularized.  IMPRESSION: 1. Widely patent left upper extremity arterial system. 2. Irregular cystic structure is observed in the proximal forearm in     antecubital fossa which is the area of the patient's pain.  If     clinically warranted, alternate imaging is suggested as this     appears to be a soft tissue structure.  ___________________________________________ V. Charlena Cross, MD  LT/MEDQ  D:  06/22/2011  T:  06/22/2011  Job:  [id number]

## 2011-08-04 ENCOUNTER — Other Ambulatory Visit: Payer: Self-pay | Admitting: Cardiology

## 2011-08-13 ENCOUNTER — Other Ambulatory Visit: Payer: Self-pay | Admitting: Cardiology

## 2011-08-13 ENCOUNTER — Encounter: Payer: Self-pay | Admitting: Cardiology

## 2011-09-29 ENCOUNTER — Other Ambulatory Visit: Payer: Self-pay | Admitting: Cardiology

## 2011-10-27 ENCOUNTER — Other Ambulatory Visit: Payer: Self-pay | Admitting: Dermatology

## 2011-10-29 ENCOUNTER — Encounter: Payer: Self-pay | Admitting: Cardiology

## 2011-12-07 ENCOUNTER — Other Ambulatory Visit: Payer: Self-pay | Admitting: Cardiology

## 2012-01-26 ENCOUNTER — Ambulatory Visit: Payer: Medicare Other | Admitting: Cardiology

## 2012-02-10 ENCOUNTER — Telehealth: Payer: Self-pay

## 2012-02-10 MED ORDER — CLOPIDOGREL BISULFATE 75 MG PO TABS: 75.0000 mg | ORAL_TABLET | Freq: Every day | ORAL | Status: DC

## 2012-02-10 NOTE — Telephone Encounter (Signed)
Pharmacist from [facility] called needing refill on plavix.Plavix 75 mg refilled # 30 refills x 6.

## 2012-03-02 ENCOUNTER — Ambulatory Visit: Payer: Medicare Other | Admitting: Cardiology

## 2012-03-30 ENCOUNTER — Encounter: Payer: Self-pay | Admitting: Cardiology

## 2012-03-30 ENCOUNTER — Ambulatory Visit (INDEPENDENT_AMBULATORY_CARE_PROVIDER_SITE_OTHER): Payer: Medicare Other | Admitting: Cardiology

## 2012-03-30 VITALS — BP 148/80 | HR 80 | Ht 75.0 in | Wt 184.8 lb

## 2012-03-30 DIAGNOSIS — I10 Essential (primary) hypertension: Secondary | ICD-10-CM

## 2012-03-30 DIAGNOSIS — I493 Ventricular premature depolarization: Secondary | ICD-10-CM

## 2012-03-30 DIAGNOSIS — I2510 Atherosclerotic heart disease of native coronary artery without angina pectoris: Secondary | ICD-10-CM

## 2012-03-30 DIAGNOSIS — I519 Heart disease, unspecified: Secondary | ICD-10-CM

## 2012-03-30 DIAGNOSIS — I4949 Other premature depolarization: Secondary | ICD-10-CM

## 2012-03-30 MED ORDER — ALPRAZOLAM 0.5 MG PO TABS: 0.5000 mg | ORAL_TABLET | Freq: Every evening | ORAL | Status: DC | PRN

## 2012-03-30 NOTE — Patient Instructions (Signed)
Continue your current medication.  I will see you again in 6 months.   
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
## 2012-06-02 ENCOUNTER — Other Ambulatory Visit: Payer: Self-pay | Admitting: Cardiology

## 2012-06-30 ENCOUNTER — Other Ambulatory Visit: Payer: Self-pay | Admitting: Cardiology

## 2012-08-18 ENCOUNTER — Encounter (HOSPITAL_COMMUNITY): Payer: Self-pay | Admitting: Emergency Medicine

## 2012-08-18 ENCOUNTER — Inpatient Hospital Stay (HOSPITAL_COMMUNITY)
Admission: EM | Admit: 2012-08-18 | Discharge: 2012-08-21 | DRG: 287 | Disposition: A | Discharge status: Home / Self Care | Payer: Medicare Other | Attending: Cardiology | Admitting: Cardiology

## 2012-08-18 ENCOUNTER — Telehealth: Payer: Self-pay | Admitting: Cardiology

## 2012-08-18 ENCOUNTER — Emergency Department (HOSPITAL_COMMUNITY): Payer: Medicare Other

## 2012-08-18 DIAGNOSIS — R079 Chest pain, unspecified: Secondary | ICD-10-CM

## 2012-08-18 DIAGNOSIS — E785 Hyperlipidemia, unspecified: Secondary | ICD-10-CM | POA: Diagnosis present

## 2012-08-18 DIAGNOSIS — I2582 Chronic total occlusion of coronary artery: Secondary | ICD-10-CM | POA: Diagnosis present

## 2012-08-18 DIAGNOSIS — I739 Peripheral vascular disease, unspecified: Secondary | ICD-10-CM | POA: Diagnosis present

## 2012-08-18 DIAGNOSIS — I2589 Other forms of chronic ischemic heart disease: Secondary | ICD-10-CM | POA: Diagnosis present

## 2012-08-18 DIAGNOSIS — Z720 Tobacco use: Secondary | ICD-10-CM

## 2012-08-18 DIAGNOSIS — T82897A Other specified complication of cardiac prosthetic devices, implants and grafts, initial encounter: Secondary | ICD-10-CM | POA: Diagnosis present

## 2012-08-18 DIAGNOSIS — I519 Heart disease, unspecified: Secondary | ICD-10-CM

## 2012-08-18 DIAGNOSIS — Z87891 Personal history of nicotine dependence: Secondary | ICD-10-CM

## 2012-08-18 DIAGNOSIS — Z7902 Long term (current) use of antithrombotics/antiplatelets: Secondary | ICD-10-CM

## 2012-08-18 DIAGNOSIS — Y849 Medical procedure, unspecified as the cause of abnormal reaction of the patient, or of later complication, without mention of misadventure at the time of the procedure: Secondary | ICD-10-CM | POA: Diagnosis present

## 2012-08-18 DIAGNOSIS — I200 Unstable angina: Secondary | ICD-10-CM | POA: Diagnosis present

## 2012-08-18 DIAGNOSIS — I2510 Atherosclerotic heart disease of native coronary artery without angina pectoris: Principal | ICD-10-CM | POA: Diagnosis present

## 2012-08-18 DIAGNOSIS — I255 Ischemic cardiomyopathy: Secondary | ICD-10-CM

## 2012-08-18 DIAGNOSIS — I10 Essential (primary) hypertension: Secondary | ICD-10-CM | POA: Diagnosis present

## 2012-08-18 DIAGNOSIS — I480 Paroxysmal atrial fibrillation: Secondary | ICD-10-CM

## 2012-08-18 DIAGNOSIS — Z85118 Personal history of other malignant neoplasm of bronchus and lung: Secondary | ICD-10-CM

## 2012-08-18 DIAGNOSIS — J4489 Other specified chronic obstructive pulmonary disease: Secondary | ICD-10-CM | POA: Diagnosis present

## 2012-08-18 DIAGNOSIS — I471 Supraventricular tachycardia, unspecified: Secondary | ICD-10-CM | POA: Diagnosis not present

## 2012-08-18 DIAGNOSIS — C3490 Malignant neoplasm of unspecified part of unspecified bronchus or lung: Secondary | ICD-10-CM | POA: Diagnosis present

## 2012-08-18 DIAGNOSIS — I4891 Unspecified atrial fibrillation: Secondary | ICD-10-CM | POA: Diagnosis present

## 2012-08-18 DIAGNOSIS — Z7982 Long term (current) use of aspirin: Secondary | ICD-10-CM

## 2012-08-18 DIAGNOSIS — D509 Iron deficiency anemia, unspecified: Secondary | ICD-10-CM | POA: Diagnosis present

## 2012-08-18 DIAGNOSIS — Z79899 Other long term (current) drug therapy: Secondary | ICD-10-CM

## 2012-08-18 DIAGNOSIS — J449 Chronic obstructive pulmonary disease, unspecified: Secondary | ICD-10-CM | POA: Diagnosis present

## 2012-08-18 LAB — BASIC METABOLIC PANEL
CO2: 30 mEq/L (ref 19–32)
Glucose, Bld: 99 mg/dL (ref 70–99)
Potassium: 4 mEq/L (ref 3.5–5.1)
Sodium: 132 mEq/L — ABNORMAL LOW (ref 135–145)

## 2012-08-18 LAB — CBC
Hemoglobin: 14.3 g/dL (ref 13.0–17.0)
RBC: 4.78 MIL/uL (ref 4.22–5.81)

## 2012-08-18 LAB — POCT I-STAT TROPONIN I

## 2012-08-18 MED ORDER — NITROGLYCERIN IN D5W 200-5 MCG/ML-% IV SOLN
10.0000 ug/min | INTRAVENOUS | Status: DC
  Administered 2012-08-18: 20 ug/min via INTRAVENOUS
  Administered 2012-08-18: 10 ug/min via INTRAVENOUS
  Filled 2012-08-18: qty 250

## 2012-08-18 NOTE — Telephone Encounter (Signed)
Returned call to patient he stated he has been having chest pain,chest pressure off and on for 1 week.Stated he is dizzy, pain in left shoulder.Stated he is under a lot of stress takes care of his wife who has alzheimer's.Patient advised he needs to call 911 and go to [facility] ER.Patient stated he could not leave wife.Patient's daughter called she stated she has been trying to get her father to go to ER and he has refused.Stated she would call 911 and go over to her father's house.

## 2012-08-18 NOTE — ED Provider Notes (Signed)
History    CSN: [id number] Arrival date & time 08/18/12  1954  First MD Initiated Contact with Patient 08/18/12 2002     Chief Complaint  Patient presents with  . Chest Pain   (Consider location/radiation/quality/duration/timing/severity/associated sxs/prior Treatment) HPI Comments: Patient presents today for evaluation of chest pain. Patient reports that he has been having intermittent chest pain for approximately 2 weeks. Patient describes the pain as a heaviness, like something sitting on his chest. The pain is associated with mild shortness of breath and he gets diaphoretic. Patient reports a previous history of heart disease with stenting. He also has a history of PE, but states that the pain is expected today is very different than when he had a PE. Patient comes by ambulance. Patient was given aspirin, but at that time they picked him up he was not experiencing pain and did not get any nitroglycerin. Patient is still pain-free at time of arrival to the ER.  Patient is a 79 y.o. male presenting with chest pain.  Chest Pain Associated symptoms: diaphoresis and shortness of breath    Past Medical History  Diagnosis Date  . CAD (coronary artery disease)   . PVC (premature ventricular contraction)   . HTN (hypertension)   . Lung cancer     non small cell  . Paroxysmal a-fib   . PAD (peripheral artery disease)   . COPD (chronic obstructive pulmonary disease)   . LV dysfunction   . Anemia, iron deficiency    Past Surgical History  Procedure Laterality Date  . Lobectomy      left upper  . Hernia repair      x5  . Appendectomy    . Back surgery    . Cervical spine surgery    . Twenty four hour intraesophageal ph profile.  05/02/2001  . Aortogram with bilateral lower extremity runoff.  07/11/2003  . Fiberoptic bronchoscopy, mediastinum.  10/20/2005  . Left video-assisted thoracoscopic surgery, left  12/01/2005  . Esophagogastroduodenoscopy with biopsy.  04/14/2010    History reviewed. No pertinent family history. History  Substance Use Topics  . Smoking status: Current Some Day Smoker -- 0.50 packs/day  . Smokeless tobacco: Not on file  . Alcohol Use: No    Review of Systems  Constitutional: Positive for diaphoresis.  Respiratory: Positive for shortness of breath.   Cardiovascular: Positive for chest pain.  All other systems reviewed and are negative.    Allergies  Sulfa drugs cross reactors  Home Medications   Current Outpatient Rx  Name  Route  Sig  Dispense  Refill  . ALPRAZolam (XANAX) 0.5 MG tablet   Oral   Take 1 tablet (0.5 mg total) by mouth at bedtime as needed.   30 tablet   0   . amLODipine (NORVASC) 5 MG tablet      TAKE 1 TABLET DAILY   30 tablet   11   . aspirin 81 MG tablet   Oral   Take 81 mg by mouth daily.           . clopidogrel (PLAVIX) 75 MG tablet      TAKE 1 TABLET DAILY   30 tablet   5   . esomeprazole (NEXIUM) 40 MG capsule   Oral   Take 40 mg by mouth daily before breakfast.           . ezetimibe (ZETIA) 10 MG tablet   Oral   Take 1 tablet (10 mg total) by mouth daily.  30 tablet   11   . felodipine (PLENDIL) 5 MG 24 hr tablet   Oral   Take 5 mg by mouth daily.           . fexofenadine (ALLEGRA) 180 MG tablet   Oral   Take 180 mg by mouth daily.           . IMDUR 60 MG 24 hr tablet      TAKE  (1)  TABLET TWICE A DAY.   60 each   5   . NITROGLYCERIN PO   Oral   Take by mouth as needed.           . Polyethylene Glycol 3350 (MIRALAX PO)   Oral   Take by mouth daily.           . ramipril (ALTACE) 5 MG tablet   Oral   Take 5 mg by mouth daily.            BP 169/81  Pulse 70  Temp(Src) 98.4 F (36.9 C) (Oral)  Resp 19  SpO2 99% Physical Exam  Constitutional: He is oriented to person, place, and time. He appears well-developed and well-nourished. No distress.  HENT:  Head: Normocephalic and atraumatic.  Right Ear: Hearing normal.  Left Ear: Hearing  normal.  Nose: Nose normal.  Mouth/Throat: Oropharynx is clear and moist and mucous membranes are normal.  Eyes: Conjunctivae and EOM are normal. Pupils are equal, round, and reactive to light.  Neck: Normal range of motion. Neck supple.  Cardiovascular: Regular rhythm, S1 normal and S2 normal.  Exam reveals no gallop and no friction rub.   No murmur heard. Pulmonary/Chest: Effort normal and breath sounds normal. No respiratory distress. He exhibits no tenderness.  Abdominal: Soft. Normal appearance and bowel sounds are normal. There is no hepatosplenomegaly. There is no tenderness. There is no rebound, no guarding, no tenderness at McBurney's point and negative Murphy's sign. No hernia.  Musculoskeletal: Normal range of motion.  Neurological: He is alert and oriented to person, place, and time. He has normal strength. No cranial nerve deficit or sensory deficit. Coordination normal. GCS eye subscore is 4. GCS verbal subscore is 5. GCS motor subscore is 6.  Skin: Skin is warm, dry and intact. No rash noted. No cyanosis.  Psychiatric: He has a normal mood and affect. His speech is normal and behavior is normal. Thought content normal.    ED Course  Procedures (including critical care time)  EKG:  Date: 08/18/2012  Rate: 60  Rhythm: normal sinus rhythm  QRS Axis: right  Intervals: normal  ST/T Wave abnormalities: nonspecific ST/T changes  Conduction Disutrbances:right bundle branch block  Narrative Interpretation:   Old EKG Reviewed: none available    Labs Reviewed  BASIC METABOLIC PANEL - Abnormal; Notable for the following:    Sodium 132 (*)    GFR calc non Af Amer 85 (*)    All other components within normal limits  CBC  POCT I-STAT TROPONIN I   Dg Chest Port 1 View  08/18/2012   *RADIOLOGY REPORT*  Clinical Data: Chest pain.  History of lung cancer.  PORTABLE CHEST - 1 VIEW  Comparison: Most recent chest radiograph 10/22/2009.  Findings: Surgical clips left hilum with  diffuse emphysematous change both lungs.  No definite pneumothorax.  Chronic blunting left CP angle.  No definite hilar or mediastinal masses.  No infiltrates.  Within normal limits for cardiac size.  IMPRESSION: Stable appearance from priors.  COPD. No definite  active infiltrates.  Postsurgical change left hilum.  No definite recurrent tumor on this one-view upright portable chest radiograph.   Original Report Authenticated By: Davonna Belling, M.D.   Diagnosis: Chest pain concerning for unstable angina  MDM  Patient comes to the ER for evaluation of chest pain. He is a patient of about cardiology. From his most recent records:  "he has a history of coronary disease and is status post stenting of the left circumflex coronary in 2006 with 2 Taxus stents. His last cardiac catheterization in 2011 showed occlusion of the mid right coronary with collateral flow. In a 90% focal stenosis in the distal circumflex stent."  Patient has been medical management until this point. He has not had any symptoms until 2 weeks ago, now having recurrent episodes of chest pain is concerning for unstable angina. Workup shows no obvious ischemia or infarct. Troponin negative. Because of the patient's coronary artery status, however, cardiology has been consulted to evaluate patient for possible admission.    Gilda Crease, MD 08/18/12 2121
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
## 2012-08-18 NOTE — ED Notes (Signed)
Report attempted 

## 2012-08-18 NOTE — Telephone Encounter (Signed)
New Problem  Pt states he is having chest pain on and off.  He is not having chest pain right now, however he feels like he heart is skipping.

## 2012-08-18 NOTE — ED Notes (Signed)
[facility] presents with a 79 yo male from home with chest pain for past two weeks increasing in pain today.  Pt states that he is under a lot of stress at home and has a wife with Alzheimer's'.  Pt denied NTG from [facility] and when given 324 ASA stated that his pain resolved.  Hx of lung CA with LLL removal.

## 2012-08-19 ENCOUNTER — Encounter (HOSPITAL_COMMUNITY)
Admission: EM | Disposition: A | Discharge status: Home / Self Care | Payer: Self-pay | Source: Home / Self Care | Attending: Cardiology

## 2012-08-19 DIAGNOSIS — I2510 Atherosclerotic heart disease of native coronary artery without angina pectoris: Secondary | ICD-10-CM

## 2012-08-19 DIAGNOSIS — I200 Unstable angina: Secondary | ICD-10-CM | POA: Diagnosis present

## 2012-08-19 HISTORY — PX: LEFT HEART CATHETERIZATION WITH CORONARY ANGIOGRAM: SHX5451

## 2012-08-19 LAB — CBC
MCH: 29.4 pg (ref 26.0–34.0)
MCHC: 34.1 g/dL (ref 30.0–36.0)
Platelets: 166 10*3/uL (ref 150–400)

## 2012-08-19 LAB — BASIC METABOLIC PANEL
Calcium: 9.1 mg/dL (ref 8.4–10.5)
GFR calc non Af Amer: >90 mL/min (ref >90)
Sodium: 135 mEq/L (ref 135–145)

## 2012-08-19 LAB — MRSA PCR SCREENING: MRSA by PCR: NEGATIVE

## 2012-08-19 LAB — TROPONIN I
Troponin I: <0.3 ng/mL (ref <0.30)
Troponin I: <0.3 ng/mL (ref <0.30)

## 2012-08-19 SURGERY — LEFT HEART CATHETERIZATION WITH CORONARY ANGIOGRAM
Anesthesia: LOCAL

## 2012-08-19 MED ORDER — ASPIRIN 81 MG PO CHEW
CHEWABLE_TABLET | ORAL | Status: AC
  Administered 2012-08-19: 324 mg
  Filled 2012-08-19: qty 4

## 2012-08-19 MED ORDER — NITROGLYCERIN 0.2 MG/ML ON CALL CATH LAB
INTRAVENOUS | Status: AC
  Filled 2012-08-19: qty 1

## 2012-08-19 MED ORDER — DIAZEPAM 5 MG PO TABS
5.0000 mg | ORAL_TABLET | ORAL | Status: AC
  Administered 2012-08-19: 5 mg via ORAL
  Filled 2012-08-19: qty 1

## 2012-08-19 MED ORDER — HEPARIN SODIUM (PORCINE) 5000 UNIT/ML IJ SOLN
5000.0000 [IU] | Freq: Three times a day (TID) | INTRAMUSCULAR | Status: DC
  Administered 2012-08-19: 5000 [IU] via SUBCUTANEOUS
  Filled 2012-08-19 (×4): qty 1

## 2012-08-19 MED ORDER — SODIUM CHLORIDE 0.9 % IV SOLN
INTRAVENOUS | Status: AC
  Administered 2012-08-19: 12:00:00 via INTRAVENOUS

## 2012-08-19 MED ORDER — SODIUM CHLORIDE 0.9 % IV SOLN: 1.0000 mL/kg/h | INTRAVENOUS | Status: DC

## 2012-08-19 MED ORDER — LOSARTAN POTASSIUM 50 MG PO TABS
100.0000 mg | ORAL_TABLET | Freq: Every day | ORAL | Status: DC
  Administered 2012-08-20 – 2012-08-21 (×2): 100 mg via ORAL
  Filled 2012-08-19 (×2): qty 2

## 2012-08-19 MED ORDER — AMLODIPINE BESYLATE 5 MG PO TABS
5.0000 mg | ORAL_TABLET | Freq: Every day | ORAL | Status: DC
  Administered 2012-08-19: 5 mg via ORAL
  Filled 2012-08-19: qty 1

## 2012-08-19 MED ORDER — FENTANYL CITRATE 0.05 MG/ML IJ SOLN
INTRAMUSCULAR | Status: AC
  Filled 2012-08-19: qty 2

## 2012-08-19 MED ORDER — ALBUTEROL SULFATE HFA 108 (90 BASE) MCG/ACT IN AERS
2.0000 | INHALATION_SPRAY | Freq: Three times a day (TID) | RESPIRATORY_TRACT | Status: DC | PRN
Start: 2012-08-19 — End: 2012-08-21

## 2012-08-19 MED ORDER — ISOSORBIDE MONONITRATE ER 60 MG PO TB24
60.0000 mg | ORAL_TABLET | Freq: Every day | ORAL | Status: DC
  Administered 2012-08-19 – 2012-08-21 (×3): 60 mg via ORAL
  Filled 2012-08-19 (×3): qty 1

## 2012-08-19 MED ORDER — SODIUM CHLORIDE 0.9 % IJ SOLN: 3.0000 mL | INTRAMUSCULAR | Status: DC | PRN

## 2012-08-19 MED ORDER — ASPIRIN EC 325 MG PO TBEC
325.0000 mg | DELAYED_RELEASE_TABLET | Freq: Every day | ORAL | Status: DC
  Administered 2012-08-20 – 2012-08-21 (×2): 325 mg via ORAL
  Filled 2012-08-19 (×3): qty 1

## 2012-08-19 MED ORDER — HYDRALAZINE HCL 20 MG/ML IJ SOLN: 10.0000 mg | INTRAMUSCULAR | Status: DC | PRN

## 2012-08-19 MED ORDER — CLOPIDOGREL BISULFATE 75 MG PO TABS
75.0000 mg | ORAL_TABLET | Freq: Every day | ORAL | Status: DC
  Administered 2012-08-19 – 2012-08-21 (×3): 75 mg via ORAL
  Filled 2012-08-19 (×4): qty 1

## 2012-08-19 MED ORDER — SODIUM CHLORIDE 0.9 % IV SOLN: 250.0000 mL | INTRAVENOUS | Status: DC | PRN

## 2012-08-19 MED ORDER — ACETAMINOPHEN 500 MG PO TABS
250.0000 mg | ORAL_TABLET | Freq: Every day | ORAL | Status: DC | PRN
  Administered 2012-08-19: 250 mg via ORAL
  Filled 2012-08-19 (×2): qty 0.5

## 2012-08-19 MED ORDER — SODIUM CHLORIDE 0.9 % IJ SOLN
3.0000 mL | Freq: Two times a day (BID) | INTRAMUSCULAR | Status: DC
  Administered 2012-08-19: 3 mL via INTRAVENOUS

## 2012-08-19 MED ORDER — SODIUM CHLORIDE 0.9 % IJ SOLN
3.0000 mL | Freq: Two times a day (BID) | INTRAMUSCULAR | Status: DC
  Administered 2012-08-19 – 2012-08-21 (×5): 3 mL via INTRAVENOUS

## 2012-08-19 MED ORDER — AMLODIPINE BESYLATE 10 MG PO TABS
10.0000 mg | ORAL_TABLET | Freq: Every day | ORAL | Status: DC
  Administered 2012-08-20 – 2012-08-21 (×2): 10 mg via ORAL
  Filled 2012-08-19 (×2): qty 1

## 2012-08-19 MED ORDER — HEPARIN (PORCINE) IN NACL 2-0.9 UNIT/ML-% IJ SOLN
INTRAMUSCULAR | Status: AC
  Filled 2012-08-19: qty 1000

## 2012-08-19 MED ORDER — LOSARTAN POTASSIUM 50 MG PO TABS
50.0000 mg | ORAL_TABLET | Freq: Every day | ORAL | Status: DC
Start: 2012-08-19 — End: 2012-08-19
  Administered 2012-08-19: 50 mg via ORAL
  Filled 2012-08-19: qty 1

## 2012-08-19 MED ORDER — PANTOPRAZOLE SODIUM 40 MG PO TBEC
40.0000 mg | DELAYED_RELEASE_TABLET | Freq: Every day | ORAL | Status: DC
  Administered 2012-08-19 – 2012-08-21 (×3): 40 mg via ORAL
  Filled 2012-08-19 (×2): qty 1

## 2012-08-19 MED ORDER — LIDOCAINE HCL (PF) 1 % IJ SOLN
INTRAMUSCULAR | Status: AC
  Filled 2012-08-19: qty 30

## 2012-08-19 MED ORDER — ONDANSETRON HCL 4 MG/2ML IJ SOLN: 4.0000 mg | Freq: Four times a day (QID) | INTRAMUSCULAR | Status: DC | PRN

## 2012-08-19 MED ORDER — LORATADINE 10 MG PO TABS
10.0000 mg | ORAL_TABLET | Freq: Every day | ORAL | Status: DC
  Administered 2012-08-19 – 2012-08-21 (×3): 10 mg via ORAL
  Filled 2012-08-19 (×3): qty 1

## 2012-08-19 NOTE — Care Management Note (Signed)
    Page 1 of 1   08/19/2012     8:57:54 AM   CARE MANAGEMENT NOTE 08/19/2012  Patient:  [name], [name]   Account Number:  [id number]  Date Initiated:  08/19/2012  Documentation initiated by:  Junius Creamer  Subjective/Objective Assessment:   adm w ch pain, iv ntg     Action/Plan:   lives w wife, pcp michael badger   Anticipated DC Date:     Anticipated DC Plan:        DC Planning Services  CM consult      Choice offered to / List presented to:             Status of service:   Medicare Important Message given?   (If response is "NO", the following Medicare IM given date fields will be blank) Date Medicare IM given:   Date Additional Medicare IM given:    Discharge Disposition:    Per UR Regulation:  Reviewed for med. necessity/level of care/duration of stay  If discussed at Long Length of Stay Meetings, dates discussed:    Comments:

## 2012-08-19 NOTE — CV Procedure (Signed)
   Cardiac Catheterization Operative Report  [name] [id number] 6/27/201410:27 AM Eartha Inch, MD  Procedure Performed:  1. Left Heart Catheterization 2. Selective Coronary Angiography 3. Left ventricular angiogram  Operator: Verne Carrow, MD  Indication:  79 yo WM with history of CAD with prior stent placement,  HTN, HLD, ongoing tobacco abuse, lung cancer here with chest pain, negative cardiac markers. Last cath 2011 with severe disease in Circumflex stent, medical management at that time.                                     Procedure Details: The risks, benefits, complications, treatment options, and expected outcomes were discussed with the patient. The patient and/or family concurred with the proposed plan, giving informed consent. The patient was brought to the cath lab after IV hydration was begun and oral premedication was given. The patient was further sedated with Fentanyl. The right groin was prepped and draped in the usual manner. Using the modified Seldinger access technique, a 5 French sheath was placed in the right femoral artery. Standard diagnostic catheters were used to perform selective coronary angiography. A pigtail catheter was used to perform a left ventricular angiogram.  There were no immediate complications. The patient was taken to the recovery area in stable condition.   Hemodynamic Findings: Central aortic pressure: 167/77 Left ventricular pressure: 167/6/9  Angiographic Findings:  Left main:  Distal 30% stenosis.   Left Anterior Descending Artery: Large caliber vessel that courses to the apex. There is mild plaque in the proximal and mid vessel. The distal vessel has a 40% stenosis as it wraps the apex. The diagonal is moderate in caliber with mild plaque disease.   Circumflex Artery: Large caliber vessel with two stents in the mid vessel. The most distal stent has 100% occlusion. There is a moderate caliber obtuse marginal branch that  arises from the mid Circumflex just before the total occlusion. The distal AV groove Circumflex and smaller caliber second OM branch fill from left to left collaterals.   Right Coronary Artery: Large dominant vessel with 100% mid occlusion. The mid and distal vessel fills from left to right collaterals.   Left Ventricular Angiogram: LVEF=40-45%.   Impression: 1. Double vessel CAD with chronic total occlusion of RCA and Circumflex, no targets for PCI 2. Mild LV systolic dysfunction  Recommendations: Continue medical management.        Complications:  None. The patient tolerated the procedure well.

## 2012-08-19 NOTE — Interval H&P Note (Signed)
History and Physical Interval Note:  08/19/2012 9:50 AM  [name]  has presented today for surgery, with the diagnosis of cp  The various methods of treatment have been discussed with the patient and family. After consideration of risks, benefits and other options for treatment, the patient has consented to  Procedure(s): LEFT HEART CATHETERIZATION WITH CORONARY ANGIOGRAM (N/A) as a surgical intervention .  The patient's history has been reviewed, patient examined, no change in status, stable for surgery.  I have reviewed the patient's chart and labs.  Questions were answered to the patient's satisfaction.    Cath Lab Visit (complete for each Cath Lab visit)  Clinical Evaluation Leading to the Procedure:   ACS: no  Non-ACS:    Anginal Classification: CCS III  Anti-ischemic medical therapy: Minimal Therapy (1 class of medications)  Non-Invasive Test Results: No non-invasive testing performed  Prior CABG: No previous CABG        MCALHANY,CHRISTOPHER

## 2012-08-19 NOTE — Progress Notes (Signed)
TELEMETRY: Reviewed telemetry pt in NSR rate 60 bpm: Filed Vitals:   08/19/12 0353 08/19/12 0400 08/19/12 0500 08/19/12 0600  BP:  152/76 145/64 164/71  Pulse:  62 58 65  Temp: 97.6 F (36.4 C)     TempSrc: Oral     Resp:  22 20 16   Height:      Weight:      SpO2:  97% 97% 97%    Intake/Output Summary (Last 24 hours) at 08/19/12 0727 Last data filed at 08/19/12 0647  Gross per 24 hour  Intake 564.35 ml  Output   1825 ml  Net -1260.65 ml    SUBJECTIVE No further chest pain. Reports compliance with Plavix and amlodipine. Takes Zetia occasionally. History of statin intolerance.  LABS: Basic Metabolic Panel:  Recent Labs  40/98/11 2019 08/19/12 0550  NA 132* 135  K 4.0 3.6  CL 96 102  CO2 30 28  GLUCOSE 99 114*  BUN 10 8  CREATININE 0.78 0.67  CALCIUM 9.5 9.1   CBC:  Recent Labs  08/18/12 2019 08/19/12 0550  WBC 7.8 6.9  HGB 14.3 14.0  HCT 41.0 41.1  MCV 85.8 86.2  PLT 187 166   Cardiac Enzymes:  Recent Labs  08/19/12 0039 08/19/12 0550  TROPONINI <0.30 <0.30     Radiology/Studies:  Dg Chest Port 1 View  08/18/2012   *RADIOLOGY REPORT*  Clinical Data: Chest pain.  History of lung cancer.  PORTABLE CHEST - 1 VIEW  Comparison: Most recent chest radiograph 10/22/2009.  Findings: Surgical clips left hilum with diffuse emphysematous change both lungs.  No definite pneumothorax.  Chronic blunting left CP angle.  No definite hilar or mediastinal masses.  No infiltrates.  Within normal limits for cardiac size.  IMPRESSION: Stable appearance from priors.  COPD. No definite active infiltrates.  Postsurgical change left hilum.  No definite recurrent tumor on this one-view upright portable chest radiograph.   Original Report Authenticated By: Davonna Belling, M.D.   Ecg: NSR nonspecific IVCD. ST depression improved inferiorly.  PHYSICAL EXAM General: Well developed, well nourished, in no acute distress. Head: Normal Neck: Negative for carotid bruits. JVD not  elevated. Lungs: Clear bilaterally to auscultation without wheezes, rales, or rhonchi. Breathing is unlabored. Heart: RRR S1 S2 without murmurs, rubs, or gallops.  Abdomen: Soft, non-tender, non-distended with normoactive bowel sounds. No hepatomegaly. No rebound/guarding. No obvious abdominal masses. Msk:  Strength and tone appears normal for age. Extremities: No clubbing, cyanosis or edema.  Distal pedal pulses are 2+ and equal bilaterally. Neuro: Alert and oriented X 3. Moves all extremities spontaneously. Psych:  Responds to questions appropriately with a normal affect.  ASSESSMENT AND PLAN: 1. Unstable angina. Cardiac enzymes negative. On IV Ntg. Needs cardiac cath today. Prior stenting of the LCX. Known occlusion of RCA. The procedure and risks were reviewed including but not limited to death, myocardial infarction, stroke, arrythmias, bleeding, transfusion, emergency surgery, dye allergy, or renal dysfunction. The patient voices understanding and is agreeable to proceed. 2. HTN poorly controlled. Continue amlodipine. Poor candidate for beta blocker due to resting bradycardia. Will add ARB. 3. Hyperlipidemia. Intolerant of statins in the past. Last lipid panel in April looked good with LDL 91 on no meds. Patient has difficult time affording Zetia.  4. Tobacco abuse.  5. Lung CA s/p resection. Cxr stable.  6. Ischemic cardiomyopathy. EF 30-35%. Repeat Echo.   Principal Problem:   Unstable angina Active Problems:   CAD (coronary artery disease)   HTN (hypertension)  Lung cancer   PAD (peripheral artery disease)   LV dysfunction    Signed, Peter Swaziland MD,FACC 08/19/2012 7:27 AM

## 2012-08-19 NOTE — H&P (View-Only) (Signed)
 TELEMETRY: Reviewed telemetry pt in NSR rate 60 bpm: Filed Vitals:   08/19/12 0353 08/19/12 0400 08/19/12 0500 08/19/12 0600  BP:  152/76 145/64 164/71  Pulse:  62 58 65  Temp: 97.6 F (36.4 C)     TempSrc: Oral     Resp:  22 20 16  Height:      Weight:      SpO2:  97% 97% 97%    Intake/Output Summary (Last 24 hours) at 08/19/12 0727 Last data filed at 08/19/12 0647  Gross per 24 hour  Intake 564.35 ml  Output   1825 ml  Net -1260.65 ml    SUBJECTIVE No further chest pain. Reports compliance with Plavix and amlodipine. Takes Zetia occasionally. History of statin intolerance.  LABS: Basic Metabolic Panel:  Recent Labs  08/18/12 2019 08/19/12 0550  NA 132* 135  K 4.0 3.6  CL 96 102  CO2 30 28  GLUCOSE 99 114*  BUN 10 8  CREATININE 0.78 0.67  CALCIUM 9.5 9.1   CBC:  Recent Labs  08/18/12 2019 08/19/12 0550  WBC 7.8 6.9  HGB 14.3 14.0  HCT 41.0 41.1  MCV 85.8 86.2  PLT 187 166   Cardiac Enzymes:  Recent Labs  08/19/12 0039 08/19/12 0550  TROPONINI <0.30 <0.30     Radiology/Studies:  Dg Chest Port 1 View  08/18/2012   *RADIOLOGY REPORT*  Clinical Data: Chest pain.  History of lung cancer.  PORTABLE CHEST - 1 VIEW  Comparison: Most recent chest radiograph 10/22/2009.  Findings: Surgical clips left hilum with diffuse emphysematous change both lungs.  No definite pneumothorax.  Chronic blunting left CP angle.  No definite hilar or mediastinal masses.  No infiltrates.  Within normal limits for cardiac size.  IMPRESSION: Stable appearance from priors.  COPD. No definite active infiltrates.  Postsurgical change left hilum.  No definite recurrent tumor on this one-view upright portable chest radiograph.   Original Report Authenticated By: Aldred Curnes, M.D.   Ecg: NSR nonspecific IVCD. ST depression improved inferiorly.  PHYSICAL EXAM General: Well developed, well nourished, in no acute distress. Head: Normal Neck: Negative for carotid bruits. JVD not  elevated. Lungs: Clear bilaterally to auscultation without wheezes, rales, or rhonchi. Breathing is unlabored. Heart: RRR S1 S2 without murmurs, rubs, or gallops.  Abdomen: Soft, non-tender, non-distended with normoactive bowel sounds. No hepatomegaly. No rebound/guarding. No obvious abdominal masses. Msk:  Strength and tone appears normal for age. Extremities: No clubbing, cyanosis or edema.  Distal pedal pulses are 2+ and equal bilaterally. Neuro: Alert and oriented X 3. Moves all extremities spontaneously. Psych:  Responds to questions appropriately with a normal affect.  ASSESSMENT AND PLAN: 1. Unstable angina. Cardiac enzymes negative. On IV Ntg. Needs cardiac cath today. Prior stenting of the LCX. Known occlusion of RCA. The procedure and risks were reviewed including but not limited to death, myocardial infarction, stroke, arrythmias, bleeding, transfusion, emergency surgery, dye allergy, or renal dysfunction. The patient voices understanding and is agreeable to proceed. 2. HTN poorly controlled. Continue amlodipine. Poor candidate for beta blocker due to resting bradycardia. Will add ARB. 3. Hyperlipidemia. Intolerant of statins in the past. Last lipid panel in April looked good with LDL 91 on no meds. Patient has difficult time affording Zetia.  4. Tobacco abuse.  5. Lung CA s/p resection. Cxr stable.  6. Ischemic cardiomyopathy. EF 30-35%. Repeat Echo.   Principal Problem:   Unstable angina Active Problems:   CAD (coronary artery disease)   HTN (hypertension)     Lung cancer   PAD (peripheral artery disease)   LV dysfunction    Signed, Fanny Agan MD,FACC 08/19/2012 7:27 AM    

## 2012-08-20 ENCOUNTER — Inpatient Hospital Stay (HOSPITAL_COMMUNITY): Payer: Medicare Other

## 2012-08-20 DIAGNOSIS — I4891 Unspecified atrial fibrillation: Secondary | ICD-10-CM

## 2012-08-20 DIAGNOSIS — I519 Heart disease, unspecified: Secondary | ICD-10-CM

## 2012-08-20 DIAGNOSIS — I200 Unstable angina: Secondary | ICD-10-CM

## 2012-08-20 DIAGNOSIS — I10 Essential (primary) hypertension: Secondary | ICD-10-CM

## 2012-08-20 DIAGNOSIS — I2510 Atherosclerotic heart disease of native coronary artery without angina pectoris: Secondary | ICD-10-CM

## 2012-08-20 LAB — CBC
Hemoglobin: 13.5 g/dL (ref 13.0–17.0)
MCH: 29.6 pg (ref 26.0–34.0)
Platelets: 169 10*3/uL (ref 150–400)
WBC: 6.2 10*3/uL (ref 4.0–10.5)

## 2012-08-20 LAB — BASIC METABOLIC PANEL
BUN: 10 mg/dL (ref 6–23)
CO2: 25 mEq/L (ref 19–32)
Calcium: 9 mg/dL (ref 8.4–10.5)
Creatinine, Ser: 0.71 mg/dL (ref 0.50–1.35)

## 2012-08-20 MED ORDER — AMIODARONE HCL 200 MG PO TABS
200.0000 mg | ORAL_TABLET | Freq: Two times a day (BID) | ORAL | Status: DC
  Administered 2012-08-20 – 2012-08-21 (×3): 200 mg via ORAL
  Filled 2012-08-20 (×4): qty 1

## 2012-08-20 NOTE — Progress Notes (Addendum)
Primary cardiologist: Dr. Peter Swaziland  Subjective:   Complains of recurrent discomfort in his chest this morning. Interestingly, telemetry showed SVT at that time. Also complains of chronic headache, feeling of dizziness, and leg weakness.   Objective:   Temp:  [96.8 F (36 C)-98.2 F (36.8 C)] 98.2 F (36.8 C) (06/28 0500) Pulse Rate:  [54-76] 76 (06/28 0757) Resp:  [18-20] 18 (06/28 0500) BP: (120-163)/(53-76) 143/63 mmHg (06/28 0757) SpO2:  [96 %-99 %] 96 % (06/28 0500) Weight:  [182 lb 9.6 oz (82.827 kg)] 182 lb 9.6 oz (82.827 kg) (06/28 0500) Last BM Date: 08/18/12  Filed Weights   08/18/12 2350 08/20/12 0500  Weight: 181 lb 3.5 oz (82.2 kg) 182 lb 9.6 oz (82.827 kg)    Intake/Output Summary (Last 24 hours) at 08/20/12 1001 Last data filed at 08/19/12 1900  Gross per 24 hour  Intake    440 ml  Output    725 ml  Net   -285 ml   Telemetry: Intermittent SVT the 140s, rhythm not entirely clear, but regular.  Exam:  General: Chronically ill-appearing, no distress.  Lungs: Dminished, nonlabored.  Cardiac: RRR, no gallop.  Abdomen: NABS.  Extremities: No pitting.  Lab Results:  Basic Metabolic Panel:  Recent Labs Lab 08/18/12 2019 08/19/12 0550 08/20/12 0410  NA 132* 135 132*  K 4.0 3.6 3.9  CL 96 102 99  CO2 30 28 25   GLUCOSE 99 114* 98  BUN 10 8 10   CREATININE 0.78 0.67 0.71  CALCIUM 9.5 9.1 9.0    CBC:  Recent Labs Lab 08/18/12 2019 08/19/12 0550 08/20/12 0410  WBC 7.8 6.9 6.2  HGB 14.3 14.0 13.5  HCT 41.0 41.1 39.3  MCV 85.8 86.2 86.2  PLT 187 166 169    Cardiac Enzymes:  Recent Labs Lab 08/19/12 0039 08/19/12 0550 08/19/12 1313  TROPONINI <0.30 <0.30 <0.30     Medications:   Scheduled Medications: . amLODipine  10 mg Oral Daily  . aspirin EC  325 mg Oral Daily  . clopidogrel  75 mg Oral Q breakfast  . isosorbide mononitrate  60 mg Oral Daily  . loratadine  10 mg Oral Daily  . losartan  100 mg Oral Daily  .  pantoprazole  40 mg Oral Daily  . sodium chloride  3 mL Intravenous Q12H      PRN Medications:  sodium chloride, acetaminophen, albuterol, hydrALAZINE, ondansetron (ZOFRAN) IV, sodium chloride   Assessment:   1. Accelerating angina, cardiac markers argue against high-risk ACS. Cardiac catheterization yesterday revealed obstructive 2 vessel disease with no clear revascularization options, plan is for medical therapy. Question whether intermittent SVT may also be contributing.  2. PSVT, rhythm not entirely clear, heart rate to the 140s by telemetry, and patient did report symptoms during this time. Not on beta blocker due to concerns about bradycardia at times. History of PAF by chart, but rhythm is largely regular. Could be atrial flutter.  3. Coronary artery disease with occluded distal stent in the circumflex, occluded mid RCA with left to right collaterals, managed medically to  4. Chronic headache associated with dizziness and vertigo, also leg weakness. Patient concerned about this, mentioned at on rounds today.  5. History of ischemic cardiomyopathy, LVEF 35-40% as of 2011, reported to be 40-45% by recent ventriculogram. Echocardiogram pending.  6. History of COPD and lung cancer status post resection.   Plan/Discussion:    Reviewed work up to this point, discussed with patient. Question whether his angina symptoms  are also contributed to by PSVT. He is bradycardic at baseline, although not definitely symptomatic. Suppression of his arrhythmia may actually lead to further improvement in his symptoms, we will try low-dose amiodarone load for now, monitor heart rate closely on telemetry. Also plan head CT due to headache symptoms and dizziness as discussed above.   Jonelle Sidle, M.D., F.A.C.C.

## 2012-08-21 ENCOUNTER — Encounter (HOSPITAL_COMMUNITY): Payer: Self-pay | Admitting: Physician Assistant

## 2012-08-21 DIAGNOSIS — Z720 Tobacco use: Secondary | ICD-10-CM

## 2012-08-21 DIAGNOSIS — I255 Ischemic cardiomyopathy: Secondary | ICD-10-CM

## 2012-08-21 DIAGNOSIS — I471 Supraventricular tachycardia: Secondary | ICD-10-CM

## 2012-08-21 DIAGNOSIS — J449 Chronic obstructive pulmonary disease, unspecified: Secondary | ICD-10-CM

## 2012-08-21 LAB — CBC
HCT: 39.2 % (ref 39.0–52.0)
MCH: 29 pg (ref 26.0–34.0)
MCHC: 33.9 g/dL (ref 30.0–36.0)
MCV: 85.6 fL (ref 78.0–100.0)
Platelets: 160 10*3/uL (ref 150–400)
RDW: 14.9 % (ref 11.5–15.5)
WBC: 6.2 10*3/uL (ref 4.0–10.5)

## 2012-08-21 LAB — BASIC METABOLIC PANEL
BUN: 13 mg/dL (ref 6–23)
Calcium: 9.1 mg/dL (ref 8.4–10.5)
Chloride: 97 mEq/L (ref 96–112)
Creatinine, Ser: 0.63 mg/dL (ref 0.50–1.35)
GFR calc Af Amer: >90 mL/min (ref >90)
GFR calc non Af Amer: >90 mL/min (ref >90)

## 2012-08-21 MED ORDER — LOSARTAN POTASSIUM 100 MG PO TABS: 100.0000 mg | ORAL_TABLET | Freq: Every day | ORAL | Status: DC

## 2012-08-21 MED ORDER — PANTOPRAZOLE SODIUM 40 MG PO TBEC: 40.0000 mg | DELAYED_RELEASE_TABLET | Freq: Every day | ORAL | Status: DC

## 2012-08-21 MED ORDER — AMIODARONE HCL 200 MG PO TABS: ORAL_TABLET | ORAL | Status: DC

## 2012-08-21 MED ORDER — AMLODIPINE BESYLATE 10 MG PO TABS: 10.0000 mg | ORAL_TABLET | Freq: Every day | ORAL | Status: DC

## 2012-08-21 NOTE — Discharge Summary (Signed)
See my rounding note as well.

## 2012-08-21 NOTE — Discharge Summary (Signed)
Discharge Summary   Patient ID: [name],  MRN: [id number], DOB/AGE: 77-Mar-1935 77 y.o.  Admit date: 08/18/2012 Discharge date: 08/21/2012  Primary Physician: Eartha Inch, MD Primary Cardiologist: P. Swaziland, MD  Discharge Diagnoses Principal Problem:   Unstable angina Active Problems:   CAD (coronary artery disease)   HTN (hypertension)   Lung cancer   PAD (peripheral artery disease)   SVT (supraventricular tachycardia)   Cardiomyopathy, ischemic   Tobacco abuse   COPD (chronic obstructive pulmonary disease)   Allergies Allergies  Allergen Reactions  . Sulfa Drugs Cross Reactors Rash    Diagnostic Studies/Procedures  PORTABLE CHEST X-RAY - 08/18/12  IMPRESSION:  Stable appearance from priors. COPD. No definite active  infiltrates.  Postsurgical change left hilum. No definite recurrent tumor on  this one-view upright portable chest radiograph.  CARDIAC CATHETERIZATION - 08/19/12  Hemodynamic Findings:  Central aortic pressure: 167/77  Left ventricular pressure: 167/6/9  Angiographic Findings:  Left main: Distal 30% stenosis.  Left Anterior Descending Artery: Large caliber vessel that courses to the apex. There is mild plaque in the proximal and mid vessel. The distal vessel has a 40% stenosis as it wraps the apex. The diagonal is moderate in caliber with mild plaque disease.  Circumflex Artery: Large caliber vessel with two stents in the mid vessel. The most distal stent has 100% occlusion. There is a moderate caliber obtuse marginal branch that arises from the mid Circumflex just before the total occlusion. The distal AV groove Circumflex and smaller caliber second OM branch fill from left to left collaterals.  Right Coronary Artery: Large dominant vessel with 100% mid occlusion. The mid and distal vessel fills from left to right collaterals.  Left Ventricular Angiogram: LVEF=40-45%.  Impression:  1. Double vessel CAD with chronic total occlusion of RCA  and Circumflex, no targets for PCI  2. Mild LV systolic dysfunction  NONCONTRAST HEAD CT - 08/20/12  IMPRESSION:  1. Stable atrophy and white matter disease.  2. No acute intracranial abnormality.  History of Present Illness [name] is a 79 y.o. male who was admitted to [facility] on 08/18/12 with the above problem list.   He has a history of CAD (s/p DES-LCx in 2006, repeat cath 2011- medical management), ischemic myopathy (EF 30-35%), ongoing tobacco abuse, COPD, history of lung cancer and hypertension.  Prior cardiac catheterization 09/2009 revealed 20% left main, 20% proximal LAD, 40-50% proximal MRI, heavily calcified LCx, 30-40% ostial LCx, widely patent LCx DES, 90% distal LCx focal ISR, heavily calcified RCA, 100% mid RCA with left to right collaterals; EF 35% with inferior hypokinesis. Plan-enhanced BP control and possible PCI of distal LCx refractory angina.  He is the primary caregiver for his wife. He had lifted her and transported to the commode when he began to experience chest pressure and lightheadedness/presyncope. He had similar symptoms over the prior 2 weeks leading up to the admission. He had admitted to increased stress caring for his wife with Alzheimer's. Given worsening discomfort, he presented to the emergency department.  There, an initial EKG revealed marked inferolateral ST/T wave changes concerning for ischemia. Initial troponin I returned within normal limits. Chest x-ray as above revealed stable postsurgical changes, COPD and otherwise no acute abnormalities. The med and CBC were largely unremarkable. Given the patient's cardiac history, significant cardiac risk factors and history concerning for unstable angina, and the decision was made to proceed with admission and diagnostic cardiac catheterization.  Hospital Course   He was placed on  a nitroglycerin infusion with improvement in his chest discomfort. 3 subsequent sets of troponin I returned within  normal limits. He was evaluated by Dr. Swaziland the following morning who agreed with diagnostic cardiac catheterization. The risks, benefits and details were discussed with the patient who agreed to proceed.   He was prepped for cardiac catheterization on 08/19/12. Access was obtained via the right femoral artery. As above this revealed 30% distal left main, mild proximal, mid LAD plaque, 40% distal LAD, mild diagonal plaque, 100% distal LCx stent occlusion, 100% mid RCA occlusion with left-to-right collateralization; EF 40-45%. The decision was made to continue medical management. The patient tolerated the procedure well without complications.  She was observed overnight and did have transient episodes of SVT. The suspicion was for paroxysmal SVT accounting for increased demand and subsequent angina in this patient. The patient was noted to have sinus bradycardia at baseline precluding beta blocker or Alcian channel blocker therapy for rate control. The decision was made to proceed with a trial of amiodarone were SVT suppression. He was started on a loading dose of 200 mg by mouth twice a day.  The patient also complained of a headache and dizziness. He noted the symptoms to be chronic, Iva, noncontrast head CT was obtained with revealed stable atrophy and otherwise no acute findings.  The patient remained stable overnight with no further episodes of SVT. He was evaluated by Dr. Diona Browner deemed stable for discharge. He will be discharged on the medication regimen outlined below. He will continue on amiodarone loading dose for the next 2 weeks and then reduce to daily dose as specified on his prescription. He will also need outpatient LFTs and TFTs. Additionally, a 2-D echocardiogram was recommended this admission however was not performed. This may be pursued as an outpatient given his underlying ischemic cardiomyopathy; however, LV gram showed improved EF with no motion abnormalities this admission. He will  followup in approximately 2 weeks with Dr. Swaziland for PA/NP in the office. His information, including post cath instructions and activity restrictions, has been clear he outlined in the discharge AVS.   Discharge Vitals:  Blood pressure 133/55, pulse 57, temperature 98.2 F (36.8 C), temperature source Oral, resp. rate 18, height 6\' 3"  (1.905 m), weight 83.144 kg (183 lb 4.8 oz), SpO2 97.00%.   Weight change: 0.318 kg (11.2 oz)  Labs: Recent Labs     08/20/12  0410  08/21/12  0550  WBC  6.2  6.2  HGB  13.5  13.3  HCT  39.3  39.2  MCV  86.2  85.6  PLT  169  160    Recent Labs Lab 08/19/12 0550 08/20/12 0410 08/21/12 0550  NA 135 132* 130*  K 3.6 3.9 3.7  CL 102 99 97  CO2 28 25 27   BUN 8 10 13   CREATININE 0.67 0.71 0.63  CALCIUM 9.1 9.0 9.1  GLUCOSE 114* 98 101*   Recent Labs     08/19/12  0039  08/19/12  0550  08/19/12  1313  TROPONINI  <0.30  <0.30  <0.30   Disposition:  Discharge Orders   Future Orders Complete By Expires     Diet - low sodium heart healthy  As directed     Increase activity slowly  As directed           Follow-up Information   Follow up with [facility]. (Office will call you with an appointment date & time in about 2 weeks. )    Contact  information:   [address] [place] [address]       Follow up with Eartha Inch, MD. Schedule an appointment as soon as possible for a visit in 1 week. (For post-hospital follow-up. )    Contact information:   [address] [place] [address] [phone]      Discharge Medications:    Medication List    STOP taking these medications       celecoxib 200 MG capsule  Commonly known as:  CELEBREX     esomeprazole 40 MG capsule  Commonly known as:  NEXIUM  Replaced by:  pantoprazole 40 MG tablet      TAKE these medications       acetaminophen 500 MG tablet  Commonly known as:  TYLENOL  Take 250 mg by mouth daily as needed for pain.     albuterol 108  (90 BASE) MCG/ACT inhaler  Commonly known as:  PROVENTIL HFA;VENTOLIN HFA  Inhale 2 puffs into the lungs 3 (three) times daily as needed for wheezing or shortness of breath.     amiodarone 200 MG tablet  Commonly known as:  PACERONE  Take 1 tablet (200mg ) total by mouth 2 (two) times daily until 09/04/12, then reduce to 1 tablet (200mg  total) by mouth once daily.     amLODipine 10 MG tablet  Commonly known as:  NORVASC  Take 1 tablet (10 mg total) by mouth daily.     aspirin EC 81 MG tablet  Take 81 mg by mouth as needed (chest pain).     clopidogrel 75 MG tablet  Commonly known as:  PLAVIX  Take 75 mg by mouth daily.     ezetimibe 10 MG tablet  Commonly known as:  ZETIA  Take 10 mg by mouth as needed (when he thinks cholesterol is going up).     fexofenadine 180 MG tablet  Commonly known as:  ALLEGRA  Take 180 mg by mouth daily as needed (allergies).     isosorbide mononitrate 60 MG 24 hr tablet  Commonly known as:  IMDUR  Take 60 mg by mouth every evening.     losartan 100 MG tablet  Commonly known as:  COZAAR  Take 1 tablet (100 mg total) by mouth daily.     nitroGLYCERIN 0.4 MG SL tablet  Commonly known as:  NITROSTAT  Place 0.4 mg under the tongue every 5 (five) minutes as needed for chest pain.     pantoprazole 40 MG tablet  Commonly known as:  PROTONIX  Take 1 tablet (40 mg total) by mouth daily.     polyethylene glycol packet  Commonly known as:  MIRALAX / GLYCOLAX  Take 4-17 g by mouth every evening.     PRESCRIPTION MEDICATION  Place 2 drops into both eyes 2 (two) times daily as needed (irritated eyes). Eye drop samples from doctor's office       Outstanding Labs/Studies: LFTs, TFTs in 6 weeks; possible echocardiogram TBD on follow-up  Duration of Discharge Encounter: Greater than 30 minutes including physician time.  Signed, R. Hurman Horn, PA-C 08/21/2012, 4:30 PM

## 2012-08-21 NOTE — Progress Notes (Signed)
Primary cardiologist: Dr. Peter Swaziland  Subjective:   Feels better in general, up in room this morning. No palpitations or chest pain overnight.   Objective:   Temp:  [97.6 F (36.4 C)-98.2 F (36.8 C)] 98.2 F (36.8 C) (06/29 0500) Pulse Rate:  [57-100] 57 (06/29 0500) Resp:  [16-20] 18 (06/29 0500) BP: (120-144)/(55-74) 133/55 mmHg (06/29 0500) SpO2:  [97 %-100 %] 97 % (06/29 0500) Weight:  [183 lb 4.8 oz (83.144 kg)] 183 lb 4.8 oz (83.144 kg) (06/29 0500) Last BM Date: 08/20/12  Filed Weights   08/18/12 2350 08/20/12 0500 08/21/12 0500  Weight: 181 lb 3.5 oz (82.2 kg) 182 lb 9.6 oz (82.827 kg) 183 lb 4.8 oz (83.144 kg)   No intake or output data in the 24 hours ending 08/21/12 1008  Telemetry: No further SVT.  Exam:  General: Chronically ill-appearing, no distress.  Lungs: Dminished, nonlabored.  Cardiac: RRR, no gallop.  Abdomen: NABS.  Extremities: No pitting.  Lab Results:  Basic Metabolic Panel:  Recent Labs Lab 08/19/12 0550 08/20/12 0410 08/21/12 0550  NA 135 132* 130*  K 3.6 3.9 3.7  CL 102 99 97  CO2 28 25 27   GLUCOSE 114* 98 101*  BUN 8 10 13   CREATININE 0.67 0.71 0.63  CALCIUM 9.1 9.0 9.1    CBC:  Recent Labs Lab 08/19/12 0550 08/20/12 0410 08/21/12 0550  WBC 6.9 6.2 6.2  HGB 14.0 13.5 13.3  HCT 41.1 39.3 39.2  MCV 86.2 86.2 85.6  PLT 166 169 160    Cardiac Enzymes:  Recent Labs Lab 08/19/12 0039 08/19/12 0550 08/19/12 1313  TROPONINI <0.30 <0.30 <0.30   Head CT 6/28: IMPRESSION:  1. Stable atrophy and white matter disease. 2. No acute intracranial abnormality.  ECG 6/29: Sinus bradycardia with IVCD and nonspecific ST changes.  Medications:   Scheduled Medications: . amiodarone  200 mg Oral BID  . amLODipine  10 mg Oral Daily  . aspirin EC  325 mg Oral Daily  . clopidogrel  75 mg Oral Q breakfast  . isosorbide mononitrate  60 mg Oral Daily  . loratadine  10 mg Oral Daily  . losartan  100 mg Oral Daily  .  pantoprazole  40 mg Oral Daily  . sodium chloride  3 mL Intravenous Q12H     PRN Medications: sodium chloride, acetaminophen, albuterol, hydrALAZINE, ondansetron (ZOFRAN) IV, sodium chloride   Assessment:   1. Accelerating angina, cardiac markers argue against high-risk ACS. Cardiac catheterization 6/28 revealed obstructive 2 vessel disease with no clear revascularization options, plan is for medical therapy. Question whether intermittent SVT may also be contributing. Feels better today.  2. PSVT, rhythm not entirely clear, heart rate to the 140s by telemetry, and patient did report symptoms during this time. Not on beta blocker due to concerns about bradycardia at times. History of PAF by chart, but rhythm is largely regular. Could be atrial flutter.  3. Coronary artery disease with occluded distal stent in the circumflex, occluded mid RCA with left to right collaterals, managed medically.  4. Chronic headache associated with dizziness and vertigo, also leg weakness. Head CT negative for acute event.  5. History of ischemic cardiomyopathy, LVEF 35-40% as of 2011, reported to be 40-45% by recent ventriculogram.  6. History of COPD and lung cancer status post resection.   Plan/Discussion:    Reviewed results of head CT with patient, also discussed telemetry with recently stable heart rhythm on amiodarone. He is tolerating this so far, willl  likely discharge home today on low-dose amiodarone 200 mg twice daily, 2 week course then reduce to once daily. Otherwise continue current cardiac regimen. He will need close followup with Dr. Swaziland in the next 2 weeks.   Jonelle Sidle, M.D., F.A.C.C.

## 2012-09-12 ENCOUNTER — Encounter: Payer: Self-pay | Admitting: Nurse Practitioner

## 2012-09-12 ENCOUNTER — Ambulatory Visit (INDEPENDENT_AMBULATORY_CARE_PROVIDER_SITE_OTHER): Payer: Medicare Other | Admitting: Nurse Practitioner

## 2012-09-12 VITALS — BP 150/70 | HR 52 | Ht 75.0 in | Wt 191.0 lb

## 2012-09-12 DIAGNOSIS — I498 Other specified cardiac arrhythmias: Secondary | ICD-10-CM

## 2012-09-12 DIAGNOSIS — I471 Supraventricular tachycardia: Secondary | ICD-10-CM

## 2012-09-12 DIAGNOSIS — I2510 Atherosclerotic heart disease of native coronary artery without angina pectoris: Secondary | ICD-10-CM

## 2012-09-12 DIAGNOSIS — Z79899 Other long term (current) drug therapy: Secondary | ICD-10-CM

## 2012-09-12 MED ORDER — EZETIMIBE 10 MG PO TABS: 10.0000 mg | ORAL_TABLET | ORAL | Status: DC | PRN

## 2012-09-12 MED ORDER — POLYETHYLENE GLYCOL 3350 17 G PO PACK: 4.0000 g | PACK | Freq: Every evening | ORAL | Status: DC

## 2012-09-12 NOTE — Patient Instructions (Addendum)
We need to check an EKG today  We need to check labs today - go downstairs to [facility].   We need to get an ultrasound of your heart for follow up.   Stay on your current medicines  I have refilled your Zetia today and the Miralax  See Dr. Swaziland in 2 months with a repeat EKG  Call the [facility] office at [phone] if you have any questions, problems or concerns.

## 2012-09-12 NOTE — Progress Notes (Addendum)
[name] Date of Birth: 1933/12/21 Medical Record #[id number]  History of Present Illness: [name] is seen back today for a post hospital visit. Seen for [name]. He has known CAD with prior DES to LCX in 2006, repeat cath in 2011 - managed medically, ischemic CM with EF 30 to 35%, ongoing tobacco abuse, SVT - on amiodarone, COPD, history of lung cancer and HTN.   He is the primary caregiver for his wife with advanced Alzheimer's.   Most recently admitted with worsening chest pain - had been lifting and transporting his wife to the [place]liet. Had had symptoms for 2 weeks. Admitted were initial EKG showed marked inferolateral ST/T wave changes. Troponin was normal. Cardiac cath was undertaken - results as below - continued medical management recommended. Was noted to have paroxsymal SVT accounting for increased demand ischemia and subsequent angina - placed on amiodarone.   Comes in today. Here alone. Doing ok. Still smoking. Still under lots of stress. Still taking care of his wife. His chest pain is a little better. No palpitations reported. Not dizzy or lightheaded. Out of his Zetia and Miralax. Seems to be tolerating his medicines.   Current Outpatient Prescriptions  Medication Sig Dispense Refill  . acetaminophen (TYLENOL) 500 MG tablet Take 250 mg by mouth daily as needed for pain.      Marland Kitchen albuterol (PROVENTIL HFA;VENTOLIN HFA) 108 (90 BASE) MCG/ACT inhaler Inhale 2 puffs into the lungs 3 (three) times daily as needed for wheezing or shortness of breath.      Marland Kitchen amiodarone (PACERONE) 200 MG tablet Take 1 tablet (200mg ) total by mouth 2 (two) times daily until 09/04/12, then reduce to 1 tablet (200mg  total) by mouth once daily.  42 tablet  3  . amLODipine (NORVASC) 10 MG tablet Take 1 tablet (10 mg total) by mouth daily.  30 tablet  3  . aspirin EC 81 MG tablet Take 81 mg by mouth as needed (chest pain).      Marland Kitchen clopidogrel (PLAVIX) 75 MG tablet Take 75 mg by mouth daily.      .  fexofenadine (ALLEGRA) 180 MG tablet Take 180 mg by mouth daily as needed (allergies).       . isosorbide mononitrate (IMDUR) 60 MG 24 hr tablet Take 60 mg by mouth every evening.      Marland Kitchen losartan (COZAAR) 100 MG tablet Take 1 tablet (100 mg total) by mouth daily.  30 tablet  3  . nitroGLYCERIN (NITROSTAT) 0.4 MG SL tablet Place 0.4 mg under the tongue every 5 (five) minutes as needed for chest pain.      . pantoprazole (PROTONIX) 40 MG tablet Take 1 tablet (40 mg total) by mouth daily.  30 tablet  3  . polyethylene glycol (MIRALAX / GLYCOLAX) packet Take 4-17 g by mouth every evening.       Marland Kitchen PRESCRIPTION MEDICATION Place 2 drops into both eyes 2 (two) times daily as needed (irritated eyes). Eye drop samples from doctor's office      . ezetimibe (ZETIA) 10 MG tablet Take 10 mg by mouth as needed (when he thinks cholesterol is going up).       No current facility-administered medications for this visit.    Allergies  Allergen Reactions  . Sulfa Drugs Cross Reactors Rash    Past Medical History  Diagnosis Date  . CAD (coronary artery disease)     a. s/p DES-LCx 2006 b. repeat cath 2011- 90% distal LCx ISR, 100% mid RCA  CTO, diffuse nonobstructive dz, medical management c. 07/2012 cath- 100% distal LCx ISR, 100% mid RCA CTO, nonobstructive dz; EF 40-45%  . PVC (premature ventricular contraction)   . HTN (hypertension)   . Lung cancer     non small cell  . Paroxysmal a-fib   . PAD (peripheral artery disease)   . COPD (chronic obstructive pulmonary disease)   . LV dysfunction   . Anemia, iron deficiency     Past Surgical History  Procedure Laterality Date  . Lobectomy      left upper  . Hernia repair      x5  . Appendectomy    . Back surgery    . Cervical spine surgery    . Twenty four hour intraesophageal ph profile.  05/02/2001  . Aortogram with bilateral lower extremity runoff.  07/11/2003  . Fiberoptic bronchoscopy, mediastinum.  10/20/2005  . Left video-assisted  thoracoscopic surgery, left  12/01/2005  . Esophagogastroduodenoscopy with biopsy.  04/14/2010    History  Smoking status  . Current Some Day Smoker -- 0.50 packs/day  Smokeless tobacco  . Not on file    History  Alcohol Use No    History reviewed. No pertinent family history.  Review of Systems: The review of systems is per the HPI.  All other systems were reviewed and are negative.  Physical Exam: BP 150/70  Pulse 52  Ht 6\' 3"  (1.905 m)  Wt 191 lb (86.637 kg)  BMI 23.87 kg/m2 Patient is very pleasant and in no acute distress. Skin is warm and dry. Color is normal.  HEENT is unremarkable. Normocephalic/atraumatic. PERRL. Sclera are nonicteric. Neck is supple. No masses. No JVD. Lungs are coarse. Cardiac exam shows a regular rate and rhythm. Abdomen is soft. Extremities are without edema. Gait and ROM are intact. No gross neurologic deficits noted.   LABORATORY DATA: PENDING  Angiographic Findings:   Left main: Distal 30% stenosis.  Left Anterior Descending Artery: Large caliber vessel that courses to the apex. There is mild plaque in the proximal and mid vessel. The distal vessel has a 40% stenosis as it wraps the apex. The diagonal is moderate in caliber with mild plaque disease.  Circumflex Artery: Large caliber vessel with two stents in the mid vessel. The most distal stent has 100% occlusion. There is a moderate caliber obtuse marginal branch that arises from the mid Circumflex just before the total occlusion. The distal AV groove Circumflex and smaller caliber second OM branch fill from left to left collaterals.  Right Coronary Artery: Large dominant vessel with 100% mid occlusion. The mid and distal vessel fills from left to right collaterals.   Left Ventricular Angiogram: LVEF=40-45%.   Impression:  1. Double vessel CAD with chronic total occlusion of RCA and Circumflex, no targets for PCI  2. Mild LV systolic dysfunction   Recommendations: Continue medical  management.     Chemistry      Component Value Date/Time   NA 130* 08/21/2012 0550   K 3.7 08/21/2012 0550   CL 97 08/21/2012 0550   CO2 27 08/21/2012 0550   BUN 13 08/21/2012 0550   CREATININE 0.63 08/21/2012 0550      Component Value Date/Time   CALCIUM 9.1 08/21/2012 0550   ALKPHOS 65 06/16/2011 1022   AST 15 06/16/2011 1022   ALT 13 06/16/2011 1022   BILITOT 0.3 06/16/2011 1022     Lab Results  Component Value Date   CHOL 166 06/16/2011   HDL 61.30 06/16/2011  LDLCALC 91 06/16/2011   TRIG 70.0 06/16/2011   CHOLHDL 3 06/16/2011   Lab Results  Component Value Date   WBC 6.2 08/21/2012   HGB 13.3 08/21/2012   HCT 39.2 08/21/2012   MCV 85.6 08/21/2012   PLT 160 08/21/2012   Assessment / Plan: 1. CAD - with recent admission for chest pain - s/p cath - to continue with medical management. His chest pain has improved.   2. HTN - BP only fairly well controlled. Not interested in more medicines at this time.   3. Tobacco abuse - not interested in stopping  4. Situational stress - this seems to be the crux of his issues. Not sure how much longer he is going to be able to provide 24 hour care.   5.HLD - off Zetia - will refill today  6. Ischemic CM - was to have echo during this last admission - will go ahead and arrange  7. SVT - now on amiodarone - has cut back to just one a day. Check EKG today and the baseline labs including TSH (not done during admission).   [name] will see him back in about 2 months with a repeat EKG.   Patient is agreeable to this plan and will call if any problems develop in the interim.   Rosalio Macadamia, RN, ANP-C [facility] [address] [place], [place]  [address]   Addendum:  EKG shows sinus bradycardia with rate of 48. QT is 528 with QTc of 471 - I have left him on his current regimen for now. His dose of amiodarone has just been cut back to 200 mg a day. Will recheck EKG on return visit. Checking other labs today as well.

## 2012-09-19 ENCOUNTER — Ambulatory Visit (HOSPITAL_COMMUNITY): Payer: Medicare Other | Attending: Nurse Practitioner

## 2012-09-19 DIAGNOSIS — R079 Chest pain, unspecified: Secondary | ICD-10-CM | POA: Insufficient documentation

## 2012-09-19 DIAGNOSIS — I498 Other specified cardiac arrhythmias: Secondary | ICD-10-CM | POA: Insufficient documentation

## 2012-09-19 DIAGNOSIS — F17200 Nicotine dependence, unspecified, uncomplicated: Secondary | ICD-10-CM | POA: Insufficient documentation

## 2012-09-19 DIAGNOSIS — I2589 Other forms of chronic ischemic heart disease: Secondary | ICD-10-CM

## 2012-09-19 DIAGNOSIS — I255 Ischemic cardiomyopathy: Secondary | ICD-10-CM

## 2012-09-19 DIAGNOSIS — I2510 Atherosclerotic heart disease of native coronary artery without angina pectoris: Secondary | ICD-10-CM

## 2012-09-19 DIAGNOSIS — I059 Rheumatic mitral valve disease, unspecified: Secondary | ICD-10-CM | POA: Insufficient documentation

## 2012-09-19 DIAGNOSIS — I379 Nonrheumatic pulmonary valve disorder, unspecified: Secondary | ICD-10-CM | POA: Insufficient documentation

## 2012-09-19 DIAGNOSIS — J449 Chronic obstructive pulmonary disease, unspecified: Secondary | ICD-10-CM | POA: Insufficient documentation

## 2012-09-19 DIAGNOSIS — I10 Essential (primary) hypertension: Secondary | ICD-10-CM | POA: Insufficient documentation

## 2012-09-19 DIAGNOSIS — J4489 Other specified chronic obstructive pulmonary disease: Secondary | ICD-10-CM | POA: Insufficient documentation

## 2012-09-19 NOTE — Progress Notes (Signed)
Echocardiogram performed.  

## 2012-10-06 ENCOUNTER — Other Ambulatory Visit: Payer: Self-pay | Admitting: Cardiology

## 2012-11-23 ENCOUNTER — Ambulatory Visit (INDEPENDENT_AMBULATORY_CARE_PROVIDER_SITE_OTHER): Payer: Medicare Other | Admitting: Cardiology

## 2012-11-23 ENCOUNTER — Encounter: Payer: Self-pay | Admitting: Cardiology

## 2012-11-23 VITALS — BP 130/70 | HR 60 | Ht 75.0 in | Wt 190.4 lb

## 2012-11-23 DIAGNOSIS — Z720 Tobacco use: Secondary | ICD-10-CM

## 2012-11-23 DIAGNOSIS — I471 Supraventricular tachycardia: Secondary | ICD-10-CM

## 2012-11-23 DIAGNOSIS — I10 Essential (primary) hypertension: Secondary | ICD-10-CM

## 2012-11-23 DIAGNOSIS — F17200 Nicotine dependence, unspecified, uncomplicated: Secondary | ICD-10-CM

## 2012-11-23 DIAGNOSIS — I2589 Other forms of chronic ischemic heart disease: Secondary | ICD-10-CM

## 2012-11-23 DIAGNOSIS — I480 Paroxysmal atrial fibrillation: Secondary | ICD-10-CM

## 2012-11-23 DIAGNOSIS — I2510 Atherosclerotic heart disease of native coronary artery without angina pectoris: Secondary | ICD-10-CM

## 2012-11-23 DIAGNOSIS — I255 Ischemic cardiomyopathy: Secondary | ICD-10-CM

## 2012-11-23 DIAGNOSIS — I4891 Unspecified atrial fibrillation: Secondary | ICD-10-CM

## 2012-11-23 DIAGNOSIS — I498 Other specified cardiac arrhythmias: Secondary | ICD-10-CM

## 2012-11-23 MED ORDER — POLYETHYLENE GLYCOL 3350 17 GM/SCOOP PO POWD: 17.0000 g | Freq: Three times a day (TID) | ORAL | Status: DC

## 2012-11-23 NOTE — Progress Notes (Signed)
[name] Date of Birth: [birth year] Medical Record #[id number]  History of Present Illness: [name] is seen back today for a followup visit. He has known CAD with prior DES to LCX in 2006, repeat cath in 2011 and in June of 2014 he has chronic occlusion of the mid RCA and of the distal circumflex - managed medically, ischemic CM with EF 30 to 35%, ongoing tobacco abuse, SVT - on amiodarone, COPD, history of lung cancer and HTN.   He is the primary caregiver for his wife with advanced Alzheimer's.   On followup today he reports that he is doing well from a heart standpoint. He has no significant chest pain. His wife has been ill with cancer and apparently has liver metastases. He has a great deal of stress caring for her. His major complaint today is that his legs give out easily. He denies any palpitations or increase shortness of breath.  Current Outpatient Prescriptions  Medication Sig Dispense Refill  . acetaminophen (TYLENOL) 500 MG tablet Take 250 mg by mouth daily as needed for pain.      Marland Kitchen albuterol (PROVENTIL HFA;VENTOLIN HFA) 108 (90 BASE) MCG/ACT inhaler Inhale 2 puffs into the lungs 3 (three) times daily as needed for wheezing or shortness of breath.      Marland Kitchen amiodarone (PACERONE) 200 MG tablet Take 1 tablet (200mg ) total by mouth 2 (two) times daily until 09/04/12, then reduce to 1 tablet (200mg  total) by mouth once daily.  42 tablet  3  . amLODipine (NORVASC) 10 MG tablet Take 1 tablet (10 mg total) by mouth daily.  30 tablet  3  . aspirin EC 81 MG tablet Take 81 mg by mouth as needed (chest pain).      Marland Kitchen clopidogrel (PLAVIX) 75 MG tablet Take 75 mg by mouth daily.      Marland Kitchen ezetimibe (ZETIA) 10 MG tablet Take 1 tablet (10 mg total) by mouth as needed (when he thinks cholesterol is going up).  30 tablet  6  . fexofenadine (ALLEGRA) 180 MG tablet Take 180 mg by mouth daily as needed (allergies).       . isosorbide mononitrate (IMDUR) 60 MG 24 hr tablet TAKE  (1)  TABLET TWICE A DAY.   60 tablet  6  . losartan (COZAAR) 100 MG tablet Take 1 tablet (100 mg total) by mouth daily.  30 tablet  3  . nitroGLYCERIN (NITROSTAT) 0.4 MG SL tablet Place 0.4 mg under the tongue every 5 (five) minutes as needed for chest pain.      . pantoprazole (PROTONIX) 40 MG tablet Take 1 tablet (40 mg total) by mouth daily.  30 tablet  3  . PRESCRIPTION MEDICATION Place 2 drops into both eyes 2 (two) times daily as needed (irritated eyes). Eye drop samples from doctor's office      . polyethylene glycol powder (MIRALAX) powder Take 17 g by mouth 3 (three) times daily.  527 g  6   No current facility-administered medications for this visit.    Allergies  Allergen Reactions  . Doxycycline     arthralgias  . Sulfa Drugs Cross Reactors Rash    Past Medical History  Diagnosis Date  . CAD (coronary artery disease)     a. s/p DES-LCx 2006 b. repeat cath 2011- 90% distal LCx ISR, 100% mid RCA CTO, diffuse nonobstructive dz, medical management c. 07/2012 cath- 100% distal LCx ISR, 100% mid RCA CTO, nonobstructive dz; EF 40-45%  . PVC (premature ventricular contraction)   .  HTN (hypertension)   . Lung cancer     non small cell  . Paroxysmal a-fib   . PAD (peripheral artery disease)   . COPD (chronic obstructive pulmonary disease)   . LV dysfunction   . Anemia, iron deficiency     Past Surgical History  Procedure Laterality Date  . Lobectomy      left upper  . Hernia repair      x5  . Appendectomy    . Back surgery    . Cervical spine surgery    . Twenty four hour intraesophageal ph profile.  05/02/2001  . Aortogram with bilateral lower extremity runoff.  07/11/2003  . Fiberoptic bronchoscopy, mediastinum.  10/20/2005  . Left video-assisted thoracoscopic surgery, left  12/01/2005  . Esophagogastroduodenoscopy with biopsy.  04/14/2010    History  Smoking status  . Current Some Day Smoker -- 0.50 packs/day  Smokeless tobacco  . Not on file    History  Alcohol Use No    History  reviewed. No pertinent family history.  Review of Systems: The review of systems is per the HPI.  All other systems were reviewed and are negative.  Physical Exam: BP 130/70  Pulse 60  Ht 6\' 3"  (1.905 m)  Wt 190 lb 6.4 oz (86.365 kg)  BMI 23.8 kg/m2  SpO2 98% Patient is very pleasant and in no acute distress. Skin is warm and dry. Color is normal.  HEENT is unremarkable. Normocephalic/atraumatic. PERRL. Sclera are nonicteric. Neck is supple. No masses. No JVD. Lungs are coarse. Cardiac exam shows a regular rate and rhythm. Abdomen is soft. Extremities are without edema. Gait and ROM are intact. No gross neurologic deficits noted.   LABORATORY DATA:      Chemistry      Component Value Date/Time   NA 130* 08/21/2012 0550   K 3.7 08/21/2012 0550   CL 97 08/21/2012 0550   CO2 27 08/21/2012 0550   BUN 13 08/21/2012 0550   CREATININE 0.63 08/21/2012 0550      Component Value Date/Time   CALCIUM 9.1 08/21/2012 0550   ALKPHOS 65 06/16/2011 1022   AST 15 06/16/2011 1022   ALT 13 06/16/2011 1022   BILITOT 0.3 06/16/2011 1022     Lab Results  Component Value Date   CHOL 166 06/16/2011   HDL 61.30 06/16/2011   LDLCALC 91 06/16/2011   TRIG 70.0 06/16/2011   CHOLHDL 3 06/16/2011   Lab Results  Component Value Date   WBC 6.2 08/21/2012   HGB 13.3 08/21/2012   HCT 39.2 08/21/2012   MCV 85.6 08/21/2012   PLT 160 08/21/2012   Assessment / Plan: 1. CAD - chronic total occlusion of the mid RCA and distal circumflex. We'll continue medical management.  2. HTN - BP control is satisfactory.   3. Tobacco abuse - not interested in stopping-capsule of the importance of smoking cessation.  4. Situational stress   5.HLD - on Zetia  6. Ischemic CM - EF by echo of 40-45% consistent with his cath findings. No evidence of active CHF.  7. SVT - now on amiodarone 200 mg daily. I'll followup again in 3 months and we will check labs at that time including chemistries and TSH.

## 2012-11-23 NOTE — Patient Instructions (Addendum)
Continue your current therapy  I will see you in 3 months with fasting lab work 

## 2012-12-01 ENCOUNTER — Other Ambulatory Visit: Payer: Self-pay | Admitting: *Deleted

## 2012-12-01 NOTE — Progress Notes (Signed)
Per Dr Donnald Garre, pt needs lab/ CT scan and f/u within the next few weeks.  Onc tx schedule filled out.  SLJ

## 2012-12-05 ENCOUNTER — Telehealth: Payer: Self-pay | Admitting: Internal Medicine

## 2012-12-05 NOTE — Telephone Encounter (Signed)
lmonvm for pt re appts for 11/3 lb @ 12:45pm and f/u 11/6 @ 9:45am. Lb appt time was ordinally scheduled for 11am and then changed to 12:45pm per call from central to coord w/ct time. Pt schedule mailed today reflected appts listed and a second message was left for pt reflecting lab time noted above.

## 2012-12-06 ENCOUNTER — Telehealth: Payer: Self-pay | Admitting: Internal Medicine

## 2012-12-06 NOTE — Telephone Encounter (Signed)
Pt called to move 11/3 lb/ct to 11/4 due to he is trying to coord care for both him and his wife and his wife has appts @ [facility] 11/4. Moved appts - s/w Tiffany in central. gv pt new d/t for lb/ct 11/4 @ 2:45pm for lb and 3:30pm for ct.

## 2012-12-09 ENCOUNTER — Telehealth: Payer: Self-pay | Admitting: Internal Medicine

## 2012-12-09 NOTE — Telephone Encounter (Signed)
Pt called today to r/s 11/6 f/u to 11/11 @ 2pm to coord w/wife's appt.

## 2012-12-15 ENCOUNTER — Telehealth: Payer: Self-pay | Admitting: Internal Medicine

## 2012-12-15 NOTE — Telephone Encounter (Signed)
Pt called and moved his 11/4 lb to 10/28 due to a change in the time of his wife's 11/4 appts. Pt is trying to keep both he and his wife's appts as closely coord together as possible. Pt has new d/t for 10/28 @ 3pm. Both he and his wife will have lb draw this d/t.

## 2012-12-20 ENCOUNTER — Other Ambulatory Visit (HOSPITAL_BASED_OUTPATIENT_CLINIC_OR_DEPARTMENT_OTHER): Payer: Medicare Other | Admitting: Lab

## 2012-12-20 DIAGNOSIS — C3490 Malignant neoplasm of unspecified part of unspecified bronchus or lung: Secondary | ICD-10-CM

## 2012-12-20 LAB — CBC WITH DIFFERENTIAL/PLATELET
BASO%: 1.3 % (ref 0.0–2.0)
Basophils Absolute: 0.1 10*3/uL (ref 0.0–0.1)
EOS%: 6.1 % (ref 0.0–7.0)
Eosinophils Absolute: 0.4 10*3/uL (ref 0.0–0.5)
HCT: 38.7 % (ref 38.4–49.9)
HGB: 12.5 g/dL — ABNORMAL LOW (ref 13.0–17.1)
LYMPH%: 22.5 % (ref 14.0–49.0)
MCH: 28.2 pg (ref 27.2–33.4)
MCHC: 32.4 g/dL (ref 32.0–36.0)
NEUT%: 59.7 % (ref 39.0–75.0)
RBC: 4.45 10*6/uL (ref 4.20–5.82)
RDW: 15.3 % — ABNORMAL HIGH (ref 11.0–14.6)
WBC: 7 10*3/uL (ref 4.0–10.3)
lymph#: 1.6 10*3/uL (ref 0.9–3.3)

## 2012-12-20 LAB — COMPREHENSIVE METABOLIC PANEL (CC13)
ALT: 10 U/L (ref 0–55)
AST: 13 U/L (ref 5–34)
Chloride: 102 mEq/L (ref 98–109)
Creatinine: 1.1 mg/dL (ref 0.7–1.3)
Glucose: 98 mg/dl (ref 70–140)
Sodium: 136 mEq/L (ref 136–145)
Total Bilirubin: 0.38 mg/dL (ref 0.20–1.20)
Total Protein: 7 g/dL (ref 6.4–8.3)

## 2012-12-26 ENCOUNTER — Other Ambulatory Visit (HOSPITAL_COMMUNITY): Payer: Medicare Other

## 2012-12-26 ENCOUNTER — Other Ambulatory Visit: Payer: Self-pay | Admitting: Cardiology

## 2012-12-26 ENCOUNTER — Other Ambulatory Visit (HOSPITAL_COMMUNITY): Payer: Self-pay | Admitting: Physician Assistant

## 2012-12-26 ENCOUNTER — Other Ambulatory Visit: Payer: Medicare Other | Admitting: Lab

## 2012-12-27 ENCOUNTER — Ambulatory Visit (HOSPITAL_COMMUNITY)
Admission: RE | Admit: 2012-12-27 | Discharge: 2012-12-27 | Disposition: A | Discharge status: Home / Self Care | Payer: Medicare Other | Source: Ambulatory Visit | Attending: Internal Medicine | Admitting: Internal Medicine

## 2012-12-27 ENCOUNTER — Other Ambulatory Visit: Payer: Self-pay | Admitting: Internal Medicine

## 2012-12-27 ENCOUNTER — Other Ambulatory Visit: Payer: Medicare Other | Admitting: Lab

## 2012-12-27 DIAGNOSIS — J90 Pleural effusion, not elsewhere classified: Secondary | ICD-10-CM | POA: Insufficient documentation

## 2012-12-27 DIAGNOSIS — C3490 Malignant neoplasm of unspecified part of unspecified bronchus or lung: Secondary | ICD-10-CM | POA: Insufficient documentation

## 2012-12-27 DIAGNOSIS — I2510 Atherosclerotic heart disease of native coronary artery without angina pectoris: Secondary | ICD-10-CM | POA: Insufficient documentation

## 2012-12-27 DIAGNOSIS — J4489 Other specified chronic obstructive pulmonary disease: Secondary | ICD-10-CM | POA: Insufficient documentation

## 2012-12-27 DIAGNOSIS — N2889 Other specified disorders of kidney and ureter: Secondary | ICD-10-CM | POA: Insufficient documentation

## 2012-12-27 DIAGNOSIS — J449 Chronic obstructive pulmonary disease, unspecified: Secondary | ICD-10-CM | POA: Insufficient documentation

## 2012-12-29 ENCOUNTER — Ambulatory Visit: Payer: Medicare Other | Admitting: Internal Medicine

## 2013-01-03 ENCOUNTER — Other Ambulatory Visit: Payer: Self-pay | Admitting: *Deleted

## 2013-01-03 ENCOUNTER — Ambulatory Visit: Payer: Medicare Other | Admitting: Internal Medicine

## 2013-01-03 NOTE — Progress Notes (Signed)
[facility] for f/u for f/u appt, onc tx schedule filled out.  SLJ

## 2013-01-05 ENCOUNTER — Telehealth: Payer: Self-pay | Admitting: Internal Medicine

## 2013-01-05 NOTE — Telephone Encounter (Signed)
, °

## 2013-01-06 ENCOUNTER — Telehealth: Payer: Self-pay | Admitting: Internal Medicine

## 2013-01-06 NOTE — Telephone Encounter (Signed)
pt called to r/s appt to same day wife has appt....maile pt letter and appt sched

## 2013-01-16 ENCOUNTER — Ambulatory Visit: Payer: Medicare Other | Admitting: Internal Medicine

## 2013-01-16 ENCOUNTER — Other Ambulatory Visit (HOSPITAL_COMMUNITY): Payer: Self-pay | Admitting: Physician Assistant

## 2013-01-17 ENCOUNTER — Encounter: Payer: Self-pay | Admitting: Internal Medicine

## 2013-01-17 ENCOUNTER — Ambulatory Visit (HOSPITAL_BASED_OUTPATIENT_CLINIC_OR_DEPARTMENT_OTHER): Payer: Medicare Other | Admitting: Internal Medicine

## 2013-01-17 DIAGNOSIS — Z85118 Personal history of other malignant neoplasm of bronchus and lung: Secondary | ICD-10-CM

## 2013-01-17 NOTE — Patient Instructions (Signed)
Followup visit in one year with repeat CT scan of the chest. 

## 2013-01-17 NOTE — Progress Notes (Signed)
[facility] Telephone:([phone]   Fax:([phone]) [phone]  OFFICE PROGRESS NOTE  Eartha Inch, MD [phone] [place] [address]  DIAGNOSIS: Stage IIB (T3, N0, MX) non-small cell lung cancer diagnosed in August of 2007.  PRIOR THERAPY:  1) Status post left upper lobectomy with lymph node dissection under the care of Dr. Edwyna Shell and the final pathology revealed 9.0 CM tumor consistent with squamous cell carcinoma. 2) Status post adjuvant chemotherapy with carboplatin for AUC of 6 and paclitaxel 200 mg/M2 every 3 weeks for a total of 4 cycles.  CURRENT THERAPY: Observation.  INTERVAL HISTORY: [name] 79 y.o. male returns to the clinic today for followup visit. The patient is feeling fine today with no specific complaints. He denied having any significant chest pain, shortness of breath, cough or hemoptysis. The patient denied having any fever or chills. He had repeat CT scan of the chest performed recently and he is here for evaluation and discussion of his scan results.  MEDICAL HISTORY: Past Medical History  Diagnosis Date  . CAD (coronary artery disease)     a. s/p DES-LCx 2006 b. repeat cath 2011- 90% distal LCx ISR, 100% mid RCA CTO, diffuse nonobstructive dz, medical management c. 07/2012 cath- 100% distal LCx ISR, 100% mid RCA CTO, nonobstructive dz; EF 40-45%  . PVC (premature ventricular contraction)   . HTN (hypertension)   . Lung cancer     non small cell  . Paroxysmal a-fib   . PAD (peripheral artery disease)   . COPD (chronic obstructive pulmonary disease)   . LV dysfunction   . Anemia, iron deficiency     ALLERGIES:  is allergic to doxycycline and sulfa drugs cross reactors.  MEDICATIONS:  Current Outpatient Prescriptions  Medication Sig Dispense Refill  . acetaminophen (TYLENOL) 500 MG tablet Take 250 mg by mouth daily as needed for pain.      Marland Kitchen albuterol (PROVENTIL HFA;VENTOLIN HFA) 108 (90 BASE) MCG/ACT inhaler Inhale 2  puffs into the lungs 3 (three) times daily as needed for wheezing or shortness of breath.      Marland Kitchen amiodarone (PACERONE) 200 MG tablet Take 1 tablet (200 mg total) by mouth daily.  30 tablet  1  . amLODipine (NORVASC) 10 MG tablet TAKE 1 TABLET ONCE A DAY  30 tablet  4  . aspirin EC 81 MG tablet Take 81 mg by mouth as needed (chest pain).      Marland Kitchen clopidogrel (PLAVIX) 75 MG tablet TAKE 1 TABLET DAILY  30 tablet  2  . ezetimibe (ZETIA) 10 MG tablet Take 1 tablet (10 mg total) by mouth as needed (when he thinks cholesterol is going up).  30 tablet  6  . fexofenadine (ALLEGRA) 180 MG tablet Take 180 mg by mouth daily as needed (allergies).       . isosorbide mononitrate (IMDUR) 60 MG 24 hr tablet TAKE  (1)  TABLET TWICE A DAY.  60 tablet  6  . losartan (COZAAR) 100 MG tablet Take 1 tablet (100 mg total) by mouth daily.  30 tablet  3  . nitroGLYCERIN (NITROSTAT) 0.4 MG SL tablet Place 0.4 mg under the tongue every 5 (five) minutes as needed for chest pain.      . pantoprazole (PROTONIX) 40 MG tablet Take 1 tablet (40 mg total) by mouth daily.  30 tablet  3  . polyethylene glycol powder (MIRALAX) powder Take 17 g by mouth 3 (three) times daily.  527 g  6  . PRESCRIPTION MEDICATION Place 2 drops into both eyes 2 (two) times daily as needed (irritated eyes). Eye drop samples from doctor's office       No current facility-administered medications for this visit.    SURGICAL HISTORY:  Past Surgical History  Procedure Laterality Date  . Lobectomy      left upper  . Hernia repair      x5  . Appendectomy    . Back surgery    . Cervical spine surgery    . Twenty four hour intraesophageal ph profile.  05/02/2001  . Aortogram with bilateral lower extremity runoff.  07/11/2003  . Fiberoptic bronchoscopy, mediastinum.  10/20/2005  . Left video-assisted thoracoscopic surgery, left  12/01/2005  . Esophagogastroduodenoscopy with biopsy.  04/14/2010    REVIEW OF SYSTEMS:  A comprehensive review of systems  was negative.   PHYSICAL EXAMINATION: General appearance: alert, cooperative and no distress Head: Normocephalic, without obvious abnormality, atraumatic Neck: no adenopathy, no JVD, supple, symmetrical, trachea midline and thyroid not enlarged, symmetric, no tenderness/mass/nodules Lymph nodes: Cervical, supraclavicular, and axillary nodes normal. Resp: clear to auscultation bilaterally Back: symmetric, no curvature. ROM normal. No CVA tenderness. Cardio: regular rate and rhythm, S1, S2 normal, no murmur, click, rub or gallop GI: soft, non-tender; bowel sounds normal; no masses,  no organomegaly Extremities: extremities normal, atraumatic, no cyanosis or edema  ECOG PERFORMANCE STATUS: 0 - Asymptomatic  There were no vitals taken for this visit.  LABORATORY DATA: Lab Results  Component Value Date   WBC 7.0 12/20/2012   HGB 12.5* 12/20/2012   HCT 38.7 12/20/2012   MCV 87.1 12/20/2012   PLT 232 12/20/2012      Chemistry      Component Value Date/Time   NA 136 12/20/2012 1520   NA 130* 08/21/2012 0550   K 4.4 12/20/2012 1520   K 3.7 08/21/2012 0550   CL 97 08/21/2012 0550   CO2 26 12/20/2012 1520   CO2 27 08/21/2012 0550   BUN 10.1 12/20/2012 1520   BUN 13 08/21/2012 0550   CREATININE 1.1 12/20/2012 1520   CREATININE 0.63 08/21/2012 0550      Component Value Date/Time   CALCIUM 9.9 12/20/2012 1520   CALCIUM 9.1 08/21/2012 0550   ALKPHOS 67 12/20/2012 1520   ALKPHOS 65 06/16/2011 1022   AST 13 12/20/2012 1520   AST 15 06/16/2011 1022   ALT 10 12/20/2012 1520   ALT 13 06/16/2011 1022   BILITOT 0.38 12/20/2012 1520   BILITOT 0.3 06/16/2011 1022       RADIOGRAPHIC STUDIES: Ct Chest Wo Contrast  12/27/2012   CLINICAL DATA:  History of lung malignancy. The patient has undergone previous left lobectomy. The patient has a history of coronary artery disease and COPD  EXAM: CT CHEST WITHOUT CONTRAST  TECHNIQUE: Multidetector CT imaging of the chest was performed following the  standard protocol without IV contrast.  COMPARISON:  December 16, 2010.  FINDINGS: At lung window settings there are postsurgical changes in the left upper hemithorax. There is somewhat more pleural thickening noted posteriorly in the left pulmonary apex. No discrete mass is demonstrated. There are stable large bullous lesions at the left lung base. The right lung demonstrates compensatory hyperinflation. No parenchymal masses are demonstrated in the right lung. There is a stable approximately 6 mm diameter pleural based density on image 58 of series 5 in the right lower lobe.  And mediastinal window settings there is mild shift from right to  left. The cardiac chambers are normal in size. There are coronary artery calcifications. The caliber of the thoracic aorta is normal. No bulky mediastinal or hilar or axillary lymph nodes are demonstrated. There is a stable small left pleural effusion. Which is likely loculated and lies in the posterior costophrenic gutter.  Within the upper abdomen the observed portions of the liver and spleen appear normal. There are no adrenal masses. There are calcifications in both kidneys and are likely vascular. No obstruction is demonstrated. The thoracic vertebral bodies are preserved in height. There is calcification of the anterior longitudinal ligament.  IMPRESSION: 1. There are stable postsurgical changes in the left hemi thorax. There are no findings suggestive of recurrent malignancy. 2. Compensatory hyperinflation of the right lung is present. No right lung parenchymal lesions are demonstrated. 3. There is no mediastinal or hilar lymphadenopathy.   Electronically Signed   By: David  [place]   On: 12/27/2012 15:58    ASSESSMENT AND PLAN: This is a very pleasant 79 years old white male with history of stage IIB non-small cell lung cancer, squamous cell carcinoma status post left upper lobectomy with lymph node dissection followed by 4 cycles of adjuvant chemotherapy. The patient  is doing fine and he has no evidence for disease recurrence on the recent scan. I discussed the scan results with the patient. I recommended for him to continue on observation with repeat CT scan of the chest in one year. He was advised to call immediately if he has any concerning symptoms in the interval. The patient voices understanding of current disease status and treatment options and is in agreement with the current care plan.  All questions were answered. The patient knows to call the clinic with any problems, questions or concerns. We can certainly see the patient much sooner if necessary.

## 2013-01-18 ENCOUNTER — Telehealth: Payer: Self-pay | Admitting: Internal Medicine

## 2013-01-18 NOTE — Telephone Encounter (Signed)
lvm for pt regarding to NOV 2015 appts....mailed pt avs and letter

## 2013-03-28 ENCOUNTER — Other Ambulatory Visit: Payer: Self-pay | Admitting: Cardiology

## 2013-04-25 ENCOUNTER — Other Ambulatory Visit: Payer: Self-pay | Admitting: Cardiology

## 2013-05-25 ENCOUNTER — Other Ambulatory Visit: Payer: Self-pay | Admitting: Cardiology

## 2013-05-25 ENCOUNTER — Other Ambulatory Visit (HOSPITAL_COMMUNITY): Payer: Self-pay | Admitting: Physician Assistant

## 2013-05-25 ENCOUNTER — Other Ambulatory Visit (HOSPITAL_COMMUNITY): Payer: Self-pay | Admitting: Internal Medicine

## 2013-06-13 ENCOUNTER — Encounter (HOSPITAL_COMMUNITY): Payer: Self-pay | Admitting: Emergency Medicine

## 2013-06-13 ENCOUNTER — Emergency Department (HOSPITAL_COMMUNITY)
Admission: EM | Admit: 2013-06-13 | Discharge: 2013-06-13 | Disposition: A | Discharge status: Home / Self Care | Payer: Medicare Other | Attending: Emergency Medicine | Admitting: Emergency Medicine

## 2013-06-13 ENCOUNTER — Emergency Department (HOSPITAL_COMMUNITY): Payer: Medicare Other

## 2013-06-13 DIAGNOSIS — Z862 Personal history of diseases of the blood and blood-forming organs and certain disorders involving the immune mechanism: Secondary | ICD-10-CM | POA: Insufficient documentation

## 2013-06-13 DIAGNOSIS — F17200 Nicotine dependence, unspecified, uncomplicated: Secondary | ICD-10-CM | POA: Insufficient documentation

## 2013-06-13 DIAGNOSIS — I2510 Atherosclerotic heart disease of native coronary artery without angina pectoris: Secondary | ICD-10-CM | POA: Insufficient documentation

## 2013-06-13 DIAGNOSIS — Z7982 Long term (current) use of aspirin: Secondary | ICD-10-CM | POA: Insufficient documentation

## 2013-06-13 DIAGNOSIS — J449 Chronic obstructive pulmonary disease, unspecified: Secondary | ICD-10-CM | POA: Insufficient documentation

## 2013-06-13 DIAGNOSIS — J4489 Other specified chronic obstructive pulmonary disease: Secondary | ICD-10-CM | POA: Insufficient documentation

## 2013-06-13 DIAGNOSIS — Z79899 Other long term (current) drug therapy: Secondary | ICD-10-CM | POA: Insufficient documentation

## 2013-06-13 DIAGNOSIS — K219 Gastro-esophageal reflux disease without esophagitis: Secondary | ICD-10-CM | POA: Insufficient documentation

## 2013-06-13 DIAGNOSIS — I10 Essential (primary) hypertension: Secondary | ICD-10-CM | POA: Insufficient documentation

## 2013-06-13 DIAGNOSIS — R143 Flatulence: Secondary | ICD-10-CM

## 2013-06-13 DIAGNOSIS — R142 Eructation: Secondary | ICD-10-CM

## 2013-06-13 DIAGNOSIS — Z85118 Personal history of other malignant neoplasm of bronchus and lung: Secondary | ICD-10-CM | POA: Insufficient documentation

## 2013-06-13 DIAGNOSIS — R141 Gas pain: Secondary | ICD-10-CM | POA: Insufficient documentation

## 2013-06-13 DIAGNOSIS — Z8711 Personal history of peptic ulcer disease: Secondary | ICD-10-CM | POA: Insufficient documentation

## 2013-06-13 DIAGNOSIS — Z7902 Long term (current) use of antithrombotics/antiplatelets: Secondary | ICD-10-CM | POA: Insufficient documentation

## 2013-06-13 LAB — CBC
HEMATOCRIT: 38.3 % — AB (ref 39.0–52.0)
HEMOGLOBIN: 12.6 g/dL — AB (ref 13.0–17.0)
MCH: 26.2 pg (ref 26.0–34.0)
MCHC: 32.9 g/dL (ref 30.0–36.0)
MCV: 79.6 fL (ref 78.0–100.0)
Platelets: 220 10*3/uL (ref 150–400)
RBC: 4.81 MIL/uL (ref 4.22–5.81)
RDW: 16.4 % — ABNORMAL HIGH (ref 11.5–15.5)
WBC: 9.9 10*3/uL (ref 4.0–10.5)

## 2013-06-13 LAB — BASIC METABOLIC PANEL
BUN: 15 mg/dL (ref 6–23)
CHLORIDE: 94 meq/L — AB (ref 96–112)
CO2: 26 meq/L (ref 19–32)
Calcium: 10 mg/dL (ref 8.4–10.5)
Creatinine, Ser: 0.82 mg/dL (ref 0.50–1.35)
GFR calc Af Amer: >90 mL/min (ref >90)
GFR calc non Af Amer: 83 mL/min — ABNORMAL LOW (ref >90)
Glucose, Bld: 106 mg/dL — ABNORMAL HIGH (ref 70–99)
Potassium: 3.8 mEq/L (ref 3.7–5.3)
SODIUM: 132 meq/L — AB (ref 137–147)

## 2013-06-13 LAB — HEPATIC FUNCTION PANEL
ALBUMIN: 3.8 g/dL (ref 3.5–5.2)
ALK PHOS: 66 U/L (ref 39–117)
ALT: 14 U/L (ref 0–53)
AST: 24 U/L (ref 0–37)
Bilirubin, Direct: <0.2 mg/dL (ref 0.0–0.3)
TOTAL PROTEIN: 7.3 g/dL (ref 6.0–8.3)
Total Bilirubin: 0.2 mg/dL — ABNORMAL LOW (ref 0.3–1.2)

## 2013-06-13 LAB — LIPASE, BLOOD: LIPASE: 41 U/L (ref 11–59)

## 2013-06-13 LAB — I-STAT TROPONIN, ED: Troponin i, poc: 0.02 ng/mL (ref 0.00–0.08)

## 2013-06-13 MED ORDER — GI COCKTAIL ~~LOC~~
30.0000 mL | Freq: Once | ORAL | Status: AC
  Administered 2013-06-13: 30 mL via ORAL
  Filled 2013-06-13: qty 30

## 2013-06-13 MED ORDER — MORPHINE SULFATE 4 MG/ML IJ SOLN
6.0000 mg | Freq: Once | INTRAMUSCULAR | Status: AC
  Administered 2013-06-13: 6 mg via INTRAVENOUS
  Filled 2013-06-13: qty 2

## 2013-06-13 MED ORDER — PANTOPRAZOLE SODIUM 40 MG PO TBEC: 40.0000 mg | DELAYED_RELEASE_TABLET | Freq: Two times a day (BID) | ORAL | Status: DC

## 2013-06-13 MED ORDER — IOHEXOL 300 MG/ML  SOLN: 50.0000 mL | Freq: Once | INTRAMUSCULAR | Status: DC | PRN

## 2013-06-13 MED ORDER — IOHEXOL 300 MG/ML  SOLN
100.0000 mL | Freq: Once | INTRAMUSCULAR | Status: AC | PRN
  Administered 2013-06-13: 100 mL via INTRAVENOUS

## 2013-06-13 MED ORDER — SODIUM CHLORIDE 0.9 % IV BOLUS (SEPSIS)
1000.0000 mL | Freq: Once | INTRAVENOUS | Status: AC
  Administered 2013-06-13: 1000 mL via INTRAVENOUS

## 2013-06-13 MED ORDER — GI COCKTAIL ~~LOC~~: 30.0000 mL | Freq: Once | ORAL | Status: DC

## 2013-06-13 NOTE — ED Notes (Signed)
Pt states he has heart burn x 2 days. Denies n/v/d.

## 2013-06-13 NOTE — ED Provider Notes (Signed)
CSN: [id number]     Arrival date & time 06/13/13  1413 History   First MD Initiated Contact with Patient 06/13/13 1507     Chief Complaint  Patient presents with  . Chest Pain      HPI Patient reports a burning sensation in his chest has been constant in the past 2 days.  He describes as heartburn.  No shortness of breath.  No radiation of his symptoms.  He try TUMS without improvement in his symptoms.  He has had a history of gastroesophageal reflux disease.  He previously had gastric ulcers in the past and had endoscopy before in the past but states he's been doing much better.  He continues to take Protonix daily.  His had no cough or congestion.  Fevers or chills.  He's had no nausea vomiting or diarrhea.  He does report mild upper abdominal discomfort and abdominal swelling   Past Medical History  Diagnosis Date  . CAD (coronary artery disease)     a. s/p DES-LCx 2006 b. repeat cath 2011- 90% distal LCx ISR, 100% mid RCA CTO, diffuse nonobstructive dz, medical management c. 07/2012 cath- 100% distal LCx ISR, 100% mid RCA CTO, nonobstructive dz; EF 40-45%  . PVC (premature ventricular contraction)   . HTN (hypertension)   . Lung cancer     non small cell  . Paroxysmal a-fib   . PAD (peripheral artery disease)   . COPD (chronic obstructive pulmonary disease)   . LV dysfunction   . Anemia, iron deficiency    Past Surgical History  Procedure Laterality Date  . Lobectomy      left upper  . Hernia repair      x5  . Appendectomy    . Back surgery    . Cervical spine surgery    . Twenty four hour intraesophageal ph profile.  05/02/2001  . Aortogram with bilateral lower extremity runoff.  07/11/2003  . Fiberoptic bronchoscopy, mediastinum.  10/20/2005  . Left video-assisted thoracoscopic surgery, left  12/01/2005  . Esophagogastroduodenoscopy with biopsy.  04/14/2010   No family history on file. History  Substance Use Topics  . Smoking status: Current Some Day Smoker -- 0.50  packs/day  . Smokeless tobacco: Not on file  . Alcohol Use: No    Review of Systems  All other systems reviewed and are negative.     Allergies  Doxycycline and Sulfa drugs cross reactors  Home Medications   Prior to Admission medications   Medication Sig Start Date End Date Taking? Authorizing Provider  acetaminophen (TYLENOL) 500 MG tablet Take 250 mg by mouth daily as needed for pain.   Yes Historical Provider, MD  albuterol (PROVENTIL HFA;VENTOLIN HFA) 108 (90 BASE) MCG/ACT inhaler Inhale 2 puffs into the lungs 3 (three) times daily as needed for wheezing or shortness of breath.   Yes Historical Provider, MD  amiodarone (PACERONE) 200 MG tablet Take 200 mg by mouth daily.   Yes Historical Provider, MD  amLODipine (NORVASC) 10 MG tablet Take 10 mg by mouth daily.   Yes Historical Provider, MD  aspirin EC 81 MG tablet Take 81 mg by mouth as needed (chest pain).   Yes Historical Provider, MD  clopidogrel (PLAVIX) 75 MG tablet Take 75 mg by mouth daily.   Yes Historical Provider, MD  ezetimibe (ZETIA) 10 MG tablet Take 10 mg by mouth daily.   Yes Historical Provider, MD  fexofenadine (ALLEGRA) 180 MG tablet Take 180 mg by mouth daily as needed (allergies).  Yes Historical Provider, MD  isosorbide mononitrate (IMDUR) 60 MG 24 hr tablet Take 60 mg by mouth 2 (two) times daily.   Yes Historical Provider, MD  losartan (COZAAR) 100 MG tablet Take 100 mg by mouth daily.   Yes Historical Provider, MD  nitroGLYCERIN (NITROSTAT) 0.4 MG SL tablet Place 0.4 mg under the tongue every 5 (five) minutes as needed for chest pain.   Yes Historical Provider, MD  pantoprazole (PROTONIX) 40 MG tablet Take 40 mg by mouth daily.   Yes Historical Provider, MD  polyethylene glycol (MIRALAX / GLYCOLAX) packet Take 17 g by mouth 3 (three) times daily.   Yes Historical Provider, MD  PRESCRIPTION MEDICATION Place 2 drops into both eyes 2 (two) times daily as needed (irritated eyes). Eye drop samples from  doctor's office   Yes Historical Provider, MD   BP 151/76  Pulse 67  Temp(Src) 97.8 F (36.6 C) (Oral)  Resp 16  SpO2 97% Physical Exam  Nursing note and vitals reviewed. Constitutional: He is oriented to person, place, and time. He appears well-developed and well-nourished.  HENT:  Head: Normocephalic and atraumatic.  Eyes: EOM are normal.  Neck: Normal range of motion.  Cardiovascular: Normal rate, regular rhythm, normal heart sounds and intact distal pulses.   Pulmonary/Chest: Effort normal and breath sounds normal. No respiratory distress.  Abdominal: Soft. He exhibits no distension.  Mild abdominal distention  Musculoskeletal: Normal range of motion.  Neurological: He is alert and oriented to person, place, and time.  Skin: Skin is warm and dry.  Psychiatric: He has a normal mood and affect. Judgment normal.    ED Course  Procedures (including critical care time) Labs Review Labs Reviewed  BASIC METABOLIC PANEL - Abnormal; Notable for the following:    Sodium 132 (*)    Chloride 94 (*)    Glucose, Bld 106 (*)    GFR calc non Af Amer 83 (*)    All other components within normal limits  CBC - Abnormal; Notable for the following:    Hemoglobin 12.6 (*)    HCT 38.3 (*)    RDW 16.4 (*)    All other components within normal limits  HEPATIC FUNCTION PANEL - Abnormal; Notable for the following:    Total Bilirubin 0.2 (*)    All other components within normal limits  LIPASE, BLOOD  I-STAT TROPOININ, ED    Imaging Review Dg Chest Port 1 View  06/13/2013   CLINICAL DATA:  Abdominal, shortness of breath  EXAM: PORTABLE CHEST - 1 VIEW  COMPARISON:  CT CHEST W/O CM dated 12/27/2012  FINDINGS: There is left apical pleural thickening, chronic. The lungs are hyperinflated likely secondary to COPD. There is no focal parenchymal opacity, pleural effusion, or pneumothorax. The heart and mediastinal contours are unremarkable.  The osseous structures are unremarkable.  IMPRESSION: No  active disease.   Electronically Signed   By: Kathreen Devoid   On: 06/13/2013 15:00  I personally reviewed the imaging tests through PACS system I reviewed available ER/hospitalization records through the EMR    EKG Interpretation None      MDM   Final diagnoses:  GERD (gastroesophageal reflux disease)    Overall well-appearing.  Significant improvement after the GI cocktail.  Gastroenterology followup.  Will increase Protonix to twice a day.    Hoy Morn, MD 06/13/13 [phone]

## 2013-06-26 ENCOUNTER — Other Ambulatory Visit: Payer: Self-pay | Admitting: *Deleted

## 2013-06-26 MED ORDER — AMLODIPINE BESYLATE 10 MG PO TABS: 10.0000 mg | ORAL_TABLET | Freq: Every day | ORAL | Status: DC

## 2013-06-26 MED ORDER — CLOPIDOGREL BISULFATE 75 MG PO TABS: 75.0000 mg | ORAL_TABLET | Freq: Every day | ORAL | Status: DC

## 2013-08-14 ENCOUNTER — Other Ambulatory Visit (HOSPITAL_COMMUNITY): Payer: Self-pay | Admitting: Cardiology

## 2013-08-30 ENCOUNTER — Encounter (HOSPITAL_COMMUNITY): Payer: Self-pay | Admitting: *Deleted

## 2013-08-30 ENCOUNTER — Ambulatory Visit (INDEPENDENT_AMBULATORY_CARE_PROVIDER_SITE_OTHER): Payer: Medicare Other | Admitting: Cardiology

## 2013-08-30 ENCOUNTER — Encounter: Payer: Self-pay | Admitting: Cardiology

## 2013-08-30 VITALS — BP 140/68 | HR 52 | Ht 75.0 in | Wt 184.9 lb

## 2013-08-30 DIAGNOSIS — I739 Peripheral vascular disease, unspecified: Secondary | ICD-10-CM

## 2013-08-30 DIAGNOSIS — I10 Essential (primary) hypertension: Secondary | ICD-10-CM

## 2013-08-30 DIAGNOSIS — R0989 Other specified symptoms and signs involving the circulatory and respiratory systems: Secondary | ICD-10-CM

## 2013-08-30 DIAGNOSIS — Z720 Tobacco use: Secondary | ICD-10-CM

## 2013-08-30 DIAGNOSIS — I2589 Other forms of chronic ischemic heart disease: Secondary | ICD-10-CM

## 2013-08-30 DIAGNOSIS — I2510 Atherosclerotic heart disease of native coronary artery without angina pectoris: Secondary | ICD-10-CM

## 2013-08-30 DIAGNOSIS — F17200 Nicotine dependence, unspecified, uncomplicated: Secondary | ICD-10-CM

## 2013-08-30 DIAGNOSIS — I255 Ischemic cardiomyopathy: Secondary | ICD-10-CM

## 2013-08-30 NOTE — Patient Instructions (Signed)
We will schedule you for carotid and lower extremity arterial dopplers.  Quit smoking.  Continue your current medication.  I will see you in 6 months.

## 2013-08-31 NOTE — Progress Notes (Signed)
[name]: 11-27-1933 Medical Record #[id number]  History of Present Illness: [name] is seen back today for a followup visit. He has known CAD with prior DES to LCX in 2006, repeat cath in 2011 and in June of 2014. He has chronic occlusion of the mid RCA and of the distal circumflex - managed medically, ischemic CM with EF 30 to 35%, ongoing tobacco abuse, SVT - on amiodarone, COPD, history of lung cancer and HTN.  On followup today he reports that he is doing well from a heart standpoint. He has no significant chest pain. His wife passed away one month ago with cancer. His major complaint today is that his legs give out easily. He complains that his balance is poor. He does have a history of PAD and has seen Dr. Donnetta Hutching in the past. It has been over three years since his last evaluation. He was seen in the ED in April with severe heartburn- resolved with GI cocktail. He denies any palpitations or increase shortness of breath.  Current Outpatient Prescriptions  Medication Sig Dispense Refill  . acetaminophen (TYLENOL) 500 MG tablet Take 250 mg by mouth daily as needed for pain.      Marland Kitchen albuterol (PROVENTIL HFA;VENTOLIN HFA) 108 (90 BASE) MCG/ACT inhaler Inhale 2 puffs into the lungs 3 (three) times daily as needed for wheezing or shortness of breath.      Marland Kitchen amiodarone (PACERONE) 200 MG tablet Take 200 mg by mouth daily.      Marland Kitchen amiodarone (PACERONE) 200 MG tablet TAKE 1 TABLET DAILY  30 tablet  0  . amLODipine (NORVASC) 10 MG tablet Take 1 tablet (10 mg total) by mouth daily.  30 tablet  1  . aspirin EC 81 MG tablet Take 81 mg by mouth as needed (chest pain).      Marland Kitchen clopidogrel (PLAVIX) 75 MG tablet Take 1 tablet (75 mg total) by mouth daily.  30 tablet  1  . ezetimibe (ZETIA) 10 MG tablet Take 10 mg by mouth daily.      . fexofenadine (ALLEGRA) 180 MG tablet Take 180 mg by mouth daily as needed (allergies).       . iron polysaccharides (NU-IRON) 150 MG capsule Take 1 capsule by  mouth daily.      . isosorbide mononitrate (IMDUR) 60 MG 24 hr tablet Take 60 mg by mouth 2 (two) times daily.      Marland Kitchen losartan (COZAAR) 100 MG tablet Take 100 mg by mouth daily.      . nitroGLYCERIN (NITROSTAT) 0.4 MG SL tablet Place 0.4 mg under the tongue every 5 (five) minutes as needed for chest pain.      . pantoprazole (PROTONIX) 40 MG tablet Take 1 tablet (40 mg total) by mouth 2 (two) times daily.  60 tablet  0  . polyethylene glycol (MIRALAX / GLYCOLAX) packet Take 17 g by mouth 3 (three) times daily.      Marland Kitchen PRESCRIPTION MEDICATION Place 2 drops into both eyes 2 (two) times daily as needed (irritated eyes). Eye drop samples from doctor's office       No current facility-administered medications for this visit.    Allergies  Allergen Reactions  . Doxycycline     arthralgias  . Atorvastatin Rash  . Sulfa Drugs Cross Reactors Rash    Past Medical History  Diagnosis Date  . CAD (coronary artery disease)     a. s/p DES-LCx 2006 b. repeat cath 2011- 90% distal LCx ISR,  100% mid RCA CTO, diffuse nonobstructive dz, medical management c. 07/2012 cath- 100% distal LCx ISR, 100% mid RCA CTO, nonobstructive dz; EF 40-45%  . PVC (premature ventricular contraction)   . HTN (hypertension)   . Lung cancer     non small cell  . Paroxysmal a-fib   . PAD (peripheral artery disease)   . COPD (chronic obstructive pulmonary disease)   . LV dysfunction   . Anemia, iron deficiency     Past Surgical History  Procedure Laterality Date  . Lobectomy      left upper  . Hernia repair      x5  . Appendectomy    . Back surgery    . Cervical spine surgery    . Twenty four hour intraesophageal ph profile.  05/02/2001  . Aortogram with bilateral lower extremity runoff.  07/11/2003  . Fiberoptic bronchoscopy, mediastinum.  10/20/2005  . Left video-assisted thoracoscopic surgery, left  12/01/2005  . Esophagogastroduodenoscopy with biopsy.  04/14/2010    History  Smoking status  . Current Every  Day Smoker -- 0.50 packs/day  Smokeless tobacco  . Not on file    History  Alcohol Use No    History reviewed. No pertinent family history.  Review of Systems: The review of systems is per the HPI.  All other systems were reviewed and are negative.  Physical Exam: BP 140/68  Pulse 52  Ht 6\' 3"  (1.905 m)  Wt 184 lb 14.4 oz (83.87 kg)  BMI 23.11 kg/m2 Patient is very pleasant and in no acute distress. Skin is warm and dry. Color is normal.  HEENT is unremarkable. Normocephalic/atraumatic. PERRL. Sclera are nonicteric. Neck is supple. No masses. No JVD. He has a left carotid bruit. Lungs are coarse. Cardiac exam shows a regular rate and rhythm. Normal S1-2. No gallop or murmur. Abdomen is soft. Extremities are without edema. Gait and ROM are intact. No gross neurologic deficits noted.   LABORATORY DATA:      Chemistry      Component Value Date/Time   NA 132* 06/13/2013 1443   NA 136 12/20/2012 1520   K 3.8 06/13/2013 1443   K 4.4 12/20/2012 1520   CL 94* 06/13/2013 1443   CO2 26 06/13/2013 1443   CO2 26 12/20/2012 1520   BUN 15 06/13/2013 1443   BUN 10.1 12/20/2012 1520   CREATININE 0.82 06/13/2013 1443   CREATININE 1.1 12/20/2012 1520      Component Value Date/Time   CALCIUM 10.0 06/13/2013 1443   CALCIUM 9.9 12/20/2012 1520   ALKPHOS 66 06/13/2013 1556   ALKPHOS 67 12/20/2012 1520   AST 24 06/13/2013 1556   AST 13 12/20/2012 1520   ALT 14 06/13/2013 1556   ALT 10 12/20/2012 1520   BILITOT 0.2* 06/13/2013 1556   BILITOT 0.38 12/20/2012 1520     Lab Results  Component Value Date   CHOL 166 06/16/2011   HDL 61.30 06/16/2011   LDLCALC 91 06/16/2011   TRIG 70.0 06/16/2011   CHOLHDL 3 06/16/2011   Lab Results  Component Value Date   WBC 9.9 06/13/2013   HGB 12.6* 06/13/2013   HCT 38.3* 06/13/2013   MCV 79.6 06/13/2013   PLT 220 06/13/2013   Assessment / Plan: 1. CAD - chronic total occlusion of the mid RCA and distal circumflex. We'll continue medical management.  2. HTN  - BP control is satisfactory.   3. Tobacco abuse - not interested in stopping-counseled smoking cessation.  4. Situational stress  5. HLD - on Zetia- intolerant to statins.  6. Ischemic CM - EF by echo of 40-45% consistent with his cath findings. No evidence of active CHF.  7. SVT - now on amiodarone 200 mg daily.   8. PAD with chronic claudication and left carotid bruit. Will schedule for LE arterial dopplers and carotid dopplers.

## 2013-09-01 ENCOUNTER — Other Ambulatory Visit: Payer: Self-pay | Admitting: Cardiology

## 2013-09-12 ENCOUNTER — Other Ambulatory Visit (HOSPITAL_COMMUNITY): Payer: Self-pay | Admitting: Physician Assistant

## 2013-09-12 ENCOUNTER — Ambulatory Visit (HOSPITAL_COMMUNITY)
Admission: RE | Admit: 2013-09-12 | Discharge: 2013-09-12 | Disposition: A | Discharge status: Home / Self Care | Payer: Medicare Other | Source: Ambulatory Visit | Attending: Cardiovascular Disease | Admitting: Cardiovascular Disease

## 2013-09-12 ENCOUNTER — Other Ambulatory Visit (HOSPITAL_COMMUNITY): Payer: Self-pay | Admitting: Cardiology

## 2013-09-12 DIAGNOSIS — R0989 Other specified symptoms and signs involving the circulatory and respiratory systems: Secondary | ICD-10-CM | POA: Diagnosis not present

## 2013-09-12 DIAGNOSIS — F17200 Nicotine dependence, unspecified, uncomplicated: Secondary | ICD-10-CM | POA: Diagnosis not present

## 2013-09-12 DIAGNOSIS — I2510 Atherosclerotic heart disease of native coronary artery without angina pectoris: Secondary | ICD-10-CM | POA: Diagnosis present

## 2013-09-12 DIAGNOSIS — I739 Peripheral vascular disease, unspecified: Secondary | ICD-10-CM | POA: Diagnosis not present

## 2013-09-12 DIAGNOSIS — Z720 Tobacco use: Secondary | ICD-10-CM

## 2013-09-12 DIAGNOSIS — I2589 Other forms of chronic ischemic heart disease: Secondary | ICD-10-CM | POA: Diagnosis not present

## 2013-09-12 DIAGNOSIS — I255 Ischemic cardiomyopathy: Secondary | ICD-10-CM

## 2013-09-12 DIAGNOSIS — I10 Essential (primary) hypertension: Secondary | ICD-10-CM | POA: Insufficient documentation

## 2013-09-12 NOTE — Progress Notes (Signed)
Lower Extremity Arterial Duplex Completed. °Brianna L Mazza,RVT °

## 2013-09-12 NOTE — Progress Notes (Signed)
Carotid Duplex Completed. °Brianna L Mazza,RVT °

## 2013-09-13 NOTE — Telephone Encounter (Signed)
Patient called 09/12/13 lower ext dopplers abnormal.Appointment scheduled with Dr.Berry 10/25/13 at 3:15 pm.

## 2013-09-15 ENCOUNTER — Telehealth: Payer: Self-pay

## 2013-09-15 ENCOUNTER — Other Ambulatory Visit: Payer: Self-pay

## 2013-09-15 MED ORDER — ALBUTEROL SULFATE HFA 108 (90 BASE) MCG/ACT IN AERS: 2.0000 | INHALATION_SPRAY | Freq: Four times a day (QID) | RESPIRATORY_TRACT | Status: DC | PRN

## 2013-09-15 NOTE — Addendum Note (Signed)
Addended by: Golden Hurter D on: 09/15/2013 10:28 AM   Modules accepted: Orders

## 2013-09-15 NOTE — Telephone Encounter (Signed)
OK to refill ProAir inhaler.  Evrett Hakim [place] MD, [facility]

## 2013-09-15 NOTE — Telephone Encounter (Signed)
Received call from patient he wanted a refill for [employer] inhaler.Stated Albuterol cost too mch.Stated he has not seen his PCP in a long time wanted to know if Dr.Jordan would refill.Message sent to [place].

## 2013-09-15 NOTE — Telephone Encounter (Signed)
Returned call to patient Dr.Jordan okayed refill for [employer].Refill phoned into pharmacy.

## 2013-10-25 ENCOUNTER — Encounter: Payer: Self-pay | Admitting: Cardiovascular Disease

## 2013-10-25 ENCOUNTER — Ambulatory Visit (INDEPENDENT_AMBULATORY_CARE_PROVIDER_SITE_OTHER): Payer: Medicare Other | Admitting: Cardiovascular Disease

## 2013-10-25 VITALS — BP 132/80 | HR 57 | Ht 75.0 in | Wt 189.0 lb

## 2013-10-25 DIAGNOSIS — D689 Coagulation defect, unspecified: Secondary | ICD-10-CM

## 2013-10-25 DIAGNOSIS — Z79899 Other long term (current) drug therapy: Secondary | ICD-10-CM

## 2013-10-25 DIAGNOSIS — I739 Peripheral vascular disease, unspecified: Secondary | ICD-10-CM

## 2013-10-25 DIAGNOSIS — R5383 Other fatigue: Secondary | ICD-10-CM

## 2013-10-25 DIAGNOSIS — Z01818 Encounter for other preprocedural examination: Secondary | ICD-10-CM

## 2013-10-25 DIAGNOSIS — R5381 Other malaise: Secondary | ICD-10-CM

## 2013-10-25 NOTE — Assessment & Plan Note (Signed)
History of lower extremity stenting remotely by Dr. Donnetta Hutching. He has Lescol limiting claudication with recent lower extremity Doppler studies that reveal ABIs in the 0.5 0.6 range bilaterally. He has occluded SFAs, high grade right common iliac and moderate left iliac stenosis. We will plan to perform angiography and potential intervention.

## 2013-10-25 NOTE — Patient Instructions (Signed)
Dr. [name] has ordered a peripheral angiogram to be done at [facility].  This procedure is going to look at the bloodflow in your lower extremities.  If Dr. [name] is able to open up the arteries, you will have to spend one night in the hospital.  If he is not able to open the arteries, you will be able to go home that same day.    After the procedure, you will not be allowed to drive for 3 days or push, pull, or lift anything greater than 10 lbs for one week.    You will be required to have the following tests prior to the procedure:  1. Blood work-the blood work can be done no more than 7 days prior to the procedure.  It can be done at any [facility] lab.  There is one downstairs on the first floor of this building and one in the [place] ([address])  2. Chest Xray-the chest xray order has already been placed at the [place] [facility].     *REPS n/a

## 2013-10-25 NOTE — Progress Notes (Signed)
10/25/2013 [name]   12-03-1933  [id number]  Primary Physician Chesley Noon, MD Primary Cardiologist: Lorretta Harp MD Renae Gloss   HPI:  Mr. Cocozza is a 80 year old thin appearing recently widowed Caucasian male patient of Dr. Collier Salina Dorton's referred for peripheral vascular evaluation. 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 Medical therapy was recommended. He does have ischemic cardiac myopathy with an EF of 30-35% with ongoing tobacco abuse his other problems include history of lung cancer and COPD as well as hypertension and hyperlipidemia. He has lifestyle limiting claudication as well for which he was referred.   Current Outpatient Prescriptions  Medication Sig Dispense Refill  . acetaminophen (TYLENOL) 500 MG tablet Take 250 mg by mouth daily as needed for pain.      Marland Kitchen albuterol (PROVENTIL HFA;VENTOLIN HFA) 108 (90 BASE) MCG/ACT inhaler Inhale 2 puffs into the lungs every 6 (six) hours as needed for wheezing or shortness of breath.  1 Inhaler  6  . amiodarone (PACERONE) 200 MG tablet Take 200 mg by mouth daily.      Marland Kitchen amiodarone (PACERONE) 200 MG tablet TAKE 1 TABLET DAILY  30 tablet  6  . amLODipine (NORVASC) 10 MG tablet Take 1 tablet (10 mg total) by mouth daily.  30 tablet  5  . aspirin EC 81 MG tablet Take 81 mg by mouth as needed (chest pain).      Marland Kitchen clopidogrel (PLAVIX) 75 MG tablet Take 1 tablet (75 mg total) by mouth daily.  30 tablet  5  . ezetimibe (ZETIA) 10 MG tablet Take 10 mg by mouth daily.      . fexofenadine (ALLEGRA) 180 MG tablet Take 180 mg by mouth daily as needed (allergies).       . iron polysaccharides (NU-IRON) 150 MG capsule Take 1 capsule by mouth daily.      . isosorbide mononitrate (IMDUR) 60 MG 24 hr tablet Take 60 mg by mouth 2 (two) times daily.      Marland Kitchen losartan (COZAAR) 100 MG tablet Take 100 mg by mouth daily.      .  nitroGLYCERIN (NITROSTAT) 0.4 MG SL tablet Place 0.4 mg under the tongue every 5 (five) minutes as needed for chest pain.      . pantoprazole (PROTONIX) 40 MG tablet TAKE 1 TABLET ONCE A DAY  30 tablet  3  . polyethylene glycol (MIRALAX / GLYCOLAX) packet Take 17 g by mouth 3 (three) times daily.      Marland Kitchen PRESCRIPTION MEDICATION Place 2 drops into both eyes 2 (two) times daily as needed (irritated eyes). Eye drop samples from doctor's office       No current facility-administered medications for this visit.    Allergies  Allergen Reactions  . Doxycycline     arthralgias  . Atorvastatin Rash  . Sulfa Drugs Cross Reactors Rash    History   Social History  . Marital Status: Married    Spouse Name: N/A    Number of Children: 2  . Years of Education: N/A   Occupational History  . fabricator    Social History Main Topics  . Smoking status: Current Every Day Smoker -- 0.50 packs/day  . Smokeless tobacco: Not on file  . Alcohol Use: No  . Drug Use: No  . Sexual Activity: No   Other Topics Concern  . Not on file  Social History Narrative  . No narrative on file     Review of Systems: General: negative for chills, fever, night sweats or weight changes.  Cardiovascular: negative for chest pain, dyspnea on exertion, edema, orthopnea, palpitations, paroxysmal nocturnal dyspnea or shortness of breath Dermatological: negative for rash Respiratory: negative for cough or wheezing Urologic: negative for hematuria Abdominal: negative for nausea, vomiting, diarrhea, bright red blood per rectum, melena, or hematemesis Neurologic: negative for visual changes, syncope, or dizziness All other systems reviewed and are otherwise negative except as noted above.    Blood pressure 132/80, pulse 57, height 6\' 3"  (1.905 m), weight 189 lb (85.73 kg).  General appearance: alert and no distress Neck: no adenopathy, no JVD, supple, symmetrical, trachea midline, thyroid not enlarged, symmetric, no  tenderness/mass/nodules and bilateral carotid bruits left louder than right Lungs: clear to auscultation bilaterally Heart: regular rate and rhythm, S1, S2 normal, no murmur, click, rub or gallop Extremities: extremities normal, atraumatic, no cyanosis or edema and 2+ femoral pulses with bifemoral bruits  EKG not performed today  ASSESSMENT AND PLAN:   PAD (peripheral artery disease) History of lower extremity stenting remotely by Dr. Donnetta Hutching. He has Lescol limiting claudication with recent lower extremity Doppler studies that reveal ABIs in the 0.5 0.6 range bilaterally. He has occluded SFAs, high grade right common iliac and moderate left iliac stenosis. We will plan to perform angiography and potential intervention.      Lorretta Harp MD FACP,FACC,FAHA, [facility] 10/25/2013 4:23 PM

## 2013-11-02 ENCOUNTER — Telehealth: Payer: Self-pay | Admitting: Cardiology

## 2013-11-02 NOTE — Telephone Encounter (Signed)
Clearance letter taken to Dr. Doug Sou nurse from Dr. Liliane Channel office

## 2013-11-03 ENCOUNTER — Other Ambulatory Visit (HOSPITAL_COMMUNITY): Payer: Self-pay | Admitting: Cardiology

## 2013-11-07 ENCOUNTER — Telehealth: Payer: Self-pay

## 2013-11-07 NOTE — Telephone Encounter (Signed)
Received call from Amy with Dr.Medoff's office she stated patient is scheduled for a colonoscopy.Stated she faxed a form for Dr.Jordan to sign.I never received form. Stated wanted to ask Dr.Jordan if ok to hold plavix.Dr.Jordan out of office this week will ask him next week and call back.

## 2013-11-08 ENCOUNTER — Telehealth: Payer: Self-pay

## 2013-11-08 NOTE — Telephone Encounter (Signed)
Received clearance from Dr.Medoff's office.Dr.Jordan advised stop plavix 5 days prior to colonoscopy and may restart after colonoscopy.Faxed to [phone].

## 2013-11-08 NOTE — Telephone Encounter (Signed)
Dr.Jordan advised stop plavix 5 days prior to colonoscopy may restart after colonoscopy.Note faxed to Dr.Medoff at fax # [phone].

## 2013-11-10 ENCOUNTER — Observation Stay (HOSPITAL_COMMUNITY)
Admission: EM | Admit: 2013-11-10 | Discharge: 2013-11-13 | Disposition: A | Discharge status: Home / Self Care | Payer: Medicare Other | Attending: Internal Medicine | Admitting: Internal Medicine

## 2013-11-10 ENCOUNTER — Encounter (HOSPITAL_COMMUNITY): Payer: Self-pay | Admitting: Emergency Medicine

## 2013-11-10 ENCOUNTER — Inpatient Hospital Stay (HOSPITAL_COMMUNITY): Payer: Medicare Other

## 2013-11-10 ENCOUNTER — Emergency Department (HOSPITAL_COMMUNITY): Payer: Medicare Other

## 2013-11-10 DIAGNOSIS — J449 Chronic obstructive pulmonary disease, unspecified: Secondary | ICD-10-CM | POA: Diagnosis present

## 2013-11-10 DIAGNOSIS — R5383 Other fatigue: Secondary | ICD-10-CM

## 2013-11-10 DIAGNOSIS — I951 Orthostatic hypotension: Secondary | ICD-10-CM | POA: Diagnosis not present

## 2013-11-10 DIAGNOSIS — I2589 Other forms of chronic ischemic heart disease: Secondary | ICD-10-CM | POA: Insufficient documentation

## 2013-11-10 DIAGNOSIS — I739 Peripheral vascular disease, unspecified: Secondary | ICD-10-CM | POA: Insufficient documentation

## 2013-11-10 DIAGNOSIS — Z85118 Personal history of other malignant neoplasm of bronchus and lung: Secondary | ICD-10-CM | POA: Insufficient documentation

## 2013-11-10 DIAGNOSIS — I4891 Unspecified atrial fibrillation: Secondary | ICD-10-CM | POA: Insufficient documentation

## 2013-11-10 DIAGNOSIS — R5381 Other malaise: Secondary | ICD-10-CM

## 2013-11-10 DIAGNOSIS — I498 Other specified cardiac arrhythmias: Secondary | ICD-10-CM | POA: Diagnosis not present

## 2013-11-10 DIAGNOSIS — E785 Hyperlipidemia, unspecified: Secondary | ICD-10-CM | POA: Insufficient documentation

## 2013-11-10 DIAGNOSIS — R531 Weakness: Secondary | ICD-10-CM

## 2013-11-10 DIAGNOSIS — D509 Iron deficiency anemia, unspecified: Secondary | ICD-10-CM | POA: Diagnosis not present

## 2013-11-10 DIAGNOSIS — Z881 Allergy status to other antibiotic agents status: Secondary | ICD-10-CM | POA: Diagnosis not present

## 2013-11-10 DIAGNOSIS — R42 Dizziness and giddiness: Principal | ICD-10-CM | POA: Insufficient documentation

## 2013-11-10 DIAGNOSIS — Z882 Allergy status to sulfonamides status: Secondary | ICD-10-CM | POA: Insufficient documentation

## 2013-11-10 DIAGNOSIS — Z79899 Other long term (current) drug therapy: Secondary | ICD-10-CM | POA: Insufficient documentation

## 2013-11-10 DIAGNOSIS — I10 Essential (primary) hypertension: Secondary | ICD-10-CM | POA: Diagnosis not present

## 2013-11-10 DIAGNOSIS — I2510 Atherosclerotic heart disease of native coronary artery without angina pectoris: Secondary | ICD-10-CM | POA: Insufficient documentation

## 2013-11-10 DIAGNOSIS — I255 Ischemic cardiomyopathy: Secondary | ICD-10-CM | POA: Diagnosis present

## 2013-11-10 DIAGNOSIS — Z888 Allergy status to other drugs, medicaments and biological substances status: Secondary | ICD-10-CM | POA: Diagnosis not present

## 2013-11-10 DIAGNOSIS — I4949 Other premature depolarization: Secondary | ICD-10-CM | POA: Insufficient documentation

## 2013-11-10 DIAGNOSIS — I471 Supraventricular tachycardia: Secondary | ICD-10-CM | POA: Diagnosis present

## 2013-11-10 DIAGNOSIS — J438 Other emphysema: Secondary | ICD-10-CM | POA: Insufficient documentation

## 2013-11-10 LAB — BASIC METABOLIC PANEL
Anion gap: 11 (ref 5–15)
BUN: 5 mg/dL — ABNORMAL LOW (ref 6–23)
CALCIUM: 10.2 mg/dL (ref 8.4–10.5)
CO2: 27 mEq/L (ref 19–32)
CREATININE: 0.8 mg/dL (ref 0.50–1.35)
Chloride: 97 mEq/L (ref 96–112)
GFR calc Af Amer: >90 mL/min (ref >90)
GFR calc non Af Amer: 83 mL/min — ABNORMAL LOW (ref >90)
GLUCOSE: 93 mg/dL (ref 70–99)
Potassium: 3.8 mEq/L (ref 3.7–5.3)
Sodium: 135 mEq/L — ABNORMAL LOW (ref 137–147)

## 2013-11-10 LAB — CBC
HCT: 31.9 % — ABNORMAL LOW (ref 39.0–52.0)
HEMOGLOBIN: 10.1 g/dL — AB (ref 13.0–17.0)
MCH: 23.2 pg — AB (ref 26.0–34.0)
MCHC: 31.7 g/dL (ref 30.0–36.0)
MCV: 73.2 fL — AB (ref 78.0–100.0)
Platelets: 296 10*3/uL (ref 150–400)
RBC: 4.36 MIL/uL (ref 4.22–5.81)
RDW: 16.8 % — ABNORMAL HIGH (ref 11.5–15.5)
WBC: 6.3 10*3/uL (ref 4.0–10.5)

## 2013-11-10 LAB — ABO/RH: ABO/RH(D): B POS

## 2013-11-10 LAB — TYPE AND SCREEN
ABO/RH(D): B POS
Antibody Screen: NEGATIVE

## 2013-11-10 LAB — RETICULOCYTES
RBC.: 4.26 MIL/uL (ref 4.22–5.81)
RETIC COUNT ABSOLUTE: 63.9 10*3/uL (ref 19.0–186.0)
RETIC CT PCT: 1.5 % (ref 0.4–3.1)

## 2013-11-10 LAB — URINALYSIS, ROUTINE W REFLEX MICROSCOPIC
Bilirubin Urine: NEGATIVE
Glucose, UA: NEGATIVE mg/dL
Hgb urine dipstick: NEGATIVE
Ketones, ur: NEGATIVE mg/dL
LEUKOCYTES UA: NEGATIVE
NITRITE: NEGATIVE
PH: 7.5 (ref 5.0–8.0)
Protein, ur: NEGATIVE mg/dL
SPECIFIC GRAVITY, URINE: 1.006 (ref 1.005–1.030)
Urobilinogen, UA: 0.2 mg/dL (ref 0.0–1.0)

## 2013-11-10 LAB — TROPONIN I: Troponin I: <0.3 ng/mL (ref <0.30)

## 2013-11-10 LAB — PRO B NATRIURETIC PEPTIDE: Pro B Natriuretic peptide (BNP): 224.4 pg/mL (ref 0–450)

## 2013-11-10 LAB — CBG MONITORING, ED: Glucose-Capillary: 92 mg/dL (ref 70–99)

## 2013-11-10 LAB — PROTIME-INR
INR: 1.03 (ref 0.00–1.49)
Prothrombin Time: 13.5 seconds (ref 11.6–15.2)

## 2013-11-10 MED ORDER — ONDANSETRON HCL 4 MG/2ML IJ SOLN: 4.0000 mg | Freq: Four times a day (QID) | INTRAMUSCULAR | Status: DC | PRN

## 2013-11-10 MED ORDER — GUAIFENESIN-DM 100-10 MG/5ML PO SYRP: 5.0000 mL | ORAL_SOLUTION | ORAL | Status: DC | PRN

## 2013-11-10 MED ORDER — ISOSORBIDE MONONITRATE ER 60 MG PO TB24
60.0000 mg | ORAL_TABLET | Freq: Two times a day (BID) | ORAL | Status: DC
  Filled 2013-11-10: qty 1

## 2013-11-10 MED ORDER — LOSARTAN POTASSIUM 50 MG PO TABS
100.0000 mg | ORAL_TABLET | Freq: Every day | ORAL | Status: DC
  Filled 2013-11-10 (×4): qty 2

## 2013-11-10 MED ORDER — ALBUTEROL SULFATE HFA 108 (90 BASE) MCG/ACT IN AERS: 2.0000 | INHALATION_SPRAY | Freq: Four times a day (QID) | RESPIRATORY_TRACT | Status: DC | PRN

## 2013-11-10 MED ORDER — SODIUM CHLORIDE 0.9 % IV SOLN
INTRAVENOUS | Status: DC
  Administered 2013-11-10: 23:00:00 via INTRAVENOUS

## 2013-11-10 MED ORDER — LEVALBUTEROL HCL 0.63 MG/3ML IN NEBU
0.6300 mg | INHALATION_SOLUTION | Freq: Four times a day (QID) | RESPIRATORY_TRACT | Status: DC
  Administered 2013-11-10: 0.63 mg via RESPIRATORY_TRACT
  Filled 2013-11-10 (×2): qty 3

## 2013-11-10 MED ORDER — ACETAMINOPHEN 500 MG PO TABS: 250.0000 mg | ORAL_TABLET | Freq: Every day | ORAL | Status: DC | PRN

## 2013-11-10 MED ORDER — ONDANSETRON HCL 4 MG PO TABS: 4.0000 mg | ORAL_TABLET | Freq: Four times a day (QID) | ORAL | Status: DC | PRN

## 2013-11-10 MED ORDER — HYDROCODONE-ACETAMINOPHEN 5-325 MG PO TABS: 1.0000 | ORAL_TABLET | ORAL | Status: DC | PRN

## 2013-11-10 MED ORDER — PANTOPRAZOLE SODIUM 40 MG PO TBEC
40.0000 mg | DELAYED_RELEASE_TABLET | Freq: Every day | ORAL | Status: DC
  Administered 2013-11-11: 40 mg via ORAL
  Filled 2013-11-10: qty 1

## 2013-11-10 MED ORDER — AZITHROMYCIN 500 MG PO TABS
500.0000 mg | ORAL_TABLET | Freq: Every day | ORAL | Status: DC
  Administered 2013-11-10 – 2013-11-11 (×2): 500 mg via ORAL
  Filled 2013-11-10 (×2): qty 1

## 2013-11-10 MED ORDER — LORATADINE 10 MG PO TABS
10.0000 mg | ORAL_TABLET | Freq: Every day | ORAL | Status: DC
  Administered 2013-11-11 – 2013-11-13 (×3): 10 mg via ORAL
  Filled 2013-11-10 (×3): qty 1

## 2013-11-10 MED ORDER — ASPIRIN EC 81 MG PO TBEC
81.0000 mg | DELAYED_RELEASE_TABLET | Freq: Every day | ORAL | Status: DC | PRN
  Filled 2013-11-10: qty 1

## 2013-11-10 MED ORDER — AMIODARONE HCL 200 MG PO TABS
200.0000 mg | ORAL_TABLET | Freq: Every day | ORAL | Status: DC
  Administered 2013-11-10 – 2013-11-13 (×4): 200 mg via ORAL
  Filled 2013-11-10 (×4): qty 1

## 2013-11-10 MED ORDER — INFLUENZA VAC SPLIT QUAD 0.5 ML IM SUSY: 0.5000 mL | PREFILLED_SYRINGE | INTRAMUSCULAR | Status: DC

## 2013-11-10 MED ORDER — EZETIMIBE 10 MG PO TABS
10.0000 mg | ORAL_TABLET | Freq: Every day | ORAL | Status: DC
  Filled 2013-11-10 (×2): qty 1

## 2013-11-10 MED ORDER — POLYSACCHARIDE IRON COMPLEX 150 MG PO CAPS
150.0000 mg | ORAL_CAPSULE | Freq: Every day | ORAL | Status: DC
  Administered 2013-11-11 – 2013-11-13 (×3): 150 mg via ORAL
  Filled 2013-11-10 (×3): qty 1

## 2013-11-10 MED ORDER — AMLODIPINE BESYLATE 5 MG PO TABS
5.0000 mg | ORAL_TABLET | Freq: Every day | ORAL | Status: DC
  Administered 2013-11-10 – 2013-11-13 (×4): 5 mg via ORAL
  Filled 2013-11-10 (×4): qty 1

## 2013-11-10 MED ORDER — NITROGLYCERIN 0.4 MG SL SUBL: 0.4000 mg | SUBLINGUAL_TABLET | SUBLINGUAL | Status: DC | PRN

## 2013-11-10 MED ORDER — POLYETHYLENE GLYCOL 3350 17 G PO PACK
17.0000 g | PACK | Freq: Three times a day (TID) | ORAL | Status: DC
  Administered 2013-11-12 – 2013-11-13 (×4): 17 g via ORAL
  Filled 2013-11-10 (×10): qty 1

## 2013-11-10 MED ORDER — SODIUM CHLORIDE 0.9 % IV SOLN
INTRAVENOUS | Status: DC
  Administered 2013-11-10: 17:00:00 via INTRAVENOUS

## 2013-11-10 MED ORDER — ISOSORBIDE MONONITRATE ER 60 MG PO TB24
60.0000 mg | ORAL_TABLET | Freq: Every evening | ORAL | Status: DC
  Administered 2013-11-10 – 2013-11-12 (×3): 60 mg via ORAL
  Filled 2013-11-10 (×4): qty 1

## 2013-11-10 MED ORDER — LEVALBUTEROL HCL 0.63 MG/3ML IN NEBU: 0.6300 mg | INHALATION_SOLUTION | RESPIRATORY_TRACT | Status: DC | PRN

## 2013-11-10 NOTE — H&P (Signed)
[facility] History and Physical  AMED DATTA OYD:[id number] DOB: [birth year] DOA: 11/10/2013  Referring physician: ED physician PCP: Chesley Noon, MD   Chief Complaint: dizziness  HPI:  Pt is 80 yo male who presented to [facility] ED with main concern of intermittent episodes of dizziness, worse over the past several weeks. Pt has family at bedside who explained that pt has had colonoscopy done one day prior to this admission and this AM he was unable to get out of the bed. There were told that pt was iron deficient and will need iron supplementation. Pt currently denies chest pain or shortness of breath. No bad or urinary concerns, no cough, no fevers, chills, no numbness and tingling.   In ED, Pt noted to be hemodynamically stable. [facility] asked to admit for further evaluation of dizziness.   Assessment and Plan: Active Problems: Dizziness - unclear etiology - admit to telemetry bed - start with checking TSH, CE's, 12 lead EKG - also check orthostatic vitals, anemia panel - repeat CBC in AM and transfuse as indicated - PT/OT evaluation, vestibular evaluation  - CT head pending  COPD - mild expiratory wheezing noted - will place on BD's scheduled and as needed - provide oxygen as well  - given productive cough will place on empiric Zithromax for now  HTN - continue Cozaar as per home medical regimen   Radiological Exams on Admission: Dg Chest 2 View  11/10/2013   COPD/emphysema. Prior left upper lobectomy with postsurgical pleuroparenchymal scarring. Scar/fibrosis in the apex of the remaining left lung. No acute cardiopulmonary disease.     Code Status: Full Family Communication: Pt and family at bedside Disposition Plan: Admit for further evaluation     Review of Systems:  Constitutional: Negative for fever, chills and malaise/fatigue. Negative for diaphoresis.  HENT: Negative for hearing loss, ear pain, nosebleeds, congestion, sore throat, neck pain, tinnitus and ear  discharge.   Eyes: Negative for blurred vision, double vision, photophobia, pain, discharge and redness.  Respiratory: Negative for hemoptysis, sputum production, shortness of breath, wheezing and stridor.   Cardiovascular: Negative for chest pain, palpitations, orthopnea, claudication and leg swelling.  Gastrointestinal: Negative for nausea, vomiting and abdominal pain.  Genitourinary: Negative for dysuria, urgency, frequency, hematuria and flank pain.  Musculoskeletal: Negative for myalgias, back pain, joint pain and falls.  Skin: Negative for itching and rash.  Neurological: per HPI Endo/Heme/Allergies: Negative for environmental allergies and polydipsia. Does not bruise/bleed easily.  Psychiatric/Behavioral: Negative for suicidal ideas. The patient is not nervous/anxious.      Past Medical History  Diagnosis Date  . CAD (coronary artery disease)     a. s/p DES-LCx 2006 b. repeat cath 2011- 90% distal LCx ISR, 100% mid RCA CTO, diffuse nonobstructive dz, medical management c. 07/2012 cath- 100% distal LCx ISR, 100% mid RCA CTO, nonobstructive dz; EF 40-45%  . PVC (premature ventricular contraction)   . HTN (hypertension)   . Lung cancer     non small cell  . Paroxysmal a-fib   . PAD (peripheral artery disease)     history of stenting in the past  . COPD (chronic obstructive pulmonary disease)   . LV dysfunction   . Anemia, iron deficiency     Past Surgical History  Procedure Laterality Date  . Lobectomy      left upper  . Hernia repair      x5  . Appendectomy    . Back surgery    . Cervical spine surgery    .  Twenty four hour intraesophageal ph profile.  05/02/2001  . Aortogram with bilateral lower extremity runoff.  07/11/2003  . Fiberoptic bronchoscopy, mediastinum.  10/20/2005  . Left video-assisted thoracoscopic surgery, left  12/01/2005  . Esophagogastroduodenoscopy with biopsy.  04/14/2010    Social History:  reports that he has been smoking.  He has never used  smokeless tobacco. He reports that he does not drink alcohol or use illicit drugs.  Allergies  Allergen Reactions  . Doxycycline     arthralgias  . Atorvastatin Rash  . Sulfa Drugs Cross Reactors Rash    History reviewed. No pertinent family history.  Prior to Admission medications   Medication Sig Start Date End Date Taking? Authorizing Provider  acetaminophen (TYLENOL) 500 MG tablet Take 250 mg by mouth daily as needed (headache).    Yes Historical Provider, MD  albuterol (PROVENTIL HFA;VENTOLIN HFA) 108 (90 BASE) MCG/ACT inhaler Inhale 2 puffs into the lungs every 6 (six) hours as needed for wheezing or shortness of breath. 09/15/13  Yes Peter M Martinique, MD  amiodarone (PACERONE) 200 MG tablet Take 200 mg by mouth daily.   Yes Historical Provider, MD  amLODipine (NORVASC) 10 MG tablet Take 1 tablet (10 mg total) by mouth daily. 09/01/13  Yes Peter M Martinique, MD  aspirin EC 81 MG tablet Take 81 mg by mouth as needed (chest pain).   Yes Historical Provider, MD  clopidogrel (PLAVIX) 75 MG tablet Take 1 tablet (75 mg total) by mouth daily. 09/01/13  Yes Peter M Martinique, MD  ezetimibe (ZETIA) 10 MG tablet Take 10 mg by mouth daily.   Yes Historical Provider, MD  fexofenadine (ALLEGRA) 180 MG tablet Take 180 mg by mouth daily as needed (allergies).    Yes Historical Provider, MD  iron polysaccharides (NU-IRON) 150 MG capsule Take 1 capsule by mouth daily. 07/18/13 07/18/14 Yes Historical Provider, MD  isosorbide mononitrate (IMDUR) 60 MG 24 hr tablet Take 60 mg by mouth 2 (two) times daily.   Yes Historical Provider, MD  pantoprazole (PROTONIX) 40 MG tablet Take 40 mg by mouth daily.   Yes Historical Provider, MD  polyethylene glycol (MIRALAX / GLYCOLAX) packet Take 17 g by mouth 3 (three) times daily.   Yes Historical Provider, MD  losartan (COZAAR) 100 MG tablet Take 100 mg by mouth daily.    Historical Provider, MD  nitroGLYCERIN (NITROSTAT) 0.4 MG SL tablet Place 0.4 mg under the tongue every 5  (five) minutes as needed for chest pain (Chest pain).     Historical Provider, MD    Physical Exam: Filed Vitals:   11/10/13 1438 11/10/13 1634 11/10/13 1802  BP: 147/60 142/66 155/65  Pulse: 60 59 68  Temp: 98 F (36.7 C)    TempSrc: Oral    Resp:  16   SpO2: 99% 96% 94%    Physical Exam  Constitutional: Appears well-developed and well-nourished. No distress.  HENT: Normocephalic. External right and left ear normal.  Eyes: Conjunctivae and EOM are normal. PERRLA, no scleral icterus.  Neck: Normal ROM. Neck supple. No JVD. No tracheal deviation. No thyromegaly.  CVS: RRR, S1/S2 , no gallops, no carotid bruit.  Pulmonary: Effort and breath sounds normal, no stridor, mild expiratory wheezing  Abdominal: Soft. BS +,  no distension, tenderness, rebound or guarding.  Musculoskeletal: Normal range of motion.  Lymphadenopathy: No lymphadenopathy noted, cervical, inguinal. Neuro: Alert. Normal reflexes, muscle tone coordination. No cranial nerve deficit. Skin: Skin is warm and dry. No rash noted. Not diaphoretic. No erythema.  No pallor.  Psychiatric: Normal mood and affect. Behavior, judgment, thought content normal.   Labs on Admission:  Basic Metabolic Panel:  Recent Labs Lab 11/10/13 1447  NA 135*  K 3.8  CL 97  CO2 27  GLUCOSE 93  BUN 5*  CREATININE 0.80  CALCIUM 10.2   CBC:  Recent Labs Lab 11/10/13 1447  WBC 6.3  HGB 10.1*  HCT 31.9*  MCV 73.2*  PLT 296   Cardiac Enzymes:  Recent Labs Lab 11/10/13 1541  TROPONINI <0.30   CBG:  Recent Labs Lab 11/10/13 1458  GLUCAP 92    EKG: Normal sinus rhythm, no ST/T wave changes  Faye Ramsay, MD  [facility] Pager [phone]  If 7PM-7AM, please contact night-coverage [email or web address] Password [facility] 11/10/2013, 6:51 PM

## 2013-11-10 NOTE — ED Notes (Addendum)
Pt states that he has had low HGB (9.3) and was sent in for eval. States he's been weak, dizzy, and BP has been lower than his norm. Had colonoscopy yesterday for rectal bleeding. Family states that they did find a bleeding vessel but they think the doctor did not do anything about it. Pt is on plavix. Alert and oriented at this time. Neuro intact.

## 2013-11-10 NOTE — ED Notes (Signed)
MD at bedside. Dr. Doyle Askew, hospitalist, at bedside.

## 2013-11-10 NOTE — ED Notes (Signed)
Patient transported to CT 

## 2013-11-10 NOTE — ED Provider Notes (Signed)
CSN: [id number]     Arrival date & time 11/10/13  1425 History   First MD Initiated Contact with Patient 11/10/13 1508     Chief Complaint  Patient presents with  . Weakness     (Consider location/radiation/quality/duration/timing/severity/associated sxs/prior Treatment) HPI Comments: Pt here with weakness x 3 days worse with standing-bp at home has been lower than nl ( 12 sbp at home and normally sbp 150s)--increased cough without fever or chills-, no anginal chest pain--h/o dizziness for some time which was getting worse-some dark stools, but on iron tx and had a colonoscopy and endoscopy yesterday that showed active colonic bleeding site but no cautery done due to pt being on plavixx--sx persistent and worse with standing--no tx used for this prior arrival--pts hb 9.3 yesterday--no focal neuro findings  Patient is a 80 y.o. male presenting with weakness. The history is provided by the patient and a relative.  Weakness    Past Medical History  Diagnosis Date  . CAD (coronary artery disease)     a. s/p DES-LCx 2006 b. repeat cath 2011- 90% distal LCx ISR, 100% mid RCA CTO, diffuse nonobstructive dz, medical management c. 07/2012 cath- 100% distal LCx ISR, 100% mid RCA CTO, nonobstructive dz; EF 40-45%  . PVC (premature ventricular contraction)   . HTN (hypertension)   . Lung cancer     non small cell  . Paroxysmal a-fib   . PAD (peripheral artery disease)     history of stenting in the past  . COPD (chronic obstructive pulmonary disease)   . LV dysfunction   . Anemia, iron deficiency    Past Surgical History  Procedure Laterality Date  . Lobectomy      left upper  . Hernia repair      x5  . Appendectomy    . Back surgery    . Cervical spine surgery    . Twenty four hour intraesophageal ph profile.  05/02/2001  . Aortogram with bilateral lower extremity runoff.  07/11/2003  . Fiberoptic bronchoscopy, mediastinum.  10/20/2005  . Left video-assisted thoracoscopic surgery,  left  12/01/2005  . Esophagogastroduodenoscopy with biopsy.  04/14/2010   History reviewed. No pertinent family history. History  Substance Use Topics  . Smoking status: Current Every Day Smoker -- 0.50 packs/day  . Smokeless tobacco: Not on file  . Alcohol Use: No    Review of Systems  Neurological: Positive for weakness.  All other systems reviewed and are negative.     Allergies  Doxycycline; Atorvastatin; and Sulfa drugs cross reactors  Home Medications   Prior to Admission medications   Medication Sig Start Date End Date Taking? Authorizing Provider  acetaminophen (TYLENOL) 500 MG tablet Take 250 mg by mouth daily as needed (headache).    Yes Historical Provider, MD  albuterol (PROVENTIL HFA;VENTOLIN HFA) 108 (90 BASE) MCG/ACT inhaler Inhale 2 puffs into the lungs every 6 (six) hours as needed for wheezing or shortness of breath. 09/15/13  Yes Peter M Martinique, MD  amiodarone (PACERONE) 200 MG tablet Take 200 mg by mouth daily.   Yes Historical Provider, MD  amLODipine (NORVASC) 10 MG tablet Take 1 tablet (10 mg total) by mouth daily. 09/01/13  Yes Peter M Martinique, MD  aspirin EC 81 MG tablet Take 81 mg by mouth as needed (chest pain).   Yes Historical Provider, MD  clopidogrel (PLAVIX) 75 MG tablet Take 1 tablet (75 mg total) by mouth daily. 09/01/13  Yes Peter M Martinique, MD  ezetimibe (ZETIA) 10 MG  tablet Take 10 mg by mouth daily.   Yes Historical Provider, MD  fexofenadine (ALLEGRA) 180 MG tablet Take 180 mg by mouth daily as needed (allergies).    Yes Historical Provider, MD  iron polysaccharides (NU-IRON) 150 MG capsule Take 1 capsule by mouth daily. 07/18/13 07/18/14 Yes Historical Provider, MD  isosorbide mononitrate (IMDUR) 60 MG 24 hr tablet Take 60 mg by mouth 2 (two) times daily.   Yes Historical Provider, MD  pantoprazole (PROTONIX) 40 MG tablet Take 40 mg by mouth daily.   Yes Historical Provider, MD  polyethylene glycol (MIRALAX / GLYCOLAX) packet Take 17 g by mouth 3  (three) times daily.   Yes Historical Provider, MD  losartan (COZAAR) 100 MG tablet Take 100 mg by mouth daily.    Historical Provider, MD  nitroGLYCERIN (NITROSTAT) 0.4 MG SL tablet Place 0.4 mg under the tongue every 5 (five) minutes as needed for chest pain (Chest pain).     Historical Provider, MD   BP 147/60  Pulse 60  Temp(Src) 98 F (36.7 C) (Oral)  SpO2 99% Physical Exam  Nursing note and vitals reviewed. Constitutional: He is oriented to person, place, and time. He appears well-developed and well-nourished.  Non-toxic appearance. No distress.  HENT:  Head: Normocephalic and atraumatic.  Eyes: Conjunctivae, EOM and lids are normal. Pupils are equal, round, and reactive to light.  Neck: Normal range of motion. Neck supple. No tracheal deviation present. No mass present.  Cardiovascular: Normal rate, regular rhythm and normal heart sounds.  Exam reveals no gallop.   No murmur heard. Pulmonary/Chest: Effort normal and breath sounds normal. No stridor. No respiratory distress. He has no decreased breath sounds. He has no wheezes. He has no rhonchi. He has no rales.  Abdominal: Soft. Normal appearance and bowel sounds are normal. He exhibits no distension. There is no tenderness. There is no rebound and no CVA tenderness.  Musculoskeletal: Normal range of motion. He exhibits no edema and no tenderness.  Neurological: He is alert and oriented to person, place, and time. He has normal strength. No cranial nerve deficit or sensory deficit. GCS eye subscore is 4. GCS verbal subscore is 5. GCS motor subscore is 6.  Skin: Skin is warm and dry. No abrasion and no rash noted.  Psychiatric: He has a normal mood and affect. His speech is normal and behavior is normal.    ED Course  Procedures (including critical care time) Labs Review Labs Reviewed  CBC - Abnormal; Notable for the following:    Hemoglobin 10.1 (*)    HCT 31.9 (*)    MCV 73.2 (*)    MCH 23.2 (*)    RDW 16.8 (*)    All  other components within normal limits  URINE CULTURE  PROTIME-INR  BASIC METABOLIC PANEL  PRO B NATRIURETIC PEPTIDE  TROPONIN I  URINALYSIS, ROUTINE W REFLEX MICROSCOPIC  CBG MONITORING, ED    Imaging Review No results found.   EKG Interpretation   Date/Time:  Friday November 10 2013 14:53:28 EDT Ventricular Rate:  55 PR Interval:  196 QRS Duration: 141 QT Interval:  503 QTC Calculation: 481 R Axis:   -101 Text Interpretation:  Sinus rhythm Nonspecific IVCD with LAD Baseline  wander in lead(s) V6 No significant change since last tracing Confirmed by  Winthrop Shannahan  MD, Keiosha Cancro ([id number]) on 11/10/2013 3:09:53 PM      MDM   Final diagnoses:  None    Patient will be admitted for evaluation of weakness and likely  have a blood transfusion    Leota Jacobsen, MD 11/10/13 [phone]

## 2013-11-10 NOTE — ED Notes (Signed)
Attempted to call report to [address]. RN unable to take report at this time. Will call back.

## 2013-11-11 DIAGNOSIS — D509 Iron deficiency anemia, unspecified: Secondary | ICD-10-CM | POA: Diagnosis present

## 2013-11-11 DIAGNOSIS — I951 Orthostatic hypotension: Secondary | ICD-10-CM | POA: Diagnosis present

## 2013-11-11 LAB — CBC
HCT: 29 % — ABNORMAL LOW (ref 39.0–52.0)
Hemoglobin: 8.9 g/dL — ABNORMAL LOW (ref 13.0–17.0)
MCH: 22.8 pg — AB (ref 26.0–34.0)
MCHC: 30.7 g/dL (ref 30.0–36.0)
MCV: 74.4 fL — ABNORMAL LOW (ref 78.0–100.0)
PLATELETS: 249 10*3/uL (ref 150–400)
RBC: 3.9 MIL/uL — ABNORMAL LOW (ref 4.22–5.81)
RDW: 17 % — ABNORMAL HIGH (ref 11.5–15.5)
WBC: 6.2 10*3/uL (ref 4.0–10.5)

## 2013-11-11 LAB — IRON AND TIBC
Iron: 11 ug/dL — ABNORMAL LOW (ref 42–135)
Saturation Ratios: 3 % — ABNORMAL LOW (ref 20–55)
TIBC: 412 ug/dL (ref 215–435)
UIBC: 401 ug/dL — ABNORMAL HIGH (ref 125–400)

## 2013-11-11 LAB — FOLATE: Folate: 9.4 ng/mL

## 2013-11-11 LAB — BASIC METABOLIC PANEL
ANION GAP: 11 (ref 5–15)
BUN: 6 mg/dL (ref 6–23)
CALCIUM: 9.2 mg/dL (ref 8.4–10.5)
CHLORIDE: 102 meq/L (ref 96–112)
CO2: 24 mEq/L (ref 19–32)
Creatinine, Ser: 0.75 mg/dL (ref 0.50–1.35)
GFR calc non Af Amer: 86 mL/min — ABNORMAL LOW (ref >90)
Glucose, Bld: 114 mg/dL — ABNORMAL HIGH (ref 70–99)
Potassium: 3.5 mEq/L — ABNORMAL LOW (ref 3.7–5.3)
Sodium: 137 mEq/L (ref 137–147)

## 2013-11-11 LAB — TROPONIN I
Troponin I: <0.3 ng/mL (ref <0.30)
Troponin I: <0.3 ng/mL (ref <0.30)

## 2013-11-11 LAB — URINE CULTURE

## 2013-11-11 LAB — FERRITIN: Ferritin: 11 ng/mL — ABNORMAL LOW (ref 22–322)

## 2013-11-11 LAB — TSH: TSH: 1.22 u[IU]/mL (ref 0.350–4.500)

## 2013-11-11 LAB — VITAMIN B12: VITAMIN B 12: 384 pg/mL (ref 211–911)

## 2013-11-11 MED ORDER — ACETAMINOPHEN 500 MG PO TABS: 250.0000 mg | ORAL_TABLET | Freq: Every day | ORAL | Status: DC | PRN

## 2013-11-11 MED ORDER — PANTOPRAZOLE SODIUM 40 MG PO TBEC
40.0000 mg | DELAYED_RELEASE_TABLET | Freq: Two times a day (BID) | ORAL | Status: DC
  Administered 2013-11-11 – 2013-11-13 (×4): 40 mg via ORAL
  Filled 2013-11-11 (×7): qty 1

## 2013-11-11 MED ORDER — CHLORHEXIDINE GLUCONATE 0.12 % MT SOLN
15.0000 mL | Freq: Two times a day (BID) | OROMUCOSAL | Status: DC
  Administered 2013-11-11 – 2013-11-13 (×4): 15 mL via OROMUCOSAL
  Filled 2013-11-11 (×7): qty 15

## 2013-11-11 MED ORDER — LEVALBUTEROL HCL 0.63 MG/3ML IN NEBU
0.6300 mg | INHALATION_SOLUTION | Freq: Three times a day (TID) | RESPIRATORY_TRACT | Status: DC
  Administered 2013-11-11 – 2013-11-13 (×8): 0.63 mg via RESPIRATORY_TRACT
  Filled 2013-11-11 (×14): qty 3

## 2013-11-11 MED ORDER — CETYLPYRIDINIUM CHLORIDE 0.05 % MT LIQD
7.0000 mL | Freq: Two times a day (BID) | OROMUCOSAL | Status: DC
  Administered 2013-11-12 – 2013-11-13 (×2): 7 mL via OROMUCOSAL

## 2013-11-11 MED ORDER — SODIUM CHLORIDE 0.9 % IV SOLN
INTRAVENOUS | Status: AC
  Administered 2013-11-11: 18:00:00 via INTRAVENOUS

## 2013-11-11 NOTE — Progress Notes (Addendum)
PROGRESS NOTE    [name] YFV:[id number] DOB: 05-27-1933 DOA: 11/10/2013 PCP: Chesley Noon, MD Primary GI: Dr. Richmond Campbell.  HPI/Brief narrative 80 year old male patient with history of CAD, prior DES to LCx, chronic total occlusion of mid RCA and distal circumflex, HTN, tobacco abuse, HLD-intolerant to statins and? Zetia, ischemic CM-EF 40-45%, SVT on amiodarone, PAD, COPD, lung cancer and chronic dizziness presented to the ED with worsening dizziness. He states that he has had dizziness for at least 3 years which is mostly in upright position and intermittently associated with vertigo and tinnitus. He claims to have had a brain scan done years ago in [place] which was told to be normal. Apparently has used meclizine without significant success. Recently had colonoscopy and EGD on the day prior to admission for? Black stools and anemia and was told to have mild inflammation in the stomach and colon & colonic polyps were removed. The day after colonoscopy, patient felt extremely dizzy in upright position and was unable to get out of bed. She denied chest pain or palpitations. No LOC or falls. He denies any further black stools. He was found to be orthostatic on day of admission.   Assessment/Plan:  1. Dizziness: Likely multifactorial. He seems to have acute on chronic dizziness. Acute dizziness may be related to orthostatic hypotension from intravascular volume depletion related to recent procedures. Reason for chronic dizziness unclear-? BPPV versus other etiology. Treat for orthostatic hypotension and monitor. PT evaluation appreciated-feel that he is unsteady and unsafe at home-to discuss with patient and family regarding 24/7 supervision or SNF. CT head without acute findings but there showed cerebella atrophy-? Findings related to cerebellopathy. Carotid Dopplers: Comment by cardiology in July-no significant carotid obstruction. 2. Orthostatic hypotension: ? Related to  intravascular volume depletion. ? Chronic. Brief gentle IV fluids. Bilateral lower extremity compression stockings. Counseled regarding techniques to minimize symptoms and he verbalized understanding. 3. Iron deficiency anemia: s/p a recent colonoscopy and EGD-details not available. Iron supplements. Follow CBCs (Hb 9/1-9.3 pta per daughter). Plavix on hold for last week due to UGIB issues. Keep on hold for now. 4. Hypertension: Controlled. May consider reducing dose of amlodipine if he has persistent orthostatic symptoms. 5. HLD: Intolerance to statins. He states that even Zetia causes shoulder pains and he takes it intermittently. 6. History of CAD, ischemic CM, LVEF 40-45%, SVT: Asymptomatic. Sinus bradycardia on monitor. Continue amiodarone and aspirin. 7. Tobacco abuse: Cessation counseled 8. COPD: Stable 9. PAD: Being evaluated by Dr. Quay Burow OP. Apparently has planned procedure for 10/28 for stents in both legs. 10. Recent GIB : ? Upper & lower. No overt bleeding currently. Change PPI to BID. Holding Plavix. DC PRN ASA.    Code Status:  Full Family Communication:  Left message for son. Discussed with daughter Ms. Ruthell Rummage on 9/19. Disposition Plan:  to be determined    Consultants:  None   Procedures:  None   Antibiotics:  None    Subjective: Dizziness is slightly better today than on admission. Denies any other complaints.   Objective: Filed Vitals:   11/11/13 0934 11/11/13 0941 11/11/13 1321 11/11/13 1403  BP: 127/58 137/53    Pulse: 49     Temp: 98.2 F (36.8 C)     TempSrc: Oral     Resp: 16     Height:      Weight:      SpO2: 93%  94% 94%    Intake/Output Summary (Last 24 hours) at 11/11/13 1615  Last data filed at 11/11/13 1500  Gross per 24 hour  Intake 1438.75 ml  Output   1800 ml  Net -361.25 ml   Filed Weights   11/10/13 1900 11/11/13 0300  Weight: 84.3 kg (185 lb 13.6 oz) 84.1 kg (185 lb 6.5 oz)     Exam:  General exam:   pleasant elderly male lying comfortably supine in bed.  Respiratory system: Clear. No increased work of breathing. Cardiovascular system: S1 & S2 heard, RRR. No JVD, murmurs, gallops, clicks or pedal edema.telemetry: Sinus bradycardia mostly in the 50s and occasionally in the 40s.  Gastrointestinal system: Abdomen is nondistended, soft and nontender. Normal bowel sounds heard. Central nervous system: Alert and oriented. No focal neurological deficits. No nystagmus.  Extremities: Symmetric 5 x 5 power.   Data Reviewed: Basic Metabolic Panel:  Recent Labs Lab 11/10/13 1447 11/11/13 0155  NA 135* 137  K 3.8 3.5*  CL 97 102  CO2 27 24  GLUCOSE 93 114*  BUN 5* 6  CREATININE 0.80 0.75  CALCIUM 10.2 9.2   Liver Function Tests: No results found for this basename: AST, ALT, ALKPHOS, BILITOT, PROT, ALBUMIN,  in the last 168 hours No results found for this basename: LIPASE, AMYLASE,  in the last 168 hours No results found for this basename: AMMONIA,  in the last 168 hours CBC:  Recent Labs Lab 11/10/13 1447 11/11/13 0155  WBC 6.3 6.2  HGB 10.1* 8.9*  HCT 31.9* 29.0*  MCV 73.2* 74.4*  PLT 296 249   Cardiac Enzymes:  Recent Labs Lab 11/10/13 1541 11/10/13 2048 11/11/13 0155 11/11/13 0800  TROPONINI <0.30 <0.30 <0.30 <0.30   BNP (last 3 results)  Recent Labs  11/10/13 1944  PROBNP 224.4   CBG:  Recent Labs Lab 11/10/13 1458  GLUCAP 92    No results found for this or any previous visit (from the past 240 hour(s)).     Studies: Dg Chest 2 View  11/10/2013   CLINICAL DATA:  Anemia. Generalize weakness. Dizziness. Hypotension. Colonoscopy yesterday. Prior history of lung cancer post left upper lobectomy. Current smoker.  EXAM: CHEST  2 VIEW  COMPARISON:  Portable chest x-ray 06/13/2013, 08/18/2012. CT chest 12/27/2012, 12/16/2010, 12/10/2009.  FINDINGS: Prior left upper lobectomy with postoperative pleuroparenchymal scarring, unchanged. Scar/fibrosis in the apex  of the remaining left lung, unchanged. Emphysematous changes in both lungs. Moderate central peribronchial thickening, unchanged. No new pulmonary parenchymal abnormalities. No pleural effusions. Degenerative changes and DISH involving the thoracic spine. Pectus excavatum sternal deformity.  Cardiac silhouette normal in size, unchanged. Thoracic aorta atherosclerotic, unchanged. Hilar and mediastinal contours otherwise unremarkable.  IMPRESSION: COPD/emphysema. Prior left upper lobectomy with postsurgical pleuroparenchymal scarring. Scar/fibrosis in the apex of the remaining left lung. No acute cardiopulmonary disease.   Electronically Signed   By: Evangeline Dakin M.D.   On: 11/10/2013 16:11   Ct Head Wo Contrast  11/10/2013   CLINICAL DATA:  Progressively worsening dizziness.  EXAM: CT HEAD WITHOUT CONTRAST  TECHNIQUE: Contiguous axial images were obtained from the base of the skull through the vertex without intravenous contrast.  COMPARISON:  CT of the head performed 08/20/2012  FINDINGS: There is no evidence of acute infarction, mass lesion, or intra- or extra-axial hemorrhage on CT.  Prominence of the ventricles and sulci reflects mild to moderate cortical volume loss. Significant cerebellar atrophy is noted, with a prominent CSF space noted at the posterior aspect of the occiput. Scattered periventricular and subcortical white matter change likely reflects small vessel ischemic  microangiopathy.  The brainstem and fourth ventricle are within normal limits. The basal ganglia are unremarkable in appearance. The cerebral hemispheres demonstrate grossly normal gray-white differentiation. No mass effect or midline shift is seen.  There is no evidence of fracture; visualized osseous structures are unremarkable in appearance. The orbits are within normal limits. The paranasal sinuses and mastoid air cells are well-aerated. No significant soft tissue abnormalities are seen.  IMPRESSION: 1. No acute intracranial  pathology seen on CT. 2. Mild to moderate cortical volume loss. Significant cerebellar atrophy again noted. Scattered small vessel ischemic microangiopathy.   Electronically Signed   By: Garald Balding M.D.   On: 11/10/2013 22:47        Scheduled Meds: . amiodarone  200 mg Oral Daily  . amLODipine  5 mg Oral Daily  . antiseptic oral rinse  7 mL Mouth Rinse q12n4p  . azithromycin  500 mg Oral Daily  . chlorhexidine  15 mL Mouth Rinse BID  . iron polysaccharides  150 mg Oral Daily  . isosorbide mononitrate  60 mg Oral QPM  . levalbuterol  0.63 mg Nebulization TID  . loratadine  10 mg Oral Daily  . losartan  100 mg Oral Daily  . pantoprazole  40 mg Oral Daily  . polyethylene glycol  17 g Oral TID   Continuous Infusions: . sodium chloride 75 mL/hr at 11/11/13 0715    Principal Problem:   Dizziness Active Problems:   HTN (hypertension)   SVT (supraventricular tachycardia)   Cardiomyopathy, ischemic   COPD (chronic obstructive pulmonary disease)   Orthostatic hypotension   Anemia, iron deficiency    Time spent: 13 minutes     Budd Freiermuth, MD, FACP, FHM. [facility] Pager [phone]  If 7PM-7AM, please contact night-coverage [email or web address] Password [email or web address] 11/11/2013, 4:15 PM    LOS: 1 day

## 2013-11-11 NOTE — Evaluation (Signed)
Physical Therapy Evaluation Patient Details Name: [name] MRN: [id number] DOB: 1933/04/15 Today's Date: 11/11/2013   History of Present Illness  pt reports increased dizziness over last 2 weeks and inability to get out of bed yesterday. One day prior he reported he had a colonoscopy as well.   Clinical Impression  Orthostatics performed and did not see a drop or movement significant enough to produce symptoms pt was having. See in vitals for recorded findings. Pt does report legs feeling weak after 20 feet or so up on his feet and this has been occuring for a while.  Difficult to get clear understanding if dizziness history due to pt frustrated we are all asking similar questions and not helping the "dizziness" go away. Explained the complexity of dizziness with multi possibilities of origin. Assessed for possible BPPV with no nystagmus noted during any of th positional changes, however pt very positive with symptoms during any movement change (rolling, side lying to sit, sit to stand etc. ) Saccades, smooth pursuits and eye movement neg for nystagmus and symptoms.   Visible with pt swaying in sitting, standing and walking that he is dizzy and it does not "go away" like BBPV would after steady in one position. Pt does report that one time he was prescribed medication (meclyzine) for dizziness , but does not take it.   Pt with visible and reportable symptoms of sinning, dizziness al all time and exacerbated by any movement. Wold benefit from continued PT from acute care and after discharge to help with further assessment, safety, accomodation exercises etc, in order to get pt to independent level safe with no falls.     Follow Up Recommendations Home health PT;Supervision/Assistance - 24 hour (realize pt wants to go home,a dn needs to be followed by HHPT for balance/vestibular issues. Still feel he will be unsteady and unsafe at home, recommend 24 hr supervision/PRN assist)    Equipment  Recommendations  None recommended by PT (recommend pt use his RW as much as possible)    Recommendations for Other Services       Precautions / Restrictions Precautions Precautions: Fall Precaution Comments: fall risk due to constant dizzinesss which is exacerbated by positional changes, however never really goes completely away. Educated on safety precautions to put in place for this.. use RW, once change positions stay in that position until symptoms decrease , fix focus on one place while moving for accomadation exercises.       Mobility  Bed Mobility Overal bed mobility: Modified Independent             General bed mobility comments: "wobbly sitting EOB due to dizziness  Transfers Overall transfer level: Needs assistance Equipment used: Rolling walker (2 wheeled) Transfers: Sit to/from Stand Sit to Stand: Min guard         General transfer comment: min guard to steady and make sure dizziness did nto make him fall.   Ambulation/Gait Ambulation/Gait assistance: Min assist Ambulation Distance (Feet): 125 Feet Assistive device: Rolling walker (2 wheeled) Gait Pattern/deviations: Step-to pattern;Narrow base of support     General Gait Details: pt performed straight patterned walign with some "weaving" with RW and unpredicatable. Could tell he was dizziness even with forward fixed gauze, however got worse with turning head or looking down and then up. Educated to always have fixed gaze when up on his feet to minimize dizziness as much as possible.   Stairs            Wheelchair  Mobility    Modified Rankin (Stroke Patients Only)       Balance                                             Pertinent Vitals/Pain Pain Assessment: No/denies pain    Home Living Family/patient expects to be discharged to:: Private residence Living Arrangements: Alone (grandson stays with him at night (most the time) ) Available Help at Discharge: Family  (family lives next door) Type of Home: House Home Access: Stairs to enter Entrance Stairs-Rails: Right Entrance Stairs-Number of Steps: 6 to 11  (front entrance only has 2 but they do not use that entrnace. ) Home Layout: Two level Home Equipment: Walker - 2 wheels;Cane - single point      Prior Function Level of Independence: Independent         Comments: Pt states he is independent and realizes the RW helps to keep him from following due to dizziness however states it will not "fit" or work in his house. He uses the walls witht eh outsides of his elbows to steady himself as evident by the large bruises/discolorations on the lateral aspects fo B elbows.      Hand Dominance        Extremity/Trunk Assessment               Lower Extremity Assessment: Overall WFL for tasks assessed         Communication   Communication: No difficulties;HOH  Cognition Arousal/Alertness: Awake/alert Behavior During Therapy: WFL for tasks assessed/performed Overall Cognitive Status: Within Functional Limits for tasks assessed                      General Comments      Exercises Other Exercises Other Exercises: ** performed vestibular assessment as well, see in clinical impression write up in note.       Assessment/Plan    PT Assessment Patient needs continued PT services  PT Diagnosis Abnormality of gait   PT Problem List Decreased activity tolerance;Decreased balance;Decreased mobility  PT Treatment Interventions DME instruction;Gait training;Functional mobility training;Therapeutic activities;Therapeutic exercise;Patient/family education   PT Goals (Current goals can be found in the Care Plan section) Acute Rehab PT Goals Patient Stated Goal: I want to get better and I want to go home ASAP PT Goal Formulation: With patient Time For Goal Achievement: 11/25/13 Potential to Achieve Goals: Fair    Frequency Min 3X/week   Barriers to discharge         Co-evaluation               End of Session Equipment Utilized During Treatment: Gait belt Activity Tolerance: Patient tolerated treatment well Patient left: in chair;with call bell/phone within reach;with family/visitor present Nurse Communication: Mobility status         Time: 1245-1320 PT Time Calculation (min): 35 min   Charges:   PT Evaluation $Initial PT Evaluation Tier I: 1 Procedure PT Treatments $Gait Training: 8-22 mins $Therapeutic Activity: 8-22 mins   PT G CodesClide Dales 11/11/2013, 2:19 PM Clide Dales, PT Pager: [phone] 11/11/2013

## 2013-11-12 DIAGNOSIS — D509 Iron deficiency anemia, unspecified: Secondary | ICD-10-CM

## 2013-11-12 DIAGNOSIS — R42 Dizziness and giddiness: Secondary | ICD-10-CM | POA: Diagnosis not present

## 2013-11-12 DIAGNOSIS — I10 Essential (primary) hypertension: Secondary | ICD-10-CM

## 2013-11-12 DIAGNOSIS — I951 Orthostatic hypotension: Secondary | ICD-10-CM

## 2013-11-12 LAB — URINALYSIS, ROUTINE W REFLEX MICROSCOPIC
Bilirubin Urine: NEGATIVE
Glucose, UA: NEGATIVE mg/dL
Hgb urine dipstick: NEGATIVE
Ketones, ur: NEGATIVE mg/dL
LEUKOCYTES UA: NEGATIVE
NITRITE: NEGATIVE
PH: 7 (ref 5.0–8.0)
Protein, ur: NEGATIVE mg/dL
Specific Gravity, Urine: 1.009 (ref 1.005–1.030)
Urobilinogen, UA: 0.2 mg/dL (ref 0.0–1.0)

## 2013-11-12 LAB — BASIC METABOLIC PANEL
ANION GAP: 11 (ref 5–15)
BUN: 8 mg/dL (ref 6–23)
CALCIUM: 9.2 mg/dL (ref 8.4–10.5)
CHLORIDE: 101 meq/L (ref 96–112)
CO2: 24 meq/L (ref 19–32)
Creatinine, Ser: 0.82 mg/dL (ref 0.50–1.35)
GFR calc Af Amer: >90 mL/min (ref >90)
GFR calc non Af Amer: 83 mL/min — ABNORMAL LOW (ref >90)
GLUCOSE: 100 mg/dL — AB (ref 70–99)
POTASSIUM: 3.8 meq/L (ref 3.7–5.3)
SODIUM: 136 meq/L — AB (ref 137–147)

## 2013-11-12 LAB — CBC
HCT: 27.2 % — ABNORMAL LOW (ref 39.0–52.0)
HEMOGLOBIN: 8.7 g/dL — AB (ref 13.0–17.0)
MCH: 23.4 pg — ABNORMAL LOW (ref 26.0–34.0)
MCHC: 32 g/dL (ref 30.0–36.0)
MCV: 73.1 fL — ABNORMAL LOW (ref 78.0–100.0)
PLATELETS: 273 10*3/uL (ref 150–400)
RBC: 3.72 MIL/uL — ABNORMAL LOW (ref 4.22–5.81)
RDW: 17.4 % — ABNORMAL HIGH (ref 11.5–15.5)
WBC: 6.1 10*3/uL (ref 4.0–10.5)

## 2013-11-12 NOTE — Progress Notes (Signed)
PROGRESS NOTE    [name] SWN:[id number] DOB: 03-04-1933 DOA: 11/10/2013 PCP: Chesley Noon, MD Primary GI: Dr. Richmond Campbell.  HPI/Brief narrative 80 year old male patient with history of CAD, prior DES to LCx, chronic total occlusion of mid RCA and distal circumflex, HTN, tobacco abuse, HLD-intolerant to statins and? Zetia, ischemic CM-EF 40-45%, SVT on amiodarone, PAD, COPD, lung cancer and chronic dizziness presented to the ED with worsening dizziness. He states that he has had dizziness for at least 3 years which is mostly in upright position and intermittently associated with vertigo and tinnitus. He claims to have had a brain scan done years ago in [place] which was told to be normal. Apparently has used meclizine without significant success. Recently had colonoscopy and EGD on the day prior to admission for? Black stools and anemia and was told to have mild inflammation in the stomach and colon & colonic polyps were removed. The day after colonoscopy, patient felt extremely dizzy in upright position and was unable to get out of bed. She denied chest pain or palpitations. No LOC or falls. He denies any further black stools. He was found to be orthostatic on day of admission.   Assessment/Plan:  1. Dizziness: Likely multifactorial. He seems to have acute on chronic dizziness. Acute dizziness may be related to orthostatic hypotension from intravascular volume depletion related to recent procedures. Reason for chronic dizziness unclear-? BPPV versus other etiology. Treat for orthostatic hypotension and monitor. PT evaluation appreciated-feel that he is unsteady and unsafe at home- discussed with patient and family regarding 24/7 supervision or SNF. CT head without acute findings but showed cerebellar atrophy-? Findings related to cerebellopathy. Carotid Dopplers: Comment by cardiology in July-no significant carotid obstruction. As per family, patient saw a neurologist 3 years ago  and was given Valium for same. Also long history of "jerking/aching movements"-sometime seen while signing checks. Recommend OP neurology and ENT consultation. 2. Orthostatic hypotension: ? Related to intravascular volume depletion. ? Chronic. Brief gentle IV fluids. Bilateral lower extremity compression stockings. Counseled regarding techniques to minimize symptoms and he verbalized understanding. Orthostatic blood pressures seem to have normalized. 3. Iron deficiency anemia: s/p a recent colonoscopy and EGD-details not available. Iron supplements. Follow CBCs (Hb 9/1-9.3 pta per daughter). Plavix on hold for last week due to UGIB issues. Keep on hold for now. Hemoglobin gradually drifting down. No overt bleeding-no BM since colonoscopy. 4. Hypertension: Controlled. May consider reducing dose of amlodipine if he has persistent orthostatic symptoms. 5. HLD: Intolerance to statins. He states that even Zetia causes shoulder pains and he takes it intermittently. 6. History of CAD, ischemic CM, LVEF 40-45%, SVT: Asymptomatic. Sinus bradycardia on monitor. Continue amiodarone and aspirin. 7. Tobacco abuse: Cessation counseled 8. COPD: Stable 9. PAD: Being evaluated by Dr. Quay Burow OP. Apparently has planned procedure for 10/28 for stents in both legs. 10. Recent GIB : ? Upper & lower. No overt bleeding currently. Change PPI to BID. Holding Plavix. DC PRN ASA.    Code Status:  Full Family Communication:  Discussed with daughter Ms. Ruthell Rummage on 9/20, at bedside. Disposition Plan:  to be determined    Consultants:  None   Procedures:  None   Antibiotics:  None    Subjective: Dizziness has improved. Patient has several chronic complaints-dizziness, shaking of head and hands, ? tinnitus bilaterally left >right. No BM since admission.  Objective: Filed Vitals:   11/12/13 0601 11/12/13 0604 11/12/13 0817 11/12/13 1356  BP:  134/55  Pulse:  53    Temp:  98.4 F (36.9 C)   98.1 F (36.7 C)  TempSrc:  Oral  Oral  Resp:  18  18  Height:      Weight: 88.9 kg (195 lb 15.8 oz)     SpO2:  95% 93% 94%    Intake/Output Summary (Last 24 hours) at 11/12/13 1405 Last data filed at 11/12/13 0100  Gross per 24 hour  Intake 1301.25 ml  Output   2250 ml  Net -948.75 ml   Filed Weights   11/10/13 1900 11/11/13 0300 11/12/13 0601  Weight: 84.3 kg (185 lb 13.6 oz) 84.1 kg (185 lb 6.5 oz) 88.9 kg (195 lb 15.8 oz)     Exam:  General exam:  pleasant elderly male lying sitting up comfortably at age of bed eating breakfast this morning. Respiratory system: Clear. No increased work of breathing. Cardiovascular system: S1 & S2 heard, RRR. No JVD, murmurs, gallops, clicks or pedal edema.telemetry: Sinus bradycardia mostly in the 50s and occasionally in the 40s.  Gastrointestinal system: Abdomen is nondistended, soft and nontender. Normal bowel sounds heard. Central nervous system: Alert and oriented. No focal neurological deficits. No nystagmus.  Extremities: Symmetric 5 x 5 power. No tremors noticed. No cogwheel rigidity.   Data Reviewed: Basic Metabolic Panel:  Recent Labs Lab 11/10/13 1447 11/11/13 0155 11/12/13 0456  NA 135* 137 136*  K 3.8 3.5* 3.8  CL 97 102 101  CO2 27 24 24   GLUCOSE 93 114* 100*  BUN 5* 6 8  CREATININE 0.80 0.75 0.82  CALCIUM 10.2 9.2 9.2   Liver Function Tests: No results found for this basename: AST, ALT, ALKPHOS, BILITOT, PROT, ALBUMIN,  in the last 168 hours No results found for this basename: LIPASE, AMYLASE,  in the last 168 hours No results found for this basename: AMMONIA,  in the last 168 hours CBC:  Recent Labs Lab 11/10/13 1447 11/11/13 0155 11/12/13 0456  WBC 6.3 6.2 6.1  HGB 10.1* 8.9* 8.7*  HCT 31.9* 29.0* 27.2*  MCV 73.2* 74.4* 73.1*  PLT 296 249 273   Cardiac Enzymes:  Recent Labs Lab 11/10/13 1541 11/10/13 2048 11/11/13 0155 11/11/13 0800  TROPONINI <0.30 <0.30 <0.30 <0.30   BNP (last 3  results)  Recent Labs  11/10/13 1944  PROBNP 224.4   CBG:  Recent Labs Lab 11/10/13 1458  GLUCAP 92    Recent Results (from the past 240 hour(s))  URINE CULTURE     Status: None   Collection Time    11/10/13  3:43 PM      Result Value Ref Range Status   Specimen Description URINE, CLEAN CATCH   Final   Special Requests NONE   Final   Culture  Setup Time     Final   Value: 11/10/2013 20:23     Performed at [place]     Final   Value: 1,000 COLONIES/ML     Performed at [facility]   Culture     Final   Value: INSIGNIFICANT GROWTH     Performed at [facility]   Report Status 11/11/2013 FINAL   Final       Studies: Dg Chest 2 View  11/10/2013   CLINICAL DATA:  Anemia. Generalize weakness. Dizziness. Hypotension. Colonoscopy yesterday. Prior history of lung cancer post left upper lobectomy. Current smoker.  EXAM: CHEST  2 VIEW  COMPARISON:  Portable chest x-ray 06/13/2013, 08/18/2012. CT chest 12/27/2012,  12/16/2010, 12/10/2009.  FINDINGS: Prior left upper lobectomy with postoperative pleuroparenchymal scarring, unchanged. Scar/fibrosis in the apex of the remaining left lung, unchanged. Emphysematous changes in both lungs. Moderate central peribronchial thickening, unchanged. No new pulmonary parenchymal abnormalities. No pleural effusions. Degenerative changes and DISH involving the thoracic spine. Pectus excavatum sternal deformity.  Cardiac silhouette normal in size, unchanged. Thoracic aorta atherosclerotic, unchanged. Hilar and mediastinal contours otherwise unremarkable.  IMPRESSION: COPD/emphysema. Prior left upper lobectomy with postsurgical pleuroparenchymal scarring. Scar/fibrosis in the apex of the remaining left lung. No acute cardiopulmonary disease.   Electronically Signed   By: Evangeline Dakin M.D.   On: 11/10/2013 16:11   Ct Head Wo Contrast  11/10/2013   CLINICAL DATA:  Progressively worsening dizziness.  EXAM: CT HEAD  WITHOUT CONTRAST  TECHNIQUE: Contiguous axial images were obtained from the base of the skull through the vertex without intravenous contrast.  COMPARISON:  CT of the head performed 08/20/2012  FINDINGS: There is no evidence of acute infarction, mass lesion, or intra- or extra-axial hemorrhage on CT.  Prominence of the ventricles and sulci reflects mild to moderate cortical volume loss. Significant cerebellar atrophy is noted, with a prominent CSF space noted at the posterior aspect of the occiput. Scattered periventricular and subcortical white matter change likely reflects small vessel ischemic microangiopathy.  The brainstem and fourth ventricle are within normal limits. The basal ganglia are unremarkable in appearance. The cerebral hemispheres demonstrate grossly normal gray-white differentiation. No mass effect or midline shift is seen.  There is no evidence of fracture; visualized osseous structures are unremarkable in appearance. The orbits are within normal limits. The paranasal sinuses and mastoid air cells are well-aerated. No significant soft tissue abnormalities are seen.  IMPRESSION: 1. No acute intracranial pathology seen on CT. 2. Mild to moderate cortical volume loss. Significant cerebellar atrophy again noted. Scattered small vessel ischemic microangiopathy.   Electronically Signed   By: Garald Balding M.D.   On: 11/10/2013 22:47        Scheduled Meds: . amiodarone  200 mg Oral Daily  . amLODipine  5 mg Oral Daily  . antiseptic oral rinse  7 mL Mouth Rinse q12n4p  . chlorhexidine  15 mL Mouth Rinse BID  . iron polysaccharides  150 mg Oral Daily  . isosorbide mononitrate  60 mg Oral QPM  . levalbuterol  0.63 mg Nebulization TID  . loratadine  10 mg Oral Daily  . losartan  100 mg Oral Daily  . pantoprazole  40 mg Oral BID AC  . polyethylene glycol  17 g Oral TID   Continuous Infusions:    Principal Problem:   Dizziness Active Problems:   HTN (hypertension)   SVT  (supraventricular tachycardia)   Cardiomyopathy, ischemic   COPD (chronic obstructive pulmonary disease)   Orthostatic hypotension   Anemia, iron deficiency    Time spent: 8 minutes     HONGALGI,ANAND, MD, FACP, FHM. [facility] Pager [phone]  If 7PM-7AM, please contact night-coverage [email or web address] Password [email or web address] 11/12/2013, 2:05 PM    LOS: 2 days

## 2013-11-12 NOTE — Progress Notes (Signed)
OT Cancellation Note  Patient Details Name: [name] MRN: [id number] DOB: 02/27/1933   Cancelled Treatment:    Reason Eval/Treat Not Completed: Other (comment).Only order for OT is OT/PT vestibular order and PT is addressing dizziness issues currently without recommendation from them for need for separate OT consult. We will keep on caseload incase OT is warranted.  Almon Register [phone] 11/12/2013, 7:36 AM

## 2013-11-13 ENCOUNTER — Telehealth: Payer: Self-pay | Admitting: Cardiology

## 2013-11-13 LAB — CBC
HEMATOCRIT: 28.1 % — AB (ref 39.0–52.0)
HEMOGLOBIN: 8.8 g/dL — AB (ref 13.0–17.0)
MCH: 23.3 pg — ABNORMAL LOW (ref 26.0–34.0)
MCHC: 31.3 g/dL (ref 30.0–36.0)
MCV: 74.3 fL — AB (ref 78.0–100.0)
Platelets: 243 10*3/uL (ref 150–400)
RBC: 3.78 MIL/uL — AB (ref 4.22–5.81)
RDW: 17.1 % — AB (ref 11.5–15.5)
WBC: 5.2 10*3/uL (ref 4.0–10.5)

## 2013-11-13 LAB — OCCULT BLOOD, POC DEVICE: Fecal Occult Bld: NEGATIVE

## 2013-11-13 MED ORDER — DOCUSATE SODIUM 100 MG PO CAPS: 100.0000 mg | ORAL_CAPSULE | Freq: Two times a day (BID) | ORAL | Status: DC

## 2013-11-13 MED ORDER — ISOSORBIDE MONONITRATE ER 60 MG PO TB24: 60.0000 mg | ORAL_TABLET | Freq: Every evening | ORAL | Status: DC

## 2013-11-13 MED ORDER — FERROUS SULFATE 325 (65 FE) MG PO TABS: 325.0000 mg | ORAL_TABLET | Freq: Three times a day (TID) | ORAL | Status: DC

## 2013-11-13 MED ORDER — AMLODIPINE BESYLATE 10 MG PO TABS: 5.0000 mg | ORAL_TABLET | Freq: Every day | ORAL | Status: DC

## 2013-11-13 MED ORDER — ASPIRIN EC 81 MG PO TBEC: 81.0000 mg | DELAYED_RELEASE_TABLET | Freq: Every day | ORAL | Status: DC

## 2013-11-13 MED ORDER — PANTOPRAZOLE SODIUM 40 MG PO TBEC: 40.0000 mg | DELAYED_RELEASE_TABLET | Freq: Two times a day (BID) | ORAL | Status: DC

## 2013-11-13 NOTE — Discharge Summary (Signed)
Physician Discharge Summary  [name] XTG:[id number] DOB: 03-20-1933 DOA: 11/10/2013  PCP: Chesley Noon, MD  Admit date: 11/10/2013 Discharge date: 11/13/2013  Time spent: Greater than 30 minutes  Recommendations for Outpatient Follow-up:  1. Dr. Anastasia Pall, PCP in 5 days with repeat labs (CBC). 2. Dr. Richmond Campbell, GI- follow up re recent endoscopy procedures, anemia & Mx. 3. Dr. Quay Burow, Cardiology- supposed to have stents in b/l lower extremities. 4. Dr. Andrey Spearman, Neurology on 11/14/13 at 9:30 AM. 5. Patient & family declined Home Health services.  Discharge Diagnoses:  Principal Problem:   Dizziness Active Problems:   HTN (hypertension)   SVT (supraventricular tachycardia)   Cardiomyopathy, ischemic   COPD (chronic obstructive pulmonary disease)   Orthostatic hypotension   Anemia, iron deficiency   Discharge Condition: Improved & Stable  Diet recommendation: Heart Healthy diet.  Filed Weights   11/11/13 0300 11/12/13 0601 11/13/13 0300  Weight: 84.1 kg (185 lb 6.5 oz) 88.9 kg (195 lb 15.8 oz) 86.5 kg (190 lb 11.2 oz)    History of present illness:  80 year old male patient with history of CAD, prior DES to LCx, chronic total occlusion of mid RCA and distal circumflex, HTN, tobacco abuse, HLD-intolerant to statins and? Zetia, ischemic CM-EF 40-45%, SVT on amiodarone, PAD, COPD, lung cancer and chronic dizziness presented to the ED with worsening dizziness. He states that he has had dizziness for at least 3 years which is mostly in upright position and intermittently associated with vertigo and tinnitus. He claims to have had a brain scan done years ago in [place] which was told to be normal. Apparently has used meclizine without significant success. Recently had colonoscopy and EGD on the day prior to admission for? Black stools and anemia and was told to have mild inflammation in the stomach and colon & colonic polyps were removed. The day  after colonoscopy, patient felt extremely dizzy in upright position and was unable to get out of bed. He denied chest pain or palpitations. No LOC or falls. He denies any further black stools. He was found to be orthostatic on day of admission.   Hospital Course:   1. Dizziness: Likely multifactorial. He seems to have acute on chronic dizziness. Acute dizziness may be related to orthostatic hypotension from intravascular volume depletion related to recent procedures. Reason for chronic dizziness unclear-? BPPV versus other etiology. Treated for orthostatic hypotension and improved. PT evaluation appreciated-feel that he is unsteady and unsafe at home- discussed with patient and family regarding 24/7 supervision or SNF- they decline SNF and state that his children live next door and can assist. CT head without acute findings but showed cerebellar atrophy-? Findings related to cerebellopathy. Carotid Dopplers: Comment by cardiology in July-no significant carotid obstruction. As per family, patient saw a neurologist 3 years ago and was given Valium for same. Also long history of "jerking/aching movements"-sometime seen while signing checks. Recommend OP neurology (? Parkinson's /Cerebellar dysfunction) and ENT consultation (r/o Vestibular etio)- has appointments for 9/22 and Fri this week respectively. 2. Orthostatic hypotension: ? Related to intravascular volume depletion. ? Chronic. Brief gentle IV fluids. Bilateral lower extremity compression stockings. Counseled regarding techniques to minimize symptoms and he verbalized understanding. Orthostatic changes resolved. 3. Iron deficiency anemia: s/p recent colonoscopy and EGD-details not available. Iron supplements. Follow CBCs (Hb 9/1-9.3 pta per daughter). Plavix on hold from last week due to UGIB issues. Keep on hold for now. Hemoglobin stable. No overt bleeding-no BM since colonoscopy.  4. Hypertension: Controlled.  5. HLD: Intolerance to statins. He  states that even Zetia causes shoulder pains and he takes it intermittently- DC'ed until OP FU. 6. History of CAD, ischemic CM, LVEF 40-45%, SVT: Asymptomatic. Sinus bradycardia on monitor- asymptomatic. Continue amiodarone. Discussed with primary Cardiologist and started low dose ASA.  7. Tobacco abuse: Cessation counseled 8. COPD: Stable 9. PAD: Being evaluated by Dr. Quay Burow OP. Apparently has planned procedure for 10/28 for stents in both legs. Discussed with Dr. [name]- OK to continue ASA for now.  10. Recent GIB : ? Upper & lower. No overt bleeding currently. Change PPI to BID. Holding Plavix.    Consultations:  None  Procedures:  None    Discharge Exam:  Complaints:  Dizziness has improved. No new complaints reported.  Filed Vitals:   11/13/13 0456 11/13/13 0750 11/13/13 1027 11/13/13 1351  BP: 132/67  121/54 128/53  Pulse: 50  51 48  Temp: 98.3 F (36.8 C)   97.7 F (36.5 C)  TempSrc: Oral   Axillary  Resp: 18   18  Height:      Weight:      SpO2: 95% 93%  99%   General exam: pleasant elderly male lying sitting up comfortably in bed eating breakfast this morning.  Respiratory system: Clear. No increased work of breathing.  Cardiovascular system: S1 & S2 heard, RRR. No JVD, murmurs, gallops, clicks or pedal edema.telemetry: Sinus bradycardia mostly in the 50s and occasionally in the 40s.  Gastrointestinal system: Abdomen is nondistended, soft and nontender. Normal bowel sounds heard.  Central nervous system: Alert and oriented. No focal neurological deficits. No nystagmus.  Extremities: Symmetric 5 x 5 power. No tremors noticed. No cogwheel rigidity.   Discharge Instructions      Discharge Instructions   Call MD for:  persistant dizziness or light-headedness    Complete by:  As directed      Diet - low sodium heart healthy    Complete by:  As directed      Increase activity slowly    Complete by:  As directed             Medication List    STOP  taking these medications       clopidogrel 75 MG tablet  Commonly known as:  PLAVIX     ezetimibe 10 MG tablet  Commonly known as:  ZETIA     NU-IRON 150 MG capsule  Generic drug:  iron polysaccharides      TAKE these medications       acetaminophen 500 MG tablet  Commonly known as:  TYLENOL  Take 250 mg by mouth daily as needed (headache).     albuterol 108 (90 BASE) MCG/ACT inhaler  Commonly known as:  PROVENTIL HFA;VENTOLIN HFA  Inhale 2 puffs into the lungs every 6 (six) hours as needed for wheezing or shortness of breath.     amiodarone 200 MG tablet  Commonly known as:  PACERONE  Take 200 mg by mouth daily.     amLODipine 10 MG tablet  Commonly known as:  NORVASC  Take 0.5 tablets (5 mg total) by mouth daily.     aspirin EC 81 MG tablet  Take 1 tablet (81 mg total) by mouth daily.     docusate sodium 100 MG capsule  Commonly known as:  COLACE  Take 1 capsule (100 mg total) by mouth 2 (two) times daily.     ferrous sulfate 325 (65 FE) MG tablet  Take 1 tablet (325  mg total) by mouth 3 (three) times daily with meals.     fexofenadine 180 MG tablet  Commonly known as:  ALLEGRA  Take 180 mg by mouth daily as needed (allergies).     isosorbide mononitrate 60 MG 24 hr tablet  Commonly known as:  IMDUR  Take 1 tablet (60 mg total) by mouth every evening.     losartan 100 MG tablet  Commonly known as:  COZAAR  Take 100 mg by mouth daily.     nitroGLYCERIN 0.4 MG SL tablet  Commonly known as:  NITROSTAT  Place 0.4 mg under the tongue every 5 (five) minutes as needed for chest pain (Chest pain).     pantoprazole 40 MG tablet  Commonly known as:  PROTONIX  Take 1 tablet (40 mg total) by mouth 2 (two) times daily before a meal.     polyethylene glycol packet  Commonly known as:  MIRALAX / GLYCOLAX  Take 17 g by mouth 3 (three) times daily.       Follow-up Information   Follow up with BADGER,MICHAEL C, MD. Schedule an appointment as soon as possible for a  visit in 5 days. (To be seen with repeat labs (CBC).)    Specialty:  Family Medicine   Contact information:   [place] [address] [phone]       Schedule an appointment as soon as possible for a visit with MEDOFF,JEFFREY R, MD. (Follow up re recent endoscopy procedures, Anemia and management)    Specialty:  Gastroenterology   Contact information:   [place] [address] [phone]       Follow up with Lorretta Harp, MD. (Keep prior appointment.)    Specialty:  Cardiology   Contact information:   [address] [place] [address] [phone]       Follow up with Andrey Spearman, MD On 11/14/2013. (at 9:30 AM.)    Specialties:  Neurology, Radiology   Contact information:   [address] [place]  [address] [phone]       The results of significant diagnostics from this hospitalization (including imaging, microbiology, ancillary and laboratory) are listed below for reference.    Significant Diagnostic Studies: Dg Chest 2 View  11/10/2013   CLINICAL DATA:  Anemia. Generalize weakness. Dizziness. Hypotension. Colonoscopy yesterday. Prior history of lung cancer post left upper lobectomy. Current smoker.  EXAM: CHEST  2 VIEW  COMPARISON:  Portable chest x-ray 06/13/2013, 08/18/2012. CT chest 12/27/2012, 12/16/2010, 12/10/2009.  FINDINGS: Prior left upper lobectomy with postoperative pleuroparenchymal scarring, unchanged. Scar/fibrosis in the apex of the remaining left lung, unchanged. Emphysematous changes in both lungs. Moderate central peribronchial thickening, unchanged. No new pulmonary parenchymal abnormalities. No pleural effusions. Degenerative changes and DISH involving the thoracic spine. Pectus excavatum sternal deformity.  Cardiac silhouette normal in size, unchanged. Thoracic aorta atherosclerotic, unchanged. Hilar and mediastinal contours otherwise unremarkable.  IMPRESSION:  COPD/emphysema. Prior left upper lobectomy with postsurgical pleuroparenchymal scarring. Scar/fibrosis in the apex of the remaining left lung. No acute cardiopulmonary disease.   Electronically Signed   By: Evangeline Dakin M.D.   On: 11/10/2013 16:11   Ct Head Wo Contrast  11/10/2013   CLINICAL DATA:  Progressively worsening dizziness.  EXAM: CT HEAD WITHOUT CONTRAST  TECHNIQUE: Contiguous axial images were obtained from the base of the skull through the vertex without intravenous contrast.  COMPARISON:  CT of the head performed 08/20/2012  FINDINGS: There is no evidence of acute infarction,  mass lesion, or intra- or extra-axial hemorrhage on CT.  Prominence of the ventricles and sulci reflects mild to moderate cortical volume loss. Significant cerebellar atrophy is noted, with a prominent CSF space noted at the posterior aspect of the occiput. Scattered periventricular and subcortical white matter change likely reflects small vessel ischemic microangiopathy.  The brainstem and fourth ventricle are within normal limits. The basal ganglia are unremarkable in appearance. The cerebral hemispheres demonstrate grossly normal gray-white differentiation. No mass effect or midline shift is seen.  There is no evidence of fracture; visualized osseous structures are unremarkable in appearance. The orbits are within normal limits. The paranasal sinuses and mastoid air cells are well-aerated. No significant soft tissue abnormalities are seen.  IMPRESSION: 1. No acute intracranial pathology seen on CT. 2. Mild to moderate cortical volume loss. Significant cerebellar atrophy again noted. Scattered small vessel ischemic microangiopathy.   Electronically Signed   By: Garald Balding M.D.   On: 11/10/2013 22:47    Microbiology: Recent Results (from the past 240 hour(s))  URINE CULTURE     Status: None   Collection Time    11/10/13  3:43 PM      Result Value Ref Range Status   Specimen Description URINE, CLEAN CATCH    Final   Special Requests NONE   Final   Culture  Setup Time     Final   Value: 11/10/2013 20:23     Performed at [facility]     Final   Value: 1,000 COLONIES/ML     Performed at [facility]   Culture     Final   Value: INSIGNIFICANT GROWTH     Performed at [facility]   Report Status 11/11/2013 FINAL   Final     Labs: Basic Metabolic Panel:  Recent Labs Lab 11/10/13 1447 11/11/13 0155 11/12/13 0456  NA 135* 137 136*  K 3.8 3.5* 3.8  CL 97 102 101  CO2 27 24 24   GLUCOSE 93 114* 100*  BUN 5* 6 8  CREATININE 0.80 0.75 0.82  CALCIUM 10.2 9.2 9.2   Liver Function Tests: No results found for this basename: AST, ALT, ALKPHOS, BILITOT, PROT, ALBUMIN,  in the last 168 hours No results found for this basename: LIPASE, AMYLASE,  in the last 168 hours No results found for this basename: AMMONIA,  in the last 168 hours CBC:  Recent Labs Lab 11/10/13 1447 11/11/13 0155 11/12/13 0456 11/13/13 0433  WBC 6.3 6.2 6.1 5.2  HGB 10.1* 8.9* 8.7* 8.8*  HCT 31.9* 29.0* 27.2* 28.1*  MCV 73.2* 74.4* 73.1* 74.3*  PLT 296 249 273 243   Cardiac Enzymes:  Recent Labs Lab 11/10/13 1541 11/10/13 2048 11/11/13 0155 11/11/13 0800  TROPONINI <0.30 <0.30 <0.30 <0.30   BNP: BNP (last 3 results)  Recent Labs  11/10/13 1944  PROBNP 224.4   CBG:  Recent Labs Lab 11/10/13 1458  GLUCAP 92     Signed:  Irene Mitcham, MD, FACP, FHM. [facility] Pager [phone]  If 7PM-7AM, please contact night-coverage [email or web address] Password [email or web address] 11/13/2013, 1:53 PM

## 2013-11-13 NOTE — Progress Notes (Signed)
Physical Therapy Treatment Patient Details Name: [name] MRN: [id number] DOB: [birth year] Today's Date: 11/13/2013    History of Present Illness Pt is a 80 year old male with increased dizziness over last 2 weeks and inability getting out of bed day of admission. Pt had colonoscopy just prior to dizziness.    PT Comments    Pt ambulated in hallway with RW, appears improved since yesterday however pt reports no improvement in dizziness.  Discussed d/c plans with pt and daughter, pt only wishes to d/c home and does not want HHPT at this time.  Strongly encouraged using RW in home (however pt resistant to idea stating it won't fit and he can use walls) and HHPT for f/u however pt reports aware of recommendations but will do as he sees fit.  Daughter lives next door and reports she will check in on pt.   Follow Up Recommendations  Home health PT;Supervision/Assistance - 24 hour     Equipment Recommendations  None recommended by PT    Recommendations for Other Services       Precautions / Restrictions Precautions Precautions: Fall    Mobility  Bed Mobility               General bed mobility comments: pt up in recliner upon arrival  Transfers Overall transfer level: Needs assistance Equipment used: Rolling walker (2 wheeled) Transfers: Sit to/from Stand Sit to Stand: Min guard         General transfer comment: min/guard for safety  Ambulation/Gait Ambulation/Gait assistance: Min guard Ambulation Distance (Feet): 180 Feet Assistive device: Rolling walker (2 wheeled) Gait Pattern/deviations: Step-through pattern;Decreased stride length;Trunk flexed Gait velocity: decr   General Gait Details: pt reports dizziness not much better then yesterday however would not rate (by number or min/mod/max), no weaving observed today however verbal cues for RW positioning   Stairs            Wheelchair Mobility    Modified Rankin (Stroke Patients Only)        Balance                                    Cognition Arousal/Alertness: Awake/alert Behavior During Therapy: WFL for tasks assessed/performed Overall Cognitive Status: Within Functional Limits for tasks assessed                      Exercises      General Comments        Pertinent Vitals/Pain Pain Assessment: No/denies pain    Home Living                      Prior Function            PT Goals (current goals can now be found in the care plan section) Progress towards PT goals: Progressing toward goals    Frequency  Min 3X/week    PT Plan Current plan remains appropriate    Co-evaluation             End of Session Equipment Utilized During Treatment: Gait belt Activity Tolerance: Patient tolerated treatment well Patient left: in chair;with call bell/phone within reach;with family/visitor present     Time: 3295-1884 PT Time Calculation (min): 9 min  Charges:  $Gait Training: 8-22 mins  G CodesTrena Platt 11/18/2013, 10:12 AM [name], PT, DPT November 18, 2013 Pager: [phone]

## 2013-11-13 NOTE — Progress Notes (Signed)
Spoke with pt and daughter at bedside concerning discharge plan and home health. Pt states that he will not need HHPT or HHRN at present time. Encouraged pt and daughter once at home to call PCP if there is a need for [facility].

## 2013-11-13 NOTE — Telephone Encounter (Signed)
Returned call to patient's daughter [name].She stated father is presently in hospital and is waiting to be discharged.Stated she wanted to know when father was started on Losartan.Stated he has not been taking.Advised 08/30/13 office visit with Dr.Jordan he was taking Losartan.Stated she wanted to go ahead and schedule his post hospital appointment.Appointment scheduled with [name] NP 11/28/13 at 2:30 pm at our [facility] office.

## 2013-11-13 NOTE — Telephone Encounter (Signed)
New problem    Pt's daughter need clarification if pt is suppose to be taking Losartan. Pt is currently in the hospital and refused to take it.  Please advise

## 2013-11-14 ENCOUNTER — Ambulatory Visit (INDEPENDENT_AMBULATORY_CARE_PROVIDER_SITE_OTHER): Payer: Medicare Other | Admitting: Diagnostic Neuroimaging

## 2013-11-14 ENCOUNTER — Encounter: Payer: Self-pay | Admitting: Diagnostic Neuroimaging

## 2013-11-14 VITALS — Ht 75.0 in | Wt 189.4 lb

## 2013-11-14 DIAGNOSIS — R42 Dizziness and giddiness: Secondary | ICD-10-CM

## 2013-11-14 DIAGNOSIS — M5416 Radiculopathy, lumbar region: Secondary | ICD-10-CM

## 2013-11-14 DIAGNOSIS — G20A1 Parkinson's disease without dyskinesia, without mention of fluctuations: Secondary | ICD-10-CM

## 2013-11-14 DIAGNOSIS — H9193 Unspecified hearing loss, bilateral: Secondary | ICD-10-CM

## 2013-11-14 DIAGNOSIS — G20C Parkinsonism, unspecified: Secondary | ICD-10-CM

## 2013-11-14 DIAGNOSIS — G20 Parkinson's disease: Secondary | ICD-10-CM

## 2013-11-14 DIAGNOSIS — IMO0002 Reserved for concepts with insufficient information to code with codable children: Secondary | ICD-10-CM

## 2013-11-14 DIAGNOSIS — H81319 Aural vertigo, unspecified ear: Secondary | ICD-10-CM

## 2013-11-14 DIAGNOSIS — R269 Unspecified abnormalities of gait and mobility: Secondary | ICD-10-CM

## 2013-11-14 DIAGNOSIS — H9190 Unspecified hearing loss, unspecified ear: Secondary | ICD-10-CM

## 2013-11-14 DIAGNOSIS — G609 Hereditary and idiopathic neuropathy, unspecified: Secondary | ICD-10-CM

## 2013-11-14 DIAGNOSIS — M216X9 Other acquired deformities of unspecified foot: Secondary | ICD-10-CM

## 2013-11-14 DIAGNOSIS — M21371 Foot drop, right foot: Secondary | ICD-10-CM

## 2013-11-14 DIAGNOSIS — I739 Peripheral vascular disease, unspecified: Secondary | ICD-10-CM

## 2013-11-14 DIAGNOSIS — M21372 Foot drop, left foot: Secondary | ICD-10-CM

## 2013-11-14 NOTE — Patient Instructions (Signed)
I will check MRI scans and lab testing.  Follow up with ENT, PCP and physical therapy.  Use a rollator walker.

## 2013-11-14 NOTE — Progress Notes (Signed)
[facility]  PATIENT: [name] DOB: [birth year]  REFERRING CLINICIAN: Hongalgi HISTORY FROM: patient  REASON FOR VISIT: new consult    HISTORICAL  CHIEF COMPLAINT:  Chief Complaint  Patient presents with  . Dizziness  . Tremors    HISTORY OF PRESENT ILLNESS:   80 year old right handed male with hypertension, heart disease, lung cancer, COPD, emphysema, here for evaluation of gait difficulty and tremors.  3 years ago patient had onset of "staggering" and unsteady gait. This has been gradual and progressive. In addition he has had intermittent episodes of spinning sensation associated with nausea and vomiting, lasting 1-2 minutes at a time. These are typically triggered by changing head position, moving from sitting to standing or standing to lying down. Patient also has had progressive hearing loss over the past 3 years.  Patient has developed tremors in his hands, especially with handwriting, using utensils, drinking from a cup. No resting tremor.  Patient also has cold sensitivity in his fingers, with color change to purple and then white if he has prolonged exposure.   REVIEW OF SYSTEMS: Full 14 system review of systems performed and notable only for  numbness weakness restless legs dizziness tremor joint pain aching muscles anemia easy bruising cough COPD blood in stool hearing loss spinning sensation.   ALLERGIES: Allergies  Allergen Reactions  . Doxycycline     arthralgias  . Atorvastatin Rash  . Sulfa Drugs Cross Reactors Rash    HOME MEDICATIONS: Outpatient Prescriptions Prior to Visit  Medication Sig Dispense Refill  . amiodarone (PACERONE) 200 MG tablet Take 200 mg by mouth daily.      Marland Kitchen amLODipine (NORVASC) 10 MG tablet Take 0.5 tablets (5 mg total) by mouth daily.      Marland Kitchen aspirin EC 81 MG tablet Take 1 tablet (81 mg total) by mouth daily.  30 tablet  0  . ferrous sulfate 325 (65 FE) MG tablet Take 1 tablet (325 mg total) by mouth 3  (three) times daily with meals.  90 tablet  0  . fexofenadine (ALLEGRA) 180 MG tablet Take 180 mg by mouth daily as needed (allergies).       . losartan (COZAAR) 100 MG tablet Take 100 mg by mouth daily.      . pantoprazole (PROTONIX) 40 MG tablet Take 1 tablet (40 mg total) by mouth 2 (two) times daily before a meal.  60 tablet  0  . nitroGLYCERIN (NITROSTAT) 0.4 MG SL tablet Place 0.4 mg under the tongue every 5 (five) minutes as needed for chest pain (Chest pain).       Marland Kitchen acetaminophen (TYLENOL) 500 MG tablet Take 250 mg by mouth daily as needed (headache).       Marland Kitchen albuterol (PROVENTIL HFA;VENTOLIN HFA) 108 (90 BASE) MCG/ACT inhaler Inhale 2 puffs into the lungs every 6 (six) hours as needed for wheezing or shortness of breath.  1 Inhaler  6  . docusate sodium (COLACE) 100 MG capsule Take 1 capsule (100 mg total) by mouth 2 (two) times daily.  60 capsule  0  . isosorbide mononitrate (IMDUR) 60 MG 24 hr tablet Take 1 tablet (60 mg total) by mouth every evening.      . polyethylene glycol (MIRALAX / GLYCOLAX) packet Take 17 g by mouth 3 (three) times daily.       No facility-administered medications prior to visit.    PAST MEDICAL HISTORY: Past Medical History  Diagnosis Date  . CAD (coronary artery disease)  a. s/p DES-LCx 2006 b. repeat cath 2011- 90% distal LCx ISR, 100% mid RCA CTO, diffuse nonobstructive dz, medical management c. 07/2012 cath- 100% distal LCx ISR, 100% mid RCA CTO, nonobstructive dz; EF 40-45%  . PVC (premature ventricular contraction)   . HTN (hypertension)   . Lung cancer     non small cell  . Paroxysmal a-fib   . PAD (peripheral artery disease)     history of stenting in the past  . COPD (chronic obstructive pulmonary disease)   . LV dysfunction   . Anemia, iron deficiency     PAST SURGICAL HISTORY: Past Surgical History  Procedure Laterality Date  . Lobectomy      left upper  . Hernia repair      x5  . Appendectomy    . Back surgery    . Cervical  spine surgery    . Twenty four hour intraesophageal ph profile.  05/02/2001  . Aortogram with bilateral lower extremity runoff.  07/11/2003  . Fiberoptic bronchoscopy, mediastinum.  10/20/2005  . Left video-assisted thoracoscopic surgery, left  12/01/2005  . Esophagogastroduodenoscopy with biopsy.  04/14/2010    FAMILY HISTORY: Family History  Problem Relation Age of Onset  . Heart Problems Mother   . Other Father     Motorcycle Accident    SOCIAL HISTORY:  History   Social History  . Marital Status: Widowed    Spouse Name: N/A    Number of Children: 2  . Years of Education: 12th   Occupational History  . Retired     Nutritional therapist   Social History Main Topics  . Smoking status: Current Every Day Smoker -- 0.50 packs/day  . Smokeless tobacco: Never Used  . Alcohol Use: No     Comment: quit 40+ years ago  . Drug Use: No  . Sexual Activity: No   Other Topics Concern  . Not on file   Social History Narrative   Patient lives at home alone.   Caffeine Use: several cups of coffee daily     PHYSICAL EXAM  Filed Vitals:   11/14/13 1012  Height: 6\' 3"  (1.905 m)  Weight: 189 lb 6.4 oz (85.911 kg)    Not recorded    Body mass index is 23.67 kg/(m^2).  GENERAL EXAM: Patient is in no distress; well developed, nourished and groomed; neck is supple  CARDIOVASCULAR: Regular rate and rhythm, no murmurs, no carotid bruits  NEUROLOGIC: MENTAL STATUS: awake, alert, oriented to person, place and time, recent and remote memory intact, normal attention and concentration, language fluent, comprehension intact, naming intact, fund of knowledge appropriate CRANIAL NERVE: no papilledema on fundoscopic exam, pupils equal and reactive to light, visual fields full to confrontation, extraocular muscles intact, no nystagmus, facial sensation and strength symmetric, hearing intact, palate elevates symmetrically, uvula midline, shoulder shrug symmetric, tongue midline. MOTOR: POSTURAL  TREMOR IN BUE; NO RESTING TREMOR; MILD COGWHEELING AND BRADYKINESIA IN BUE; normal bulk and tone, full strength in the BUE; BLE (HF 5, KE 5, KF 5, LEFT DF 3, RIGHT DF 4) SENSORY: ABSENT VIB AT ANKLES AND TOES COORDINATION: finger-nose-finger, fine finger movements normal REFLEXES: BUE 1, BLE 0, DOWN GOING TOES GAIT/STATION: WIDE, SLOW, CAUTIOUS, STEPPAGE GAIT, UNSTEADY. NEEDS HELP TO STAND AND TURN.    DIAGNOSTIC DATA (LABS, IMAGING, TESTING) - I reviewed patient records, labs, notes, testing and imaging myself where available.  Lab Results  Component Value Date   WBC 5.2 11/13/2013   HGB 8.8* 11/13/2013   HCT 28.1*  11/13/2013   MCV 74.3* 11/13/2013   PLT 243 11/13/2013      Component Value Date/Time   NA 136* 11/12/2013 0456   NA 136 12/20/2012 1520   K 3.8 11/12/2013 0456   K 4.4 12/20/2012 1520   CL 101 11/12/2013 0456   CO2 24 11/12/2013 0456   CO2 26 12/20/2012 1520   GLUCOSE 100* 11/12/2013 0456   GLUCOSE 98 12/20/2012 1520   BUN 8 11/12/2013 0456   BUN 10.1 12/20/2012 1520   CREATININE 0.82 11/12/2013 0456   CREATININE 1.1 12/20/2012 1520   CALCIUM 9.2 11/12/2013 0456   CALCIUM 9.9 12/20/2012 1520   PROT 7.3 06/13/2013 1556   PROT 7.0 12/20/2012 1520   ALBUMIN 3.8 06/13/2013 1556   ALBUMIN 3.3* 12/20/2012 1520   AST 24 06/13/2013 1556   AST 13 12/20/2012 1520   ALT 14 06/13/2013 1556   ALT 10 12/20/2012 1520   ALKPHOS 66 06/13/2013 1556   ALKPHOS 67 12/20/2012 1520   BILITOT 0.2* 06/13/2013 1556   BILITOT 0.38 12/20/2012 1520   GFRNONAA 83* 11/12/2013 0456   GFRAA >90 11/12/2013 0456   Lab Results  Component Value Date   CHOL 166 06/16/2011   HDL 61.30 06/16/2011   LDLCALC 91 06/16/2011   TRIG 70.0 06/16/2011   CHOLHDL 3 06/16/2011   No results found for this basename: HGBA1C   Lab Results  Component Value Date   VITAMINB12 384 11/10/2013   Lab Results  Component Value Date   TSH 1.220 11/10/2013    11/10/13 CT head  1. No acute intracranial pathology seen on CT.  2.  Mild to moderate cortical volume loss. Significant cerebellar atrophy again noted. Scattered small vessel ischemic microangiopathy.  09/12/13 carotid u/s - mild fibrous plaque with no stenosis  09/12/13 BLE arterial u/s - right common iliac artery: 70-99% reduction - left common iliac artery: equal or greater than 50% reduction - bilateral superficial femoral artery: demonstrated occlusive disease with reconstitution within the distal segment - bilateral runoff: posterior tibial arteries occluded    ASSESSMENT AND PLAN  80 y.o. year old male here with progressive "dizziness", which consists of several symptoms: staggering gait, positional vertigo, decreased coordination. Also with neck pain, increased tone in upper ext, cogwheel rigidity, bradykinesia, low back pain, bilateral foot drop, peripheral arterial disease. No clear unifying diagnosis. I suspect that several factors are contributing.   Ddx: parkinsonism (neurodegenerative, vascular), central vestibulopathy (tumor, inflammation, vascular), cervical myelopathy, lumbar radiculopathies (L5), peripheral neuropathy  PLAN: - additional workup (MRIs, neuropathy labs); then may consider EMG as well - follow up with PCP, ENT, physical therapy as planned - use rollator walker  Orders Placed This Encounter  Procedures  . MR Brain W Wo Contrast  . MR Cervical Spine Wo Contrast  . MR Lumbar Spine Wo Contrast  . Neuropathy Panel   Return in about 2 months (around 01/14/2014).    Penni Bombard, MD 8/91/6945, 03:88 AM Certified in Neurology, Neurophysiology and Neuroimaging  [facility] [address], [place], [place] [address] [phone]

## 2013-11-15 ENCOUNTER — Other Ambulatory Visit: Payer: Self-pay | Admitting: Diagnostic Neuroimaging

## 2013-11-15 ENCOUNTER — Ambulatory Visit
Admission: RE | Admit: 2013-11-15 | Discharge: 2013-11-15 | Disposition: A | Discharge status: Home / Self Care | Payer: Medicare Other | Source: Ambulatory Visit | Attending: Cardiovascular Disease | Admitting: Cardiovascular Disease

## 2013-11-15 DIAGNOSIS — Z01818 Encounter for other preprocedural examination: Secondary | ICD-10-CM

## 2013-11-15 DIAGNOSIS — I739 Peripheral vascular disease, unspecified: Secondary | ICD-10-CM

## 2013-11-15 LAB — TSH: TSH: 1.879 u[IU]/mL (ref 0.350–4.500)

## 2013-11-15 LAB — RHEUMATOID FACTOR: Rhuematoid fact SerPl-aCnc: <10 IU/mL (ref <=14)

## 2013-11-15 LAB — VITAMIN B12: VITAMIN B 12: 433 pg/mL (ref 211–911)

## 2013-11-16 ENCOUNTER — Encounter (HOSPITAL_COMMUNITY): Payer: Self-pay | Admitting: Pharmacy Technician

## 2013-11-16 ENCOUNTER — Encounter: Payer: Self-pay | Admitting: *Deleted

## 2013-11-16 LAB — BASIC METABOLIC PANEL
BUN: 9 mg/dL (ref 6–23)
CHLORIDE: 96 meq/L (ref 96–112)
CO2: 27 meq/L (ref 19–32)
CREATININE: 0.85 mg/dL (ref 0.50–1.35)
Calcium: 9.7 mg/dL (ref 8.4–10.5)
GLUCOSE: 91 mg/dL (ref 70–99)
POTASSIUM: 4.6 meq/L (ref 3.5–5.3)
Sodium: 129 mEq/L — ABNORMAL LOW (ref 135–145)

## 2013-11-16 LAB — CBC
HEMATOCRIT: 31 % — AB (ref 39.0–52.0)
Hemoglobin: 9.6 g/dL — ABNORMAL LOW (ref 13.0–17.0)
MCH: 22.7 pg — ABNORMAL LOW (ref 26.0–34.0)
MCHC: 31 g/dL (ref 30.0–36.0)
MCV: 73.3 fL — AB (ref 78.0–100.0)
PLATELETS: 302 10*3/uL (ref 150–400)
RBC: 4.23 MIL/uL (ref 4.22–5.81)
RDW: 17.3 % — ABNORMAL HIGH (ref 11.5–15.5)
WBC: 5.1 10*3/uL (ref 4.0–10.5)

## 2013-11-16 LAB — SEDIMENTATION RATE: SED RATE: 36 mm/h — AB (ref 0–16)

## 2013-11-16 LAB — TSH: TSH: 2.149 u[IU]/mL (ref 0.350–4.500)

## 2013-11-16 LAB — ANA: Anti Nuclear Antibody(ANA): NEGATIVE

## 2013-11-16 LAB — PROTIME-INR
INR: 1.09 (ref <1.50)
Prothrombin Time: 14.1 seconds (ref 11.6–15.2)

## 2013-11-16 LAB — APTT: aPTT: 38 seconds — ABNORMAL HIGH (ref 24–37)

## 2013-11-16 LAB — ANGIOTENSIN CONVERTING ENZYME: Angiotensin-Converting Enzyme: 33 U/L (ref 8–52)

## 2013-11-16 LAB — VITAMIN D 25 HYDROXY (VIT D DEFICIENCY, FRACTURES): Vit D, 25-Hydroxy: 20 ng/mL — ABNORMAL LOW (ref 30–89)

## 2013-11-17 LAB — PROTEIN ELECTROPHORESIS, SERUM
ALBUMIN ELP: 55.1 % — AB (ref 55.8–66.1)
Alpha-1-Globulin: 4.6 % (ref 2.9–4.9)
Alpha-2-Globulin: 12.3 % — ABNORMAL HIGH (ref 7.1–11.8)
BETA 2: 7 % — AB (ref 3.2–6.5)
BETA GLOBULIN: 8 % — AB (ref 4.7–7.2)
GAMMA GLOBULIN: 13 % (ref 11.1–18.8)
Total Protein, Serum Electrophoresis: 6.9 g/dL (ref 6.0–8.3)

## 2013-11-20 ENCOUNTER — Encounter (HOSPITAL_COMMUNITY)
Admission: RE | Disposition: A | Discharge status: Home / Self Care | Payer: Self-pay | Source: Ambulatory Visit | Attending: Cardiovascular Disease

## 2013-11-20 ENCOUNTER — Encounter: Payer: Self-pay | Admitting: *Deleted

## 2013-11-20 ENCOUNTER — Ambulatory Visit (HOSPITAL_COMMUNITY)
Admission: RE | Admit: 2013-11-20 | Discharge: 2013-11-21 | Disposition: A | Discharge status: Home / Self Care | Payer: Medicare Other | Source: Ambulatory Visit | Attending: Cardiovascular Disease | Admitting: Cardiovascular Disease

## 2013-11-20 ENCOUNTER — Encounter (HOSPITAL_COMMUNITY): Payer: Self-pay | Admitting: General Practice

## 2013-11-20 DIAGNOSIS — I10 Essential (primary) hypertension: Secondary | ICD-10-CM | POA: Insufficient documentation

## 2013-11-20 DIAGNOSIS — T82898A Other specified complication of vascular prosthetic devices, implants and grafts, initial encounter: Secondary | ICD-10-CM | POA: Diagnosis not present

## 2013-11-20 DIAGNOSIS — J4489 Other specified chronic obstructive pulmonary disease: Secondary | ICD-10-CM | POA: Insufficient documentation

## 2013-11-20 DIAGNOSIS — Z7902 Long term (current) use of antithrombotics/antiplatelets: Secondary | ICD-10-CM | POA: Insufficient documentation

## 2013-11-20 DIAGNOSIS — Z79899 Other long term (current) drug therapy: Secondary | ICD-10-CM | POA: Diagnosis not present

## 2013-11-20 DIAGNOSIS — Z85118 Personal history of other malignant neoplasm of bronchus and lung: Secondary | ICD-10-CM | POA: Diagnosis not present

## 2013-11-20 DIAGNOSIS — Z9861 Coronary angioplasty status: Secondary | ICD-10-CM | POA: Insufficient documentation

## 2013-11-20 DIAGNOSIS — I2589 Other forms of chronic ischemic heart disease: Secondary | ICD-10-CM | POA: Diagnosis not present

## 2013-11-20 DIAGNOSIS — F17200 Nicotine dependence, unspecified, uncomplicated: Secondary | ICD-10-CM | POA: Insufficient documentation

## 2013-11-20 DIAGNOSIS — I70219 Atherosclerosis of native arteries of extremities with intermittent claudication, unspecified extremity: Secondary | ICD-10-CM | POA: Insufficient documentation

## 2013-11-20 DIAGNOSIS — R5383 Other fatigue: Secondary | ICD-10-CM

## 2013-11-20 DIAGNOSIS — Z7982 Long term (current) use of aspirin: Secondary | ICD-10-CM | POA: Diagnosis not present

## 2013-11-20 DIAGNOSIS — I739 Peripheral vascular disease, unspecified: Secondary | ICD-10-CM | POA: Diagnosis present

## 2013-11-20 DIAGNOSIS — Y831 Surgical operation with implant of artificial internal device as the cause of abnormal reaction of the patient, or of later complication, without mention of misadventure at the time of the procedure: Secondary | ICD-10-CM | POA: Insufficient documentation

## 2013-11-20 DIAGNOSIS — J449 Chronic obstructive pulmonary disease, unspecified: Secondary | ICD-10-CM | POA: Insufficient documentation

## 2013-11-20 DIAGNOSIS — D689 Coagulation defect, unspecified: Secondary | ICD-10-CM

## 2013-11-20 DIAGNOSIS — Z720 Tobacco use: Secondary | ICD-10-CM

## 2013-11-20 DIAGNOSIS — Z01818 Encounter for other preprocedural examination: Secondary | ICD-10-CM

## 2013-11-20 DIAGNOSIS — E785 Hyperlipidemia, unspecified: Secondary | ICD-10-CM | POA: Insufficient documentation

## 2013-11-20 HISTORY — DX: Personal history of other diseases of the digestive system: Z87.19

## 2013-11-20 HISTORY — DX: Unspecified osteoarthritis, unspecified site: M19.90

## 2013-11-20 HISTORY — PX: ILIAC ARTERY STENT: SHX1786

## 2013-11-20 HISTORY — DX: Personal history of peptic ulcer disease: Z87.11

## 2013-11-20 HISTORY — DX: Low back pain, unspecified: M54.50

## 2013-11-20 HISTORY — DX: Other chronic pain: G89.29

## 2013-11-20 HISTORY — DX: Low back pain: M54.5

## 2013-11-20 HISTORY — DX: Family history of other specified conditions: Z84.89

## 2013-11-20 HISTORY — DX: Acute myocardial infarction, unspecified: I21.9

## 2013-11-20 HISTORY — PX: LOWER EXTREMITY ANGIOGRAM: SHX5508

## 2013-11-20 HISTORY — DX: Pneumonia, unspecified organism: J18.9

## 2013-11-20 HISTORY — DX: Anxiety disorder, unspecified: F41.9

## 2013-11-20 HISTORY — DX: Gastro-esophageal reflux disease without esophagitis: K21.9

## 2013-11-20 HISTORY — DX: Angina pectoris, unspecified: I20.9

## 2013-11-20 HISTORY — DX: Personal history of other medical treatment: Z92.89

## 2013-11-20 LAB — POCT ACTIVATED CLOTTING TIME: Activated Clotting Time: 168 seconds

## 2013-11-20 SURGERY — ANGIOGRAM, LOWER EXTREMITY
Anesthesia: LOCAL | Laterality: Right

## 2013-11-20 MED ORDER — ONDANSETRON HCL 4 MG/2ML IJ SOLN: 4.0000 mg | Freq: Four times a day (QID) | INTRAMUSCULAR | Status: DC | PRN

## 2013-11-20 MED ORDER — AMIODARONE HCL 200 MG PO TABS
200.0000 mg | ORAL_TABLET | Freq: Every day | ORAL | Status: DC
  Administered 2013-11-20 – 2013-11-21 (×2): 200 mg via ORAL
  Filled 2013-11-20 (×2): qty 1

## 2013-11-20 MED ORDER — FENTANYL CITRATE 0.05 MG/ML IJ SOLN
INTRAMUSCULAR | Status: AC
  Filled 2013-11-20: qty 2

## 2013-11-20 MED ORDER — ASPIRIN 81 MG PO CHEW: 81.0000 mg | CHEWABLE_TABLET | ORAL | Status: DC

## 2013-11-20 MED ORDER — ASPIRIN EC 325 MG PO TBEC
325.0000 mg | DELAYED_RELEASE_TABLET | Freq: Every day | ORAL | Status: DC
  Administered 2013-11-21: 09:00:00 325 mg via ORAL
  Filled 2013-11-20: qty 1

## 2013-11-20 MED ORDER — LORATADINE 10 MG PO TABS
10.0000 mg | ORAL_TABLET | Freq: Every day | ORAL | Status: DC
Start: 2013-11-20 — End: 2013-11-21
  Administered 2013-11-20 – 2013-11-21 (×2): 10 mg via ORAL
  Filled 2013-11-20 (×3): qty 1

## 2013-11-20 MED ORDER — ALBUTEROL SULFATE (2.5 MG/3ML) 0.083% IN NEBU: 2.5000 mg | INHALATION_SOLUTION | RESPIRATORY_TRACT | Status: DC | PRN

## 2013-11-20 MED ORDER — LIDOCAINE HCL (PF) 1 % IJ SOLN
INTRAMUSCULAR | Status: AC
  Filled 2013-11-20: qty 30

## 2013-11-20 MED ORDER — POLYETHYLENE GLYCOL 3350 17 GM/SCOOP PO POWD: 1.0000 | ORAL | Status: DC

## 2013-11-20 MED ORDER — SODIUM CHLORIDE 0.9 % IV SOLN: INTRAVENOUS | Status: DC

## 2013-11-20 MED ORDER — HEPARIN (PORCINE) IN NACL 2-0.9 UNIT/ML-% IJ SOLN
INTRAMUSCULAR | Status: AC
  Filled 2013-11-20: qty 1000

## 2013-11-20 MED ORDER — LOSARTAN POTASSIUM 50 MG PO TABS
100.0000 mg | ORAL_TABLET | Freq: Every day | ORAL | Status: DC
  Administered 2013-11-20 – 2013-11-21 (×2): 100 mg via ORAL
  Filled 2013-11-20 (×2): qty 2

## 2013-11-20 MED ORDER — HEPARIN SODIUM (PORCINE) 1000 UNIT/ML IJ SOLN
INTRAMUSCULAR | Status: AC
  Filled 2013-11-20: qty 1

## 2013-11-20 MED ORDER — ASPIRIN EC 81 MG PO TBEC: 81.0000 mg | DELAYED_RELEASE_TABLET | Freq: Every day | ORAL | Status: DC

## 2013-11-20 MED ORDER — MORPHINE SULFATE 2 MG/ML IJ SOLN
2.0000 mg | INTRAMUSCULAR | Status: DC | PRN
  Filled 2013-11-20: qty 1

## 2013-11-20 MED ORDER — TEMAZEPAM 15 MG PO CAPS
15.0000 mg | ORAL_CAPSULE | Freq: Every evening | ORAL | Status: DC | PRN
  Administered 2013-11-20: 15 mg via ORAL
  Filled 2013-11-20: qty 1

## 2013-11-20 MED ORDER — LOSARTAN POTASSIUM 50 MG PO TABS: 100.0000 mg | ORAL_TABLET | Freq: Every day | ORAL | Status: DC

## 2013-11-20 MED ORDER — ASPIRIN EC 325 MG PO TBEC: 325.0000 mg | DELAYED_RELEASE_TABLET | Freq: Every day | ORAL | Status: DC

## 2013-11-20 MED ORDER — CLOPIDOGREL BISULFATE 75 MG PO TABS: 75.0000 mg | ORAL_TABLET | Freq: Every day | ORAL | Status: DC

## 2013-11-20 MED ORDER — POLYETHYLENE GLYCOL 3350 17 G PO PACK
17.0000 g | PACK | ORAL | Status: DC
  Filled 2013-11-20: qty 1

## 2013-11-20 MED ORDER — EZETIMIBE 10 MG PO TABS
10.0000 mg | ORAL_TABLET | Freq: Every day | ORAL | Status: DC
  Administered 2013-11-21: 09:00:00 10 mg via ORAL
  Filled 2013-11-20 (×3): qty 1

## 2013-11-20 MED ORDER — MIDAZOLAM HCL 2 MG/2ML IJ SOLN
INTRAMUSCULAR | Status: AC
  Filled 2013-11-20: qty 2

## 2013-11-20 MED ORDER — CLOPIDOGREL BISULFATE 75 MG PO TABS
75.0000 mg | ORAL_TABLET | Freq: Every day | ORAL | Status: DC
  Administered 2013-11-21: 75 mg via ORAL
  Filled 2013-11-20: qty 1

## 2013-11-20 MED ORDER — NITROGLYCERIN 0.4 MG SL SUBL: 0.4000 mg | SUBLINGUAL_TABLET | SUBLINGUAL | Status: DC | PRN

## 2013-11-20 MED ORDER — FERROUS SULFATE 325 (65 FE) MG PO TABS
325.0000 mg | ORAL_TABLET | Freq: Three times a day (TID) | ORAL | Status: DC
  Administered 2013-11-21: 09:00:00 325 mg via ORAL
  Filled 2013-11-20 (×6): qty 1

## 2013-11-20 MED ORDER — NITROGLYCERIN 1 MG/10 ML FOR IR/CATH LAB
INTRA_ARTERIAL | Status: AC
  Filled 2013-11-20: qty 10

## 2013-11-20 MED ORDER — SODIUM CHLORIDE 0.9 % IV SOLN: INTRAVENOUS | Status: AC

## 2013-11-20 MED ORDER — PANTOPRAZOLE SODIUM 40 MG PO TBEC
40.0000 mg | DELAYED_RELEASE_TABLET | Freq: Every day | ORAL | Status: DC
  Administered 2013-11-20 – 2013-11-21 (×2): 40 mg via ORAL
  Filled 2013-11-20 (×2): qty 1

## 2013-11-20 MED ORDER — AMLODIPINE BESYLATE 10 MG PO TABS
10.0000 mg | ORAL_TABLET | Freq: Every day | ORAL | Status: DC
  Administered 2013-11-20 – 2013-11-21 (×2): 10 mg via ORAL
  Filled 2013-11-20 (×2): qty 1

## 2013-11-20 MED ORDER — SODIUM CHLORIDE 0.9 % IJ SOLN: 3.0000 mL | INTRAMUSCULAR | Status: DC | PRN

## 2013-11-20 MED ORDER — ISOSORBIDE MONONITRATE ER 60 MG PO TB24
60.0000 mg | ORAL_TABLET | Freq: Every day | ORAL | Status: DC
  Administered 2013-11-20: 60 mg via ORAL
  Filled 2013-11-20 (×3): qty 1

## 2013-11-20 MED ORDER — ACETAMINOPHEN 325 MG PO TABS: 650.0000 mg | ORAL_TABLET | ORAL | Status: DC | PRN

## 2013-11-20 NOTE — Interval H&P Note (Signed)
History and Physical Interval Note:  11/20/2013 1:46 PM  [name]  has presented today for surgery, with the diagnosis of pad  The various methods of treatment have been discussed with the patient and family. After consideration of risks, benefits and other options for treatment, the patient has consented to  Procedure(s): LOWER EXTREMITY ANGIOGRAM (N/A) as a surgical intervention .  The patient's history has been reviewed, patient examined, no change in status, stable for surgery.  I have reviewed the patient's chart and labs.  Questions were answered to the patient's satisfaction.     Lorretta Harp

## 2013-11-20 NOTE — H&P (View-Only) (Signed)
10/25/2013 [name]   1933/06/18  [id number]  Primary Physician Chesley Noon, MD Primary Cardiologist: Lorretta Harp MD Renae Gloss   HPI:  Mr. Mcglasson is a 80 year old thin appearing recently widowed Caucasian male patient of Dr. Collier Salina Dorton's referred for peripheral vascular evaluation. 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 Medical therapy was recommended. He does have ischemic cardiac myopathy with an EF of 30-35% with ongoing tobacco abuse his other problems include history of lung cancer and COPD as well as hypertension and hyperlipidemia. He has lifestyle limiting claudication as well for which he was referred.   Current Outpatient Prescriptions  Medication Sig Dispense Refill  . acetaminophen (TYLENOL) 500 MG tablet Take 250 mg by mouth daily as needed for pain.      Marland Kitchen albuterol (PROVENTIL HFA;VENTOLIN HFA) 108 (90 BASE) MCG/ACT inhaler Inhale 2 puffs into the lungs every 6 (six) hours as needed for wheezing or shortness of breath.  1 Inhaler  6  . amiodarone (PACERONE) 200 MG tablet Take 200 mg by mouth daily.      Marland Kitchen amiodarone (PACERONE) 200 MG tablet TAKE 1 TABLET DAILY  30 tablet  6  . amLODipine (NORVASC) 10 MG tablet Take 1 tablet (10 mg total) by mouth daily.  30 tablet  5  . aspirin EC 81 MG tablet Take 81 mg by mouth as needed (chest pain).      Marland Kitchen clopidogrel (PLAVIX) 75 MG tablet Take 1 tablet (75 mg total) by mouth daily.  30 tablet  5  . ezetimibe (ZETIA) 10 MG tablet Take 10 mg by mouth daily.      . fexofenadine (ALLEGRA) 180 MG tablet Take 180 mg by mouth daily as needed (allergies).       . iron polysaccharides (NU-IRON) 150 MG capsule Take 1 capsule by mouth daily.      . isosorbide mononitrate (IMDUR) 60 MG 24 hr tablet Take 60 mg by mouth 2 (two) times daily.      Marland Kitchen losartan (COZAAR) 100 MG tablet Take 100 mg by mouth daily.      .  nitroGLYCERIN (NITROSTAT) 0.4 MG SL tablet Place 0.4 mg under the tongue every 5 (five) minutes as needed for chest pain.      . pantoprazole (PROTONIX) 40 MG tablet TAKE 1 TABLET ONCE A DAY  30 tablet  3  . polyethylene glycol (MIRALAX / GLYCOLAX) packet Take 17 g by mouth 3 (three) times daily.      Marland Kitchen PRESCRIPTION MEDICATION Place 2 drops into both eyes 2 (two) times daily as needed (irritated eyes). Eye drop samples from doctor's office       No current facility-administered medications for this visit.    Allergies  Allergen Reactions  . Doxycycline     arthralgias  . Atorvastatin Rash  . Sulfa Drugs Cross Reactors Rash    History   Social History  . Marital Status: Married    Spouse Name: N/A    Number of Children: 2  . Years of Education: N/A   Occupational History  . fabricator    Social History Main Topics  . Smoking status: Current Every Day Smoker -- 0.50 packs/day  . Smokeless tobacco: Not on file  . Alcohol Use: No  . Drug Use: No  . Sexual Activity: No   Other Topics Concern  . Not on file  Social History Narrative  . No narrative on file     Review of Systems: General: negative for chills, fever, night sweats or weight changes.  Cardiovascular: negative for chest pain, dyspnea on exertion, edema, orthopnea, palpitations, paroxysmal nocturnal dyspnea or shortness of breath Dermatological: negative for rash Respiratory: negative for cough or wheezing Urologic: negative for hematuria Abdominal: negative for nausea, vomiting, diarrhea, bright red blood per rectum, melena, or hematemesis Neurologic: negative for visual changes, syncope, or dizziness All other systems reviewed and are otherwise negative except as noted above.    Blood pressure 132/80, pulse 57, height 6\' 3"  (1.905 m), weight 189 lb (85.73 kg).  General appearance: alert and no distress Neck: no adenopathy, no JVD, supple, symmetrical, trachea midline, thyroid not enlarged, symmetric, no  tenderness/mass/nodules and bilateral carotid bruits left louder than right Lungs: clear to auscultation bilaterally Heart: regular rate and rhythm, S1, S2 normal, no murmur, click, rub or gallop Extremities: extremities normal, atraumatic, no cyanosis or edema and 2+ femoral pulses with bifemoral bruits  EKG not performed today  ASSESSMENT AND PLAN:   PAD (peripheral artery disease) History of lower extremity stenting remotely by Dr. Donnetta Hutching. He has Lescol limiting claudication with recent lower extremity Doppler studies that reveal ABIs in the 0.5 0.6 range bilaterally. He has occluded SFAs, high grade right common iliac and moderate left iliac stenosis. We will plan to perform angiography and potential intervention.      Lorretta Harp MD FACP,FACC,FAHA, [facility] 10/25/2013 4:23 PM

## 2013-11-20 NOTE — CV Procedure (Signed)
[name] is a 80 y.o. male    [id number] LOCATION:  FACILITY: [facility]  PHYSICIAN: Quay Burow, M.D. November 01, 1933   DATE OF PROCEDURE:  11/20/2013  DATE OF DISCHARGE:     PV Angiogram/Intervention    History obtained from chart review.[name] is a 80 year old thin appearing recently widowed Caucasian male patient of Dr. Collier Salina Jordan's referred for peripheral vascular evaluation. 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 Medical therapy was recommended. He does have ischemic cardiomyopathy with an EF of 30-35% with ongoing tobacco abuse his other problems include history of lung cancer and COPD as well as hypertension and hyperlipidemia. He has lifestyle limiting claudication as well for which he was referred. Lower extremity arterial Doppler studies revealed ABIs of 8.5 range with a high-frequency signal in the right common iliac artery and what appears to be occluded SFAs bilaterally. He was recently admitted with GI bleed and underwent colonoscopy and endoscopy and had polyps removed from his stomach. His hemoglobin today is 9.6. He presents for angiography and potential intervention.    PROCEDURE DESCRIPTION:   The patient was brought to the second floor [facility] cath lab in the postabsorptive state. He was premedicated with Valium 5 mg by mouth, IV Versed and fentanyl. His right groinwas prepped and shaved in usual sterile fashion. Xylocaine 1% was used for local anesthesia. A 7 French sheath was inserted into the right common femoral artery using standard Seldinger technique. A 5 French pigtail catheter was placed in the distal double aorta. Distal abdominal aortography, bilateral iliac angiography with bifemoral runoff was performed using bolus chase digital subtraction step table technique. Visipaque allergies for the entirety of the case. Retrograde aortic pressure was  monitored during the case. A 5 [place] and WAS placed across the iliac artery stenosis and a pullback gradient was performed after administration of 200 mcg of intra-arterial nitroglycerin via the SideArm sheath. A pullback gradient measured at least 35 mm of mercury.   HEMODYNAMICS:    AO SYSTOLIC/AO DIASTOLIC: 798/92   Angiographic Data:   1: Abdominal aortography-distal abdominal aortogram revealed no significant disease  2: Left lower extremity-the left SFA with totally occluded and fluoroscopically calcified over its midsegment. Reconstituted by profunda femoris collaterals in the adductor canal with 2 vessel runoff. The posterior tibial is occluded.  3: Right lower extremity-the previously placed stent in the right common iliac artery had a probably 60-70% proximal in-stent restenosis. There was moderate disease superior and inferior to the stent. He also was contrast within the media.  IMPRESSION:hemodynamically significant right common iliac artery with in-stent restenosis with occluded SFAs were calcified fluoroscopically. We will proceed with PTA and stenting using an ICast  covered stent of his right common iliac artery.  Procedure Description:the patient received 7500 her units of heparin with an ACT of 208. A total of 181 cc of Visipaque dye was administered to the patient. I predilated/measure the lesion with a 4 mm x 4 cm balloon and then stented with a 7 mm x 38 mm long ICast covered stent deployed at 8 atmospheres resulting reduction of a 75% lesion to 0% residual. The covered stent covered the entire diseased segment and did not reach the carina of the iliac bifurcation.  Final Impression: successful ICast covered restenting of the in-stent restenosis within the previously placed right common iliac artery stent. The patient does have high-grade, subtotal/total calcified SFAs  bilaterally with 2 vessel runoff. These are probably best treated with orbital rotational arthrectomy plus  or minus PTA/stenting. The patient will be hydrated overnight, discharged home in the morning. I will see him back in the office in one to 2 weeks. He will be on dual antiplatelet therapy we'll need to closely follow his hemoglobin.    Lorretta Harp MD, [facility] 11/20/2013 3:03 PM

## 2013-11-20 NOTE — Progress Notes (Signed)
Site area: right groin  Site Prior to Removal:  Level 0  Pressure Applied For 20 MINUTES    Minutes Beginning at 1724  Manual:   Yes.    Patient Status During Pull:  stable  Post Pull Groin Site:  Level 0  Post Pull Instructions Given:  Yes.    Post Pull Pulses Present:  Yes.    Dressing Applied:  Yes.    Comments:  Poor B/P CONTROL WHICH DELAYED SHEATH PULL [place] NOTIFIED

## 2013-11-21 ENCOUNTER — Other Ambulatory Visit: Payer: Self-pay | Admitting: Physician Assistant

## 2013-11-21 DIAGNOSIS — I739 Peripheral vascular disease, unspecified: Secondary | ICD-10-CM

## 2013-11-21 DIAGNOSIS — F17200 Nicotine dependence, unspecified, uncomplicated: Secondary | ICD-10-CM

## 2013-11-21 DIAGNOSIS — I10 Essential (primary) hypertension: Secondary | ICD-10-CM

## 2013-11-21 DIAGNOSIS — D509 Iron deficiency anemia, unspecified: Secondary | ICD-10-CM

## 2013-11-21 DIAGNOSIS — T82898A Other specified complication of vascular prosthetic devices, implants and grafts, initial encounter: Secondary | ICD-10-CM | POA: Diagnosis not present

## 2013-11-21 LAB — BASIC METABOLIC PANEL
Anion gap: 11 (ref 5–15)
BUN: 10 mg/dL (ref 6–23)
CO2: 25 mEq/L (ref 19–32)
CREATININE: 0.77 mg/dL (ref 0.50–1.35)
Calcium: 9.3 mg/dL (ref 8.4–10.5)
Chloride: 101 mEq/L (ref 96–112)
GFR calc Af Amer: >90 mL/min (ref >90)
GFR, EST NON AFRICAN AMERICAN: 85 mL/min — AB (ref >90)
GLUCOSE: 93 mg/dL (ref 70–99)
POTASSIUM: 4.1 meq/L (ref 3.7–5.3)
Sodium: 137 mEq/L (ref 137–147)

## 2013-11-21 LAB — CBC
HCT: 29.1 % — ABNORMAL LOW (ref 39.0–52.0)
Hemoglobin: 9.1 g/dL — ABNORMAL LOW (ref 13.0–17.0)
MCH: 23.3 pg — AB (ref 26.0–34.0)
MCHC: 31.3 g/dL (ref 30.0–36.0)
MCV: 74.4 fL — AB (ref 78.0–100.0)
Platelets: 266 10*3/uL (ref 150–400)
RBC: 3.91 MIL/uL — ABNORMAL LOW (ref 4.22–5.81)
RDW: 19.8 % — ABNORMAL HIGH (ref 11.5–15.5)
WBC: 5.3 10*3/uL (ref 4.0–10.5)

## 2013-11-21 LAB — POCT ACTIVATED CLOTTING TIME
Activated Clotting Time: 197 seconds
Activated Clotting Time: 208 seconds

## 2013-11-21 MED ORDER — ASPIRIN 325 MG PO TBEC: 325.0000 mg | DELAYED_RELEASE_TABLET | Freq: Every day | ORAL | Status: DC

## 2013-11-21 NOTE — Progress Notes (Signed)
Subjective: No complaints.  Just ?s re future plans.  Objective: Vital signs in last 24 hours: Temp:  [97.3 F (36.3 C)-98.3 F (36.8 C)] 97.9 F (36.6 C) (09/29 0600) Pulse Rate:  [47-66] 48 (09/29 0600) Resp:  [16-20] 20 (09/29 0600) BP: (104-179)/(39-73) 104/39 mmHg (09/29 0600) SpO2:  [89 %-98 %] 92 % (09/29 0600) Weight:  [189 lb (85.73 kg)-189 lb 9.5 oz (86 kg)] 189 lb 9.5 oz (86 kg) (09/29 0000)    Intake/Output from previous day: 09/28 0701 - 09/29 0700 In: 457.5 [P.O.:120; I.V.:337.5] Out: 1275 [Urine:1275] Intake/Output this shift: Total I/O In: 337.5 [I.V.:337.5] Out: 475 [Urine:475]  Medications Current Facility-Administered Medications  Medication Dose Route Frequency Provider Last Rate Last Dose  . acetaminophen (TYLENOL) tablet 650 mg  650 mg Oral Q4H PRN Lorretta Harp, MD      . albuterol (PROVENTIL) (2.5 MG/3ML) 0.083% nebulizer solution 2.5 mg  2.5 mg Nebulization Q4H PRN Lorretta Harp, MD      . amiodarone (PACERONE) tablet 200 mg  200 mg Oral Daily Lorretta Harp, MD   200 mg at 11/20/13 1609  . amLODipine (NORVASC) tablet 10 mg  10 mg Oral Daily Lorretta Harp, MD   10 mg at 11/20/13 1608  . aspirin EC tablet 325 mg  325 mg Oral Daily Lorretta Harp, MD      . clopidogrel (PLAVIX) tablet 75 mg  75 mg Oral Q breakfast Lorretta Harp, MD      . ezetimibe (ZETIA) tablet 10 mg  10 mg Oral Daily Lorretta Harp, MD      . ferrous sulfate tablet 325 mg  325 mg Oral TID WC Lorretta Harp, MD      . isosorbide mononitrate (IMDUR) 24 hr tablet 60 mg  60 mg Oral QHS Lorretta Harp, MD   60 mg at 11/20/13 2346  . loratadine (CLARITIN) tablet 10 mg  10 mg Oral Daily Lorretta Harp, MD   10 mg at 11/20/13 2346  . losartan (COZAAR) tablet 100 mg  100 mg Oral Daily Lorretta Harp, MD   100 mg at 11/20/13 1607  . morphine 2 MG/ML injection 2 mg  2 mg Intravenous Q1H PRN Lorretta Harp, MD      . nitroGLYCERIN (NITROSTAT) SL tablet 0.4 mg   0.4 mg Sublingual Q5 min PRN Lorretta Harp, MD      . ondansetron [facility]) injection 4 mg  4 mg Intravenous Q6H PRN Lorretta Harp, MD      . pantoprazole (PROTONIX) EC tablet 40 mg  40 mg Oral Daily Lorretta Harp, MD   40 mg at 11/20/13 2346  . polyethylene glycol (MIRALAX / GLYCOLAX) packet 17 g  17 g Oral QODAY Lorretta Harp, MD      . temazepam (RESTORIL) capsule 15 mg  15 mg Oral QHS PRN Jules Husbands, MD   15 mg at 11/20/13 2345    PE: General appearance: alert and no distress  Neck: no adenopathy, no JVD, supple, symmetrical, trachea midline, thyroid not enlarged, symmetric, no tenderness/mass/nodules and bilateral carotid bruits left louder than right  Lungs: clear to auscultation bilaterally  Heart: regular rate and rhythm, S1, S2 normal, no murmur, click, rub or gallop  Extremities: extremities normal, atraumatic, no cyanosis or edema and 2+ femoral pulses with bifemoral bruits  -- cath site c/d/i, no hematoma  Lab Results:   Recent Labs  11/21/13 0355  WBC  5.3  HGB 9.1*  HCT 29.1*  PLT 266   BMET  Recent Labs  11/21/13 0355  NA 137  K 4.1  CL 101  CO2 25  GLUCOSE 93  BUN 10  CREATININE 0.77  CALCIUM 9.3   Assessment/Plan   Active Problems:   Claudication  Plan:  SP PV angiography and successful ICast covered restenting of the in-stent restenosis within the previously placed right common iliac artery stent. The patient does have high-grade, subtotal/total calcified SFAs bilaterally with 2 vessel runoff. These are probably best treated with orbital rotational arthrectomy plus or minus PTA/stenting.  FU with Dr. [name] in two weeks.       LOS: 1 day    HAGER, BRYAN PA-C 11/21/2013 6:59 AM  I have seen and evaluated the patient this AM along with Tarri Fuller, PA. I agree with his findings, examination as well as impression recommendations.  No issues overnight s/p PTA of RCIA ISR -- plan is to return for staged bilateral SFA PTA.    OK for d/c.   F/u as scheduled.   Leonie Man, M.D., M.S. Interventional Cardiologist   Pager # [phone]

## 2013-11-21 NOTE — Discharge Summary (Signed)
Physician Discharge Summary    Cardiologist:  Martinique PCP: Chesley Noon, MD  Patient ID: [name] MRN: [id number] DOB/AGE: 1933-04-10 80 y.o.  Admit date: 11/20/2013 Discharge date: 11/21/2013  Admission Diagnoses:  Claudication  Discharge Diagnoses:  Active Problems:   HTN (hypertension)   PAD (peripheral artery disease)   Tobacco abuse   Claudication   Discharged Condition: stable  Hospital Course:   Mr. Lepage is a 80 year old thin appearing recently widowed Caucasian male patient of Dr. Collier Salina Jordan's referred for peripheral vascular evaluation. 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 Medical therapy was recommended. He does have ischemic cardiomyopathy with an EF of 30-35% with ongoing tobacco abuse his other problems include history of lung cancer and COPD as well as hypertension and hyperlipidemia. He has lifestyle limiting claudication as well for which he was referred. Lower extremity arterial Doppler studies revealed ABIs of 8.5 range with a high-frequency signal in the right common iliac artery and what appears to be occluded SFAs bilaterally. He was recently admitted with GI bleed and underwent colonoscopy and endoscopy and had polyps removed from his stomach. His hemoglobin today is 9.6. He presents for angiography and potential intervention.   He underwent successful ICast covered restenting of the in-stent restenosis within the previously placed right common iliac artery stent. The patient does have high-grade, subtotal/total calcified SFAs bilaterally with 2 vessel runoff. Dr. [name] feels these are probably best treated with orbital rotational arthrectomy plus or minus PTA/stenting.   He is on ASA and Plavix .   Will recheck Hgb in two days.  LEA dopplers in two weeks.  The patient was seen by Dr. Ellyn Hack who felt he was stable for DC home.   Hgb 9/21-8.8, 9/23- 9.6,  9/29- 9.1  Consults: None   Significant Diagnostic Studies:  PROCEDURE DESCRIPTION:  The patient was brought to the second floor [facility] cath lab in the postabsorptive state. He was premedicated with Valium 5 mg by mouth, IV Versed and fentanyl. His right groinwas prepped and shaved in usual sterile fashion. Xylocaine 1% was used for local anesthesia. A 7 French sheath was inserted into the right common femoral artery using standard Seldinger technique. A 5 French pigtail catheter was placed in the distal double aorta. Distal abdominal aortography, bilateral iliac angiography with bifemoral runoff was performed using bolus chase digital subtraction step table technique. Visipaque allergies for the entirety of the case. Retrograde aortic pressure was monitored during the case. A 5 [place] and WAS placed across the iliac artery stenosis and a pullback gradient was performed after administration of 200 mcg of intra-arterial nitroglycerin via the SideArm sheath. A pullback gradient measured at least 35 mm of mercury.  HEMODYNAMICS:  AO SYSTOLIC/AO DIASTOLIC: 323/55  Angiographic Data:  1: Abdominal aortography-distal abdominal aortogram revealed no significant disease  2: Left lower extremity-the left SFA with totally occluded and fluoroscopically calcified over its midsegment. Reconstituted by profunda femoris collaterals in the adductor canal with 2 vessel runoff. The posterior tibial is occluded.  3: Right lower extremity-the previously placed stent in the right common iliac artery had a probably 60-70% proximal in-stent restenosis. There was moderate disease superior and inferior to the stent. He also was contrast within the media.  IMPRESSION:hemodynamically significant right common iliac artery with in-stent restenosis with occluded SFAs were calcified fluoroscopically. We will proceed with PTA and stenting using an ICast covered  stent of his right common iliac artery.  Procedure  Description:the patient received 7500 her units of heparin with an ACT of 208. A total of 181 cc of Visipaque dye was administered to the patient. I predilated/measure the lesion with a 4 mm x 4 cm balloon and then stented with a 7 mm x 38 mm long ICast covered stent deployed at 8 atmospheres resulting reduction of a 75% lesion to 0% residual. The covered stent covered the entire diseased segment and did not reach the carina of the iliac bifurcation.  Final Impression: successful ICast covered restenting of the in-stent restenosis within the previously placed right common iliac artery stent. The patient does have high-grade, subtotal/total calcified SFAs bilaterally with 2 vessel runoff. These are probably best treated with orbital rotational arthrectomy plus or minus PTA/stenting. The patient will be hydrated overnight, discharged home in the morning. I will see him back in the office in one to 2 weeks. He will be on dual antiplatelet therapy we'll need to closely follow his hemoglobin.  Lorretta Harp MD, [facility]  11/20/2013   Treatments: See above  Discharge Exam: Blood pressure 145/53, pulse 54, temperature 96.7 F (35.9 C), temperature source Axillary, resp. rate 16, height 6\' 3"  (1.905 m), weight 189 lb 9.5 oz (86 kg), SpO2 94.00%.   Disposition: 01-Home or Self Care      Discharge Instructions   Diet - low sodium heart healthy    Complete by:  As directed      Discharge instructions    Complete by:  As directed   No lifting more than a half gallon of milk or driving for three days.     Increase activity slowly    Complete by:  As directed             Medication List         albuterol 108 (90 BASE) MCG/ACT inhaler  Commonly known as:  PROVENTIL HFA;VENTOLIN HFA  Inhale 2 puffs into the lungs every 4 (four) hours as needed for wheezing or shortness of breath.     amiodarone 200 MG tablet  Commonly known as:  PACERONE  Take 200 mg by mouth daily.     amLODipine 10 MG tablet   Commonly known as:  NORVASC  Take 10 mg by mouth daily.     aspirin 325 MG EC tablet  Take 1 tablet (325 mg total) by mouth daily.     clopidogrel 75 MG tablet  Commonly known as:  PLAVIX  Take 1 tablet by mouth daily.     ezetimibe 10 MG tablet  Commonly known as:  ZETIA  Take 10 mg by mouth daily.     ferrous sulfate 325 (65 FE) MG tablet  Take 325 mg by mouth 3 (three) times daily with meals.     fexofenadine 180 MG tablet  Commonly known as:  ALLEGRA  Take 180 mg by mouth daily as needed (allergies).     isosorbide mononitrate 60 MG 24 hr tablet  Commonly known as:  IMDUR  Take 60 mg by mouth at bedtime.     losartan 100 MG tablet  Commonly known as:  COZAAR  Take 100 mg by mouth daily.     nitroGLYCERIN 0.4 MG SL tablet  Commonly known as:  NITROSTAT  Place 0.4 mg under the tongue every 5 (five) minutes as needed for chest pain (Chest pain).     pantoprazole 40 MG tablet  Commonly known as:  PROTONIX  Take 40  mg by mouth daily.     polyethylene glycol packet  Commonly known as:  MIRALAX / GLYCOLAX  Take 17 g by mouth every other day.       Follow-up Information   Follow up with Tarri Fuller, PA-C On 12/12/2013. (11:30 AM)    Specialty:  Physician Assistant   Contact information:   [address] [place] [address] [phone]       Follow up with Lab On 11/23/2013. (Go to lab and have a CBC checked. )      Greater than 30 minutes was spent completing the patient's discharge.    SignedTarri Fuller, PAC 11/21/2013, 9:36 AM  Pt. Seen & examined on the AM of discharge.  Doing well post PV PTA.    Ready for d/c -- ROV with Dr. [name]. Agree with summary.   Leonie Man, M.D., M.S. Interventional Cardiologist   Pager # [phone]

## 2013-11-21 NOTE — Discharge Instructions (Signed)
[name]

## 2013-11-22 ENCOUNTER — Encounter (HOSPITAL_COMMUNITY): Payer: Self-pay | Admitting: *Deleted

## 2013-11-27 ENCOUNTER — Telehealth: Payer: Self-pay

## 2013-11-27 ENCOUNTER — Telehealth: Payer: Self-pay | Admitting: Cardiology

## 2013-11-27 NOTE — Telephone Encounter (Signed)
Received message patient can see Melina Copa PA tomorrow 11/28/13 for swelling in feet.Patient's daughter Janace Hoard was called, she stated she wants wait and keep appointment 11/29/13 with [name] NP.

## 2013-11-27 NOTE — Telephone Encounter (Signed)
Returned call to patient's daughter Janace Hoard she stated father's post hospital appointment 11/29/13 with [name] NP was cancelled.Stated father saw Dr.Berry 10/25/13 for pv evaluation.Stated father was complaining of swelling in both feet and was told Dr.Berry will just see him for pv and all other problems he will need to ask Dr.Jordan.Stated father had a pv procedure 11/20/13 and he is still having swelling in both feet.Stated he can hardly walk.Stated no sob.Stated she was confused and did not know why the first post hospital appt was cancelled.Stated he is scheduled for lower ext dopplers 12/05/13 and follow up with Tenny Craw PA 12/12/13.Stated he needs to be seen for swelling in both feet.Stated he can't wait until 12/12/13.Post hospital appointment re scheduled with [name] NP Wed 11/29/13 at 11:00 am.

## 2013-11-28 ENCOUNTER — Ambulatory Visit: Payer: Medicare Other | Admitting: Nurse Practitioner

## 2013-11-29 ENCOUNTER — Ambulatory Visit: Payer: Medicare Other | Admitting: Nurse Practitioner

## 2013-11-29 ENCOUNTER — Ambulatory Visit (INDEPENDENT_AMBULATORY_CARE_PROVIDER_SITE_OTHER): Payer: Medicare Other | Admitting: Nurse Practitioner

## 2013-11-29 ENCOUNTER — Encounter: Payer: Self-pay | Admitting: Nurse Practitioner

## 2013-11-29 VITALS — BP 130/70 | HR 47 | Ht 75.0 in | Wt 191.8 lb

## 2013-11-29 DIAGNOSIS — I10 Essential (primary) hypertension: Secondary | ICD-10-CM

## 2013-11-29 DIAGNOSIS — I2510 Atherosclerotic heart disease of native coronary artery without angina pectoris: Secondary | ICD-10-CM

## 2013-11-29 DIAGNOSIS — I255 Ischemic cardiomyopathy: Secondary | ICD-10-CM

## 2013-11-29 DIAGNOSIS — R0600 Dyspnea, unspecified: Secondary | ICD-10-CM

## 2013-11-29 DIAGNOSIS — I739 Peripheral vascular disease, unspecified: Secondary | ICD-10-CM

## 2013-11-29 DIAGNOSIS — Z79899 Other long term (current) drug therapy: Secondary | ICD-10-CM

## 2013-11-29 LAB — CBC
HCT: 29.4 % — ABNORMAL LOW (ref 39.0–52.0)
Hemoglobin: 9.3 g/dL — ABNORMAL LOW (ref 13.0–17.0)
MCHC: 31.6 g/dL (ref 30.0–36.0)
MCV: 79.3 fl (ref 78.0–100.0)
Platelets: 276 10*3/uL (ref 150.0–400.0)
RBC: 3.71 Mil/uL — ABNORMAL LOW (ref 4.22–5.81)
RDW: 28.6 % — ABNORMAL HIGH (ref 11.5–15.5)
WBC: 6 10*3/uL (ref 4.0–10.5)

## 2013-11-29 LAB — BASIC METABOLIC PANEL
BUN: 8 mg/dL (ref 6–23)
CO2: 26 mEq/L (ref 19–32)
Calcium: 9.7 mg/dL (ref 8.4–10.5)
Chloride: 100 mEq/L (ref 96–112)
Creatinine, Ser: 0.9 mg/dL (ref 0.4–1.5)
GFR: 90.11 mL/min (ref >60.00)
Glucose, Bld: 87 mg/dL (ref 70–99)
Potassium: 4.3 mEq/L (ref 3.5–5.1)
Sodium: 132 mEq/L — ABNORMAL LOW (ref 135–145)

## 2013-11-29 LAB — BRAIN NATRIURETIC PEPTIDE: Pro B Natriuretic peptide (BNP): 123 pg/mL — ABNORMAL HIGH (ref 0.0–100.0)

## 2013-11-29 MED ORDER — FUROSEMIDE 20 MG PO TABS: 20.0000 mg | ORAL_TABLET | Freq: Every day | ORAL | Status: DC | PRN

## 2013-11-29 MED ORDER — NITROGLYCERIN 0.4 MG SL SUBL: 0.4000 mg | SUBLINGUAL_TABLET | SUBLINGUAL | Status: DC | PRN

## 2013-11-29 MED ORDER — AMLODIPINE BESYLATE 10 MG PO TABS: 5.0000 mg | ORAL_TABLET | Freq: Every day | ORAL | Status: DC

## 2013-11-29 NOTE — Progress Notes (Signed)
[name] Date of Birth: 01-12-1934 Medical Record #[id number]  History of Present Illness: [name] is seen back today for a post hospital visit. Seen for Dr. Martinique and Dr. [name]. He is a 80 year old male with multiple issues. These include known CAD with prior DES to LCX in 2006, repeat cath in 2011 and in June of 2014. He has chronic occlusion of the mid RCA and of the distal circumflex - managed medically, ischemic CM with EF 30 to 35%, ongoing tobacco abuse, SVT - on amiodarone, COPD, history of lung cancer and HTN. Has had recent GI bleed with colonoscopy/endoscopy performed in September 2015.   He was last seen by Dr. Martinique in 2022-09-29 - his wife had passed away. He was mostly limited by poor balance and legs giving out - referred to Dr. [name] - he had seen Dr. Donnetta Hutching in the past.   Referred to Dr. [name] for PV evaluation - underwent a PV angiogram - noted to have significant right common iliac artery with in-stent restenosis - moderate disease superior and inferior to the stent - he had successful restenting of the ISR within the previously placed right common iliac artery stent. Discharged on DAPT. Also noted to have high grade, subtotal/total calcified SFAs bilaterally with 2 vessel runoff - could "probably best be treated with orbital rotational arthrectomy +- PTA/stenting. Follow up dopplers arranged along with repeat CBC.  Called the [facility] office on Monday - "Returned call to patient's daughter Janace Hoard she stated father's post hospital appointment 11/29/13 with [name] NP was cancelled.Stated father saw Dr.Berry 10/25/13 for pv evaluation.Stated father was complaining of swelling in both feet and was told Dr.Berry will just see him for pv and all other problems he will need to ask Dr.Jordan.Stated father had a pv procedure 11/20/13 and he is still having swelling in both feet.Stated he can hardly walk.Stated no sob.Stated she was confused and did not know why the first post  hospital appt was cancelled.Stated he is scheduled for lower ext dopplers 12/05/13 and follow up with Tenny Craw PA 12/12/13.Stated he needs to be seen for swelling in both feet.Stated he can't wait until 12/12/13.Post hospital appointment re scheduled with [name] NP Wed 11/29/13 at 11:00 am."  Comes in today. Here with his son in law -  [name]. [name] really does not know why he is here. Says his feet are swelling but they have been swelling for years. He does use salt - now alone and family bringing in food. Still dizzy. Weight up just a few pounds. Breathing seems stable. No NTG use. No chest pain. Upset about all of his visits that he has to go to - says he can't afford. In talking with him about his medicines - sounds like he does not take his Losartan all the time - he thought it was potassium (because it is listed as losartan potassium). BP running in the 856'D systolic at home. He probably is walking better. Taking iron 3 times a day - very constipated - no IV iron given. Sees oncology next month. For lots of scans next week to include brain MRI. He does not know if he continues to bleed. Hemoglobin on Friday was reportedly 9.6.   Current Outpatient Prescriptions  Medication Sig Dispense Refill  . albuterol (PROVENTIL HFA;VENTOLIN HFA) 108 (90 BASE) MCG/ACT inhaler Inhale 2 puffs into the lungs every 4 (four) hours as needed for wheezing or shortness of breath.      Marland Kitchen  amiodarone (PACERONE) 200 MG tablet Take 200 mg by mouth daily.      Marland Kitchen amLODipine (NORVASC) 10 MG tablet Take 10 mg by mouth daily.      Marland Kitchen aspirin EC 325 MG EC tablet Take 1 tablet (325 mg total) by mouth daily.  30 tablet  0  . clopidogrel (PLAVIX) 75 MG tablet Take 1 tablet by mouth daily.      Marland Kitchen ezetimibe (ZETIA) 10 MG tablet Take 10 mg by mouth daily.      . ferrous sulfate 325 (65 FE) MG tablet Take 325 mg by mouth 3 (three) times daily with meals.      . fexofenadine (ALLEGRA) 180 MG tablet Take 180 mg by mouth  daily as needed (allergies).       . isosorbide mononitrate (IMDUR) 60 MG 24 hr tablet Take 60 mg by mouth at bedtime.      Marland Kitchen losartan (COZAAR) 100 MG tablet Take 100 mg by mouth daily.      . nitroGLYCERIN (NITROSTAT) 0.4 MG SL tablet Place 0.4 mg under the tongue every 5 (five) minutes as needed for chest pain (Chest pain).       . pantoprazole (PROTONIX) 40 MG tablet Take 40 mg by mouth daily.      . polyethylene glycol (MIRALAX / GLYCOLAX) packet Take 17 g by mouth every other day.        No current facility-administered medications for this visit.    Allergies  Allergen Reactions  . Doxycycline     arthralgias  . Atorvastatin Rash  . Sulfa Drugs Cross Reactors Rash    Past Medical History  Diagnosis Date  . CAD (coronary artery disease)     a. s/p DES-LCx 2006 b. repeat cath 2011- 90% distal LCx ISR, 100% mid RCA CTO, diffuse nonobstructive dz, medical management c. 07/2012 cath- 100% distal LCx ISR, 100% mid RCA CTO, nonobstructive dz; EF 40-45%  . PVC (premature ventricular contraction)   . HTN (hypertension)   . Paroxysmal a-fib   . PAD (peripheral artery disease)     history of stenting in the past  . COPD (chronic obstructive pulmonary disease)   . LV dysfunction   . Anemia, iron deficiency   . Family history of anesthesia complication     "daughter gets PONV" (11/20/2013)  . Anginal pain   . Pneumonia 1-2 X's  . History of blood transfusion     "he's had several; probably related to his cancer" (11/20/2013)  . GERD (gastroesophageal reflux disease)   . H/O hiatal hernia   . History of gastric ulcer   . Arthritis     "generalized"  . Chronic lower back pain   . Anxiety   . Myocardial infarction 09/2009; ~ 2014    [name] 10/24/2009;   . Lung cancer dx'd 11/2005    non small cell    Past Surgical History  Procedure Laterality Date  . Lobectomy Left     left upper  . Appendectomy    . Anterior cervical decomp/discectomy fusion    . Twenty four hour  intraesophageal ph profile.  05/02/2001  . Aortogram with bilateral lower extremity runoff.  07/11/2003  . Fiberoptic bronchoscopy, mediastinum.  10/20/2005  . Video assisted thoracoscopy Left 12/01/2005  . Esophagogastroduodenoscopy with biopsy.  04/14/2010  . Iliac artery stent Right 11/20/2013  . Tonsillectomy  1972  . Inguinal hernia repair Bilateral     "I've had several"  . Back surgery      "I've  had several back surgeries; got spacers down in lower back" (11/20/2013)  . Cataract extraction w/ intraocular lens  implant, bilateral Bilateral   . Coronary angioplasty with stent placement  2006    2/notes 10/24/2009;     History  Smoking status  . Current Every Day Smoker -- 1.00 packs/day for 65 years  . Types: Cigarettes  Smokeless tobacco  . Never Used    History  Alcohol Use  . Yes    Comment: quit 40+ years ago    Family History  Problem Relation Age of Onset  . Heart Problems Mother   . Other Father     Motorcycle Accident    Review of Systems: The review of systems is per the HPI.  All other systems were reviewed and are negative.  Physical Exam: BP 130/70  Pulse 47  Ht 6\' 3"  (1.905 m)  Wt 191 lb 12.8 oz (87 kg)  BMI 23.97 kg/m2  SpO2 97% Patient is very pleasant and in no acute distress. Chronically ill appearing. Skin is warm and dry. Color is normal.  HEENT is unremarkable. Normocephalic/atraumatic. PERRL. Sclera are nonicteric. Neck is supple. No masses. No JVD. Lungs are coarse with scattered ronchi. Cardiac exam shows a regular rate and rhythm. Abdomen is soft. Extremities are with 1+ edema. Gait and ROM are intact. No gross neurologic deficits noted.  Wt Readings from Last 3 Encounters:  11/29/13 191 lb 12.8 oz (87 kg)  11/21/13 189 lb 9.5 oz (86 kg)  11/21/13 189 lb 9.5 oz (86 kg)    LABORATORY DATA/PROCEDURES:  Lab Results  Component Value Date   WBC 5.3 11/21/2013   HGB 9.1* 11/21/2013   HCT 29.1* 11/21/2013   PLT 266 11/21/2013   GLUCOSE 93  11/21/2013   CHOL 166 06/16/2011   TRIG 70.0 06/16/2011   HDL 61.30 06/16/2011   LDLCALC 91 06/16/2011   ALT 14 06/13/2013   AST 24 06/13/2013   NA 137 11/21/2013   K 4.1 11/21/2013   CL 101 11/21/2013   CREATININE 0.77 11/21/2013   BUN 10 11/21/2013   CO2 25 11/21/2013   TSH 2.149 11/15/2013   TSH 1.879 11/15/2013   INR 1.09 11/15/2013    BNP (last 3 results)  Recent Labs  11/10/13 1944  PROBNP 224.4   PROCEDURE DESCRIPTION:  The patient was brought to the second floor [facility] cath lab in the postabsorptive state. He was premedicated with Valium 5 mg by mouth, IV Versed and fentanyl. His right groinwas prepped and shaved in usual sterile fashion. Xylocaine 1% was used for local anesthesia. A 7 French sheath was inserted into the right common femoral artery using standard Seldinger technique. A 5 French pigtail catheter was placed in the distal double aorta. Distal abdominal aortography, bilateral iliac angiography with bifemoral runoff was performed using bolus chase digital subtraction step table technique. Visipaque allergies for the entirety of the case. Retrograde aortic pressure was monitored during the case. A 5 [place] and WAS placed across the iliac artery stenosis and a pullback gradient was performed after administration of 200 mcg of intra-arterial nitroglycerin via the SideArm sheath. A pullback gradient measured at least 35 mm of mercury.  HEMODYNAMICS:  AO SYSTOLIC/AO DIASTOLIC: 086/57  Angiographic Data:  1: Abdominal aortography-distal abdominal aortogram revealed no significant disease  2: Left lower extremity-the left SFA with totally occluded and fluoroscopically calcified over its midsegment. Reconstituted by profunda femoris collaterals in the adductor canal with 2 vessel runoff. The posterior tibial is  occluded.  3: Right lower extremity-the previously placed stent in the right common iliac artery had a probably 60-70% proximal in-stent restenosis. There was moderate  disease superior and inferior to the stent. He also was contrast within the media.  IMPRESSION:hemodynamically significant right common iliac artery with in-stent restenosis with occluded SFAs were calcified fluoroscopically. We will proceed with PTA and stenting using an ICast covered stent of his right common iliac artery.  Procedure Description:the patient received 7500 her units of heparin with an ACT of 208. A total of 181 cc of Visipaque dye was administered to the patient. I predilated/measure the lesion with a 4 mm x 4 cm balloon and then stented with a 7 mm x 38 mm long ICast covered stent deployed at 8 atmospheres resulting reduction of a 75% lesion to 0% residual. The covered stent covered the entire diseased segment and did not reach the carina of the iliac bifurcation.  Final Impression: successful ICast covered restenting of the in-stent restenosis within the previously placed right common iliac artery stent. The patient does have high-grade, subtotal/total calcified SFAs bilaterally with 2 vessel runoff. These are probably best treated with orbital rotational arthrectomy plus or minus PTA/stenting. The patient will be hydrated overnight, discharged home in the morning. I will see him back in the office in one to 2 weeks. He will be on dual antiplatelet therapy we'll need to closely follow his hemoglobin.  Lorretta Harp MD, [facility]  11/20/2013  3:03 PM  Echo Study Conclusions from July 2014  - Left ventricle: LVEF Is depressed at approximately 40 to 45% with hypokinesis of the distal lateral, distalanterior and apical walls The cavity size was mildly dilated. Wall thickness was increased in a pattern of mild LVH. Doppler parameters are consistent with abnormal left ventricular relaxation (grade 1 diastolic dysfunction). - Mitral valve: Mild regurgitation.   Assessment / Plan: 1. Post PV procedure - now on DAPT  2. Anemia with recent GI bleed - may need to consider IV iron therapy.  Rechecking his lab today. Unfortunately, now on DAPT with full dose aspirin  3. Edema - probably multifactorial - will let him use prn Lasix 20 mg daily  4. CAD - no active chest pain - refilled his NTG today  5. HTN - BP running lower - I am cutting the Norvasc back to 5 mg. He will need to continue to check his BP at home, especially since I have asked him to take his Losartan and explained that this was not a potassium supplement.   Further disposition to follow.   Patient is agreeable to this plan and will call if any problems develop in the interim.   Burtis Junes, RN, [place] [address] [place], [place]  [address] [phone]

## 2013-11-29 NOTE — Patient Instructions (Addendum)
We will be checking the following labs today BMET and CBC today  Cut the Norvasc back to 5 mg each day   I have sent in a RX for Lasix 20 mg to take daily if needed for swelling - take daily for the next few days and then just as needed  Keep monitoring your BP at home  I will refill the NTG  Keep all of your follow up visits  Call the [facility] office at [phone] if you have any questions, problems or concerns.

## 2013-11-30 ENCOUNTER — Telehealth: Payer: Self-pay | Admitting: Nurse Practitioner

## 2013-11-30 NOTE — Telephone Encounter (Signed)
Follow up:   Pt called for his lab results from 10/7

## 2013-11-30 NOTE — Telephone Encounter (Signed)
Left message to call back  

## 2013-12-01 ENCOUNTER — Other Ambulatory Visit: Payer: Self-pay | Admitting: *Deleted

## 2013-12-01 DIAGNOSIS — R609 Edema, unspecified: Secondary | ICD-10-CM

## 2013-12-01 DIAGNOSIS — E785 Hyperlipidemia, unspecified: Secondary | ICD-10-CM

## 2013-12-01 DIAGNOSIS — R0602 Shortness of breath: Secondary | ICD-10-CM

## 2013-12-01 NOTE — Telephone Encounter (Signed)
Per result notes, pt is aware of lab results.

## 2013-12-04 ENCOUNTER — Telehealth: Payer: Self-pay | Admitting: Diagnostic Neuroimaging

## 2013-12-04 NOTE — Telephone Encounter (Signed)
Please advise previous message  

## 2013-12-04 NOTE — Telephone Encounter (Signed)
Patient's daughter is calling. Patient had a lot of problems with vertigo, nausea and vomiting over the weekend. Patient took a Microbiologist but it did not help. He is still having same problems today. Please call.

## 2013-12-05 ENCOUNTER — Telehealth: Payer: Self-pay | Admitting: Cardiovascular Disease

## 2013-12-05 ENCOUNTER — Ambulatory Visit (HOSPITAL_COMMUNITY)
Admission: RE | Admit: 2013-12-05 | Discharge: 2013-12-05 | Disposition: A | Discharge status: Home / Self Care | Payer: Medicare Other | Source: Ambulatory Visit | Attending: Cardiology | Admitting: Cardiology

## 2013-12-05 DIAGNOSIS — I739 Peripheral vascular disease, unspecified: Secondary | ICD-10-CM | POA: Insufficient documentation

## 2013-12-05 DIAGNOSIS — D509 Iron deficiency anemia, unspecified: Secondary | ICD-10-CM

## 2013-12-05 LAB — CBC
HCT: 30.3 % — ABNORMAL LOW (ref 39.0–52.0)
HEMOGLOBIN: 9.7 g/dL — AB (ref 13.0–17.0)
MCH: 25.8 pg — ABNORMAL LOW (ref 26.0–34.0)
MCHC: 32 g/dL (ref 30.0–36.0)
MCV: 80.6 fL (ref 78.0–100.0)
PLATELETS: 308 10*3/uL (ref 150–400)
RBC: 3.76 MIL/uL — ABNORMAL LOW (ref 4.22–5.81)
RDW: 25.6 % — ABNORMAL HIGH (ref 11.5–15.5)
WBC: 6.4 10*3/uL (ref 4.0–10.5)

## 2013-12-05 NOTE — Telephone Encounter (Signed)
Returned call. No answer. Patient is scheduled to have MRI's on 12/06/13. Will wait on results and plan to have EMG on 12/07/13 per Dr. Leta [facility]. Could not reach daughter.

## 2013-12-05 NOTE — Addendum Note (Signed)
Addended by: Cristopher Estimable on: 12/05/2013 09:34 AM   Modules accepted: Orders

## 2013-12-05 NOTE — Telephone Encounter (Signed)
Spoke with angela, she is aware the patient is to have repeat labs at appointment 12-12-13. She is concerned because the patient is pale and weak and feels waiting another week is too long. Order for cbc placed for patient to have drawn while in the office today for vascular study.

## 2013-12-05 NOTE — Telephone Encounter (Addendum)
[name] called in stating that she feels pt's hemoglobin should be checked because last week when he was in to see [name] it was recorded at 9.1, she recommended that his levels should be watched closely. [name] also stated that [name] has been very weak and pale lately. She would like a call back from our office to let her know how he is doing.  FYI: Pt will be having a vascular test done today at 11:00am  Thanks

## 2013-12-05 NOTE — Progress Notes (Signed)
Right Lower Extremity Arterial Duplex Completed. °Brianna L Mazza,RVT °

## 2013-12-06 ENCOUNTER — Ambulatory Visit
Admission: RE | Admit: 2013-12-06 | Discharge: 2013-12-06 | Disposition: A | Discharge status: Home / Self Care | Payer: Medicare Other | Source: Ambulatory Visit | Attending: Diagnostic Neuroimaging | Admitting: Diagnostic Neuroimaging

## 2013-12-06 DIAGNOSIS — R269 Unspecified abnormalities of gait and mobility: Secondary | ICD-10-CM

## 2013-12-06 DIAGNOSIS — H9193 Unspecified hearing loss, bilateral: Secondary | ICD-10-CM

## 2013-12-06 DIAGNOSIS — R42 Dizziness and giddiness: Secondary | ICD-10-CM

## 2013-12-06 DIAGNOSIS — G609 Hereditary and idiopathic neuropathy, unspecified: Secondary | ICD-10-CM

## 2013-12-06 DIAGNOSIS — G20 Parkinson's disease: Secondary | ICD-10-CM

## 2013-12-06 DIAGNOSIS — M21371 Foot drop, right foot: Secondary | ICD-10-CM

## 2013-12-06 DIAGNOSIS — M5416 Radiculopathy, lumbar region: Secondary | ICD-10-CM

## 2013-12-06 DIAGNOSIS — IMO0002 Reserved for concepts with insufficient information to code with codable children: Secondary | ICD-10-CM

## 2013-12-06 DIAGNOSIS — M21372 Foot drop, left foot: Secondary | ICD-10-CM

## 2013-12-06 DIAGNOSIS — G20C Parkinsonism, unspecified: Secondary | ICD-10-CM

## 2013-12-06 DIAGNOSIS — I739 Peripheral vascular disease, unspecified: Secondary | ICD-10-CM

## 2013-12-06 DIAGNOSIS — H8113 Benign paroxysmal vertigo, bilateral: Secondary | ICD-10-CM

## 2013-12-06 MED ORDER — GADOBENATE DIMEGLUMINE 529 MG/ML IV SOLN
18.0000 mL | Freq: Once | INTRAVENOUS | Status: AC | PRN
  Administered 2013-12-06: 18 mL via INTRAVENOUS

## 2013-12-07 ENCOUNTER — Telehealth: Payer: Self-pay | Admitting: Cardiology

## 2013-12-07 NOTE — Telephone Encounter (Signed)
Please call,she needs to know what his hemoglobin was on Tuesday.

## 2013-12-07 NOTE — Telephone Encounter (Signed)
Returning your call. °

## 2013-12-07 NOTE — Telephone Encounter (Signed)
Returned call to [name] no answer.[place].

## 2013-12-07 NOTE — Telephone Encounter (Signed)
Returned call to [name] no answer.[place].

## 2013-12-12 ENCOUNTER — Encounter: Payer: Self-pay | Admitting: Cardiovascular Disease

## 2013-12-12 ENCOUNTER — Other Ambulatory Visit: Payer: Medicare Other

## 2013-12-12 ENCOUNTER — Ambulatory Visit (INDEPENDENT_AMBULATORY_CARE_PROVIDER_SITE_OTHER): Payer: Medicare Other | Admitting: Physician Assistant

## 2013-12-12 ENCOUNTER — Encounter: Payer: Self-pay | Admitting: Physician Assistant

## 2013-12-12 VITALS — BP 141/61 | HR 53 | Ht 75.0 in | Wt 193.8 lb

## 2013-12-12 DIAGNOSIS — I480 Paroxysmal atrial fibrillation: Secondary | ICD-10-CM

## 2013-12-12 DIAGNOSIS — R600 Localized edema: Secondary | ICD-10-CM | POA: Insufficient documentation

## 2013-12-12 DIAGNOSIS — Z01818 Encounter for other preprocedural examination: Secondary | ICD-10-CM

## 2013-12-12 DIAGNOSIS — Z720 Tobacco use: Secondary | ICD-10-CM

## 2013-12-12 DIAGNOSIS — I739 Peripheral vascular disease, unspecified: Secondary | ICD-10-CM

## 2013-12-12 DIAGNOSIS — D509 Iron deficiency anemia, unspecified: Secondary | ICD-10-CM

## 2013-12-12 DIAGNOSIS — I10 Essential (primary) hypertension: Secondary | ICD-10-CM

## 2013-12-12 NOTE — Progress Notes (Signed)
Date:  12/12/2013   ID:  [name], DOB [birth year], MRN [id number]  PCP:  Chesley Noon, MD  Primary Cardiologist:  Jordan/Berry     History of Present Illness: [name] is a 80 y.o. male with  History of CAD with prior DES to LCX in 2006, repeat cath in 2011 and in June of 2014. He has chronic occlusion of the mid RCA and of the distal circumflex - managed medically, ischemic CM with EF 30 to 35%, ongoing tobacco abuse, SVT - on amiodarone, COPD, history of lung cancer and HTN. Has had recent GI bleed with colonoscopy/endoscopy performed in September 2015.   He was seen Dr. [name] for PV evaluation - underwent a PV angiogram on 11/20/13 and underwent successful restenting of  ISR within the previously placed right common iliac artery stent.  Discharged on DAPT. Also noted to have high grade, subtotal/total calcified SFAs bilaterally with 2 vessel runoff which Dr. [name] thought could be best treated with orbital rotational arthrectomy +- PTA/stenting.  Followup arterial Dopplers revealed decreased callosities in the right common iliac artery from 469-319 cm/s.  ABIs are unchanged as expected. Patient was recently seen by [name], nurse practitioner for lower extremity edema. She put him on when necessary Lasix 20 mg. He is also taking iron 3 times a day for anemia.  Hemoglobin is stable at 9.7.  Sees oncology next month.   Presents today for a post PV angiogram evaluation. Reports some mild tenderness in his right groin but has improvements in symptoms in the right leg. He still reports can't walk more than 30 feet without developing claudication in both legs.  He continues to smoke and has quit at least 100 times. He says that he took the Lasix for about 3 days and didn't notice any additional urination. The lower extremity edema is intermittent. He has tried the compression socks in the past he didn't actually make the swelling worse.   He currently denies nausea, vomiting, fever,  chest pain, shortness of breath beyond baseline, orthopnea, dizziness, PND, cough, congestion, abdominal pain, hematochezia, melena.  Wt Readings from Last 3 Encounters:  12/12/13 193 lb 12.8 oz (87.907 kg)  11/29/13 191 lb 12.8 oz (87 kg)  11/21/13 189 lb 9.5 oz (86 kg)     Past Medical History  Diagnosis Date  . CAD (coronary artery disease)     a. s/p DES-LCx 2006 b. repeat cath 2011- 90% distal LCx ISR, 100% mid RCA CTO, diffuse nonobstructive dz, medical management c. 07/2012 cath- 100% distal LCx ISR, 100% mid RCA CTO, nonobstructive dz; EF 40-45%  . PVC (premature ventricular contraction)   . HTN (hypertension)   . Paroxysmal a-fib   . PAD (peripheral artery disease)     history of stenting in the past  . COPD (chronic obstructive pulmonary disease)   . LV dysfunction   . Anemia, iron deficiency   . Family history of anesthesia complication     "daughter gets PONV" (11/20/2013)  . Anginal pain   . Pneumonia 1-2 X's  . History of blood transfusion     "he's had several; probably related to his cancer" (11/20/2013)  . GERD (gastroesophageal reflux disease)   . H/O hiatal hernia   . History of gastric ulcer   . Arthritis     "generalized"  . Chronic lower back pain   . Anxiety   . Myocardial infarction 09/2009; ~ 2014    [name] 10/24/2009;   . Lung cancer  dx'd 11/2005    non small cell    Current Outpatient Prescriptions  Medication Sig Dispense Refill  . albuterol (PROVENTIL HFA;VENTOLIN HFA) 108 (90 BASE) MCG/ACT inhaler Inhale 2 puffs into the lungs every 4 (four) hours as needed for wheezing or shortness of breath.      Marland Kitchen amiodarone (PACERONE) 200 MG tablet Take 200 mg by mouth daily.      Marland Kitchen amLODipine (NORVASC) 10 MG tablet Take 0.5 tablets (5 mg total) by mouth daily.  90 tablet  6  . aspirin EC 325 MG EC tablet Take 1 tablet (325 mg total) by mouth daily.  30 tablet  0  . clopidogrel (PLAVIX) 75 MG tablet Take 1 tablet by mouth daily.      Marland Kitchen ezetimibe (ZETIA) 10 MG  tablet Take 10 mg by mouth daily.      . ferrous sulfate 325 (65 FE) MG tablet Take 325 mg by mouth 3 (three) times daily with meals.      . fexofenadine (ALLEGRA) 180 MG tablet Take 180 mg by mouth daily as needed (allergies).       . furosemide (LASIX) 20 MG tablet Take 1 tablet (20 mg total) by mouth daily as needed. Daily as needed for swelling  90 tablet  3  . isosorbide mononitrate (IMDUR) 60 MG 24 hr tablet Take 60 mg by mouth at bedtime.      Marland Kitchen losartan (COZAAR) 100 MG tablet Take 100 mg by mouth daily.      . nitroGLYCERIN (NITROSTAT) 0.4 MG SL tablet Place 1 tablet (0.4 mg total) under the tongue every 5 (five) minutes as needed for chest pain (Chest pain).  25 tablet  6  . pantoprazole (PROTONIX) 40 MG tablet Take 40 mg by mouth daily.      . polyethylene glycol (MIRALAX / GLYCOLAX) packet Take 17 g by mouth every other day.        No current facility-administered medications for this visit.    Allergies:    Allergies  Allergen Reactions  . Doxycycline     arthralgias  . Atorvastatin Rash  . Sulfa Drugs Cross Reactors Rash    Social History:  The patient  reports that he has been smoking Cigarettes.  He has a 65 pack-year smoking history. He has never used smokeless tobacco. He reports that he drinks alcohol. He reports that he does not use illicit drugs.   Family history:   Family History  Problem Relation Age of Onset  . Heart Problems Mother   . Other Father     Motorcycle Accident    ROS:  Please see the history of present illness.  All other systems reviewed and negative.   PHYSICAL EXAM: VS:  BP 141/61  Pulse 53  Ht 6\' 3"  (1.905 m)  Wt 193 lb 12.8 oz (87.907 kg)  BMI 24.22 kg/m2 Well nourished, well developed, in no acute distress HEENT: Pupils are equal round react to light accommodation extraocular movements are intact.  Neck: no JVDNo cervical lymphadenopathy. Cardiac: Regular rate and rhythm without murmurs rubs or gallops. Lungs:  clear to  auscultation on the left with lower right wheeze.  Abd: soft, nontender, positive bowel sounds all quadrants. Ext: 1+  lower extremity edema.  2+ radials.  1+ left DP Skin: warm and dry.  Minimal ecchymosis in the right groin.  Mildly tender.  Neuro:  Grossly normal  EKG:   None  ASSESSMENT AND PLAN:  Problem List Items Addressed This Visit  Anemia, iron deficiency     Hemoglobin is stable at 9.7    Claudication     Mild improvement in the right leg since the procedure. However, the patient cannot walk 30 feet without developing symptoms.    HTN (hypertension)     Blood pressure mildly elevated. No changes in current therapy.    PAD (peripheral artery disease) - Primary     Status post stenting for in-stent restenosis of the right common iliac artery 11/20/2013.  The procedure revealed 100% occlusion of the right SFA and 99% occlusion of the left SFA.  Followup Dopplers showed decrease in velocities of the right common iliac from 469 to 319 cm/s.  Patient will be scheduled for PV angiogram and high-speed rotational atherectomy of the left SFA late next week.  He will hold cozaar and not take any lasix(taking PRN).    Relevant Orders      APTT      Basic metabolic panel      CBC      Protime-INR      LOWER EXTREMITY ANGIOGRAM   Paroxysmal a-fib     Regular rhythm on exam.    Tobacco abuse     Tobacco cessation discussed. Patient states he quit 100 times.     Other Visit Diagnoses   Preop testing        Relevant Orders       APTT       Basic metabolic panel       CBC       Protime-INR

## 2013-12-12 NOTE — Assessment & Plan Note (Signed)
Mild improvement in the right leg since the procedure. However, the patient cannot walk 30 feet without developing symptoms.

## 2013-12-12 NOTE — Assessment & Plan Note (Signed)
Status post stenting for in-stent restenosis of the right common iliac artery 11/20/2013.  The procedure revealed 100% occlusion of the right SFA and 99% occlusion of the left SFA.  Followup Dopplers showed decrease in velocities of the right common iliac from 469 to 319 cm/s.  Patient will be scheduled for PV angiogram and high-speed rotational atherectomy of the left SFA late next week.  [name] will hold cozaar and not take any lasix(taking PRN).

## 2013-12-12 NOTE — Assessment & Plan Note (Signed)
Blood pressure mildly elevated. No changes in current therapy.

## 2013-12-12 NOTE — Assessment & Plan Note (Signed)
Tobacco cessation discussed. Patient states he quit 100 times.

## 2013-12-12 NOTE — Assessment & Plan Note (Signed)
We'll continue Lasix as needed. His edema does not look that bad this morning.  More puffy around the posterior ankles.

## 2013-12-12 NOTE — Assessment & Plan Note (Signed)
Regular rhythm on exam.

## 2013-12-12 NOTE — Assessment & Plan Note (Signed)
Hemoglobin is stable at 9.7

## 2013-12-12 NOTE — Patient Instructions (Addendum)
Your physician has requested that you have a peripheral vascular angiogram. This exam is performed at the hospital. During this exam IV contrast is used to look at arterial blood flow. Please review the information sheet given for details.  Dr. [name] has ordered a peripheral angiogram to be done at [facility].  This procedure is going to look at the bloodflow in your lower extremities.  If Dr. [name] is able to open up the arteries, you will have to spend one night in the hospital.  If he is not able to open the arteries, you will be able to go home that same day.  This will be scheduled the end of next week.  After the procedure, you will not be allowed to drive for 3 days or push, pull, or lift anything greater than 10 lbs for one week.    You will be required to have the following tests prior to the procedure:  1. Blood work-the blood work can be done no more than 7 days prior to the procedure.  It can be done at any [facility] lab.  There is one downstairs on the first floor of this building and one in the [place] ([address])  2. Chest Xray-the chest xray order has already been placed at the [facility].     *REPS*   Clayborn Heron.

## 2013-12-13 ENCOUNTER — Other Ambulatory Visit: Payer: Self-pay | Admitting: *Deleted

## 2013-12-13 ENCOUNTER — Other Ambulatory Visit: Payer: Self-pay | Admitting: Cardiology

## 2013-12-13 DIAGNOSIS — Z01818 Encounter for other preprocedural examination: Secondary | ICD-10-CM

## 2013-12-14 ENCOUNTER — Encounter: Payer: Self-pay | Admitting: *Deleted

## 2013-12-15 ENCOUNTER — Encounter (HOSPITAL_COMMUNITY): Payer: Self-pay | Admitting: Pharmacy Technician

## 2013-12-15 NOTE — Telephone Encounter (Signed)
Brita Romp RN mailed letter with lab results 12/14/13.

## 2013-12-19 ENCOUNTER — Encounter: Payer: Self-pay | Admitting: *Deleted

## 2013-12-21 ENCOUNTER — Other Ambulatory Visit: Payer: Self-pay | Admitting: Dermatology

## 2013-12-26 ENCOUNTER — Telehealth: Payer: Self-pay | Admitting: Diagnostic Neuroimaging

## 2013-12-26 NOTE — Telephone Encounter (Signed)
Patient's daughter requesting MRI results.  Please call and advise.

## 2013-12-28 LAB — BASIC METABOLIC PANEL
BUN: 7 mg/dL (ref 6–23)
CO2: 25 meq/L (ref 19–32)
Calcium: 9.9 mg/dL (ref 8.4–10.5)
Chloride: 98 mEq/L (ref 96–112)
Creat: 0.75 mg/dL (ref 0.50–1.35)
GLUCOSE: 72 mg/dL (ref 70–99)
Potassium: 4.9 mEq/L (ref 3.5–5.3)
Sodium: 131 mEq/L — ABNORMAL LOW (ref 135–145)

## 2013-12-28 LAB — APTT: aPTT: 37 seconds (ref 24–37)

## 2013-12-28 LAB — PROTIME-INR
INR: 1.02 (ref <1.50)
Prothrombin Time: 13.4 seconds (ref 11.6–15.2)

## 2013-12-28 NOTE — Telephone Encounter (Signed)
Patient's daughter calling again for MRI results. Please call.

## 2013-12-29 ENCOUNTER — Other Ambulatory Visit: Payer: Self-pay | Admitting: Diagnostic Neuroimaging

## 2013-12-29 DIAGNOSIS — R269 Unspecified abnormalities of gait and mobility: Secondary | ICD-10-CM

## 2013-12-29 DIAGNOSIS — R29898 Other symptoms and signs involving the musculoskeletal system: Secondary | ICD-10-CM

## 2013-12-29 LAB — CBC
HEMATOCRIT: 32.9 % — AB (ref 39.0–52.0)
HEMOGLOBIN: 10.8 g/dL — AB (ref 13.0–17.0)
MCH: 28.3 pg (ref 26.0–34.0)
MCHC: 32.8 g/dL (ref 30.0–36.0)
MCV: 86.4 fL (ref 78.0–100.0)
Platelets: 297 10*3/uL (ref 150–400)
RBC: 3.81 MIL/uL — ABNORMAL LOW (ref 4.22–5.81)
RDW: 24.6 % — AB (ref 11.5–15.5)
WBC: 6.9 10*3/uL (ref 4.0–10.5)

## 2013-12-29 NOTE — Telephone Encounter (Signed)
Spoke to daughter. Gave results per Dr. Leta Baptist. Daughter wanted Dr. Leta Baptist to know patient will be having leg surgery (stent placement) on 01/04/14.

## 2013-12-29 NOTE — Telephone Encounter (Signed)
Agree. Will see him in follow up. -VRP

## 2014-01-02 ENCOUNTER — Telehealth: Payer: Self-pay | Admitting: Diagnostic Neuroimaging

## 2014-01-02 NOTE — Telephone Encounter (Signed)
Patient has f/u appt on 01/24/14

## 2014-01-02 NOTE — Telephone Encounter (Signed)
Spoke to patient regarding scheduling NCV/EMG test per Dr. Leta Baptist, states that he will have to call back later to schedule because he is going to be at the hospital to have a procedure done.

## 2014-01-04 ENCOUNTER — Ambulatory Visit (HOSPITAL_COMMUNITY)
Admission: RE | Admit: 2014-01-04 | Discharge: 2014-01-05 | Disposition: A | Discharge status: Home / Self Care | Payer: Medicare Other | Source: Ambulatory Visit | Attending: Cardiovascular Disease | Admitting: Cardiovascular Disease

## 2014-01-04 ENCOUNTER — Encounter (HOSPITAL_COMMUNITY)
Admission: RE | Disposition: A | Discharge status: Home / Self Care | Payer: Self-pay | Source: Ambulatory Visit | Attending: Cardiovascular Disease

## 2014-01-04 ENCOUNTER — Encounter (HOSPITAL_COMMUNITY): Payer: Self-pay

## 2014-01-04 DIAGNOSIS — Z7902 Long term (current) use of antithrombotics/antiplatelets: Secondary | ICD-10-CM | POA: Insufficient documentation

## 2014-01-04 DIAGNOSIS — I252 Old myocardial infarction: Secondary | ICD-10-CM | POA: Diagnosis not present

## 2014-01-04 DIAGNOSIS — Z01818 Encounter for other preprocedural examination: Secondary | ICD-10-CM

## 2014-01-04 DIAGNOSIS — G8929 Other chronic pain: Secondary | ICD-10-CM | POA: Diagnosis not present

## 2014-01-04 DIAGNOSIS — Z881 Allergy status to other antibiotic agents status: Secondary | ICD-10-CM | POA: Insufficient documentation

## 2014-01-04 DIAGNOSIS — R001 Bradycardia, unspecified: Secondary | ICD-10-CM | POA: Diagnosis present

## 2014-01-04 DIAGNOSIS — F419 Anxiety disorder, unspecified: Secondary | ICD-10-CM | POA: Insufficient documentation

## 2014-01-04 DIAGNOSIS — F17210 Nicotine dependence, cigarettes, uncomplicated: Secondary | ICD-10-CM | POA: Insufficient documentation

## 2014-01-04 DIAGNOSIS — M79604 Pain in right leg: Secondary | ICD-10-CM | POA: Diagnosis present

## 2014-01-04 DIAGNOSIS — I7092 Chronic total occlusion of artery of the extremities: Secondary | ICD-10-CM | POA: Diagnosis not present

## 2014-01-04 DIAGNOSIS — I480 Paroxysmal atrial fibrillation: Secondary | ICD-10-CM | POA: Diagnosis present

## 2014-01-04 DIAGNOSIS — J449 Chronic obstructive pulmonary disease, unspecified: Secondary | ICD-10-CM | POA: Diagnosis not present

## 2014-01-04 DIAGNOSIS — K219 Gastro-esophageal reflux disease without esophagitis: Secondary | ICD-10-CM | POA: Diagnosis not present

## 2014-01-04 DIAGNOSIS — I2510 Atherosclerotic heart disease of native coronary artery without angina pectoris: Secondary | ICD-10-CM | POA: Diagnosis present

## 2014-01-04 DIAGNOSIS — M545 Low back pain: Secondary | ICD-10-CM | POA: Diagnosis not present

## 2014-01-04 DIAGNOSIS — Z888 Allergy status to other drugs, medicaments and biological substances status: Secondary | ICD-10-CM | POA: Diagnosis not present

## 2014-01-04 DIAGNOSIS — I255 Ischemic cardiomyopathy: Secondary | ICD-10-CM | POA: Diagnosis not present

## 2014-01-04 DIAGNOSIS — Z7982 Long term (current) use of aspirin: Secondary | ICD-10-CM | POA: Insufficient documentation

## 2014-01-04 DIAGNOSIS — Z882 Allergy status to sulfonamides status: Secondary | ICD-10-CM | POA: Diagnosis not present

## 2014-01-04 DIAGNOSIS — I471 Supraventricular tachycardia: Secondary | ICD-10-CM | POA: Insufficient documentation

## 2014-01-04 DIAGNOSIS — I70211 Atherosclerosis of native arteries of extremities with intermittent claudication, right leg: Secondary | ICD-10-CM | POA: Insufficient documentation

## 2014-01-04 DIAGNOSIS — Z8719 Personal history of other diseases of the digestive system: Secondary | ICD-10-CM

## 2014-01-04 DIAGNOSIS — I10 Essential (primary) hypertension: Secondary | ICD-10-CM | POA: Diagnosis not present

## 2014-01-04 DIAGNOSIS — C3490 Malignant neoplasm of unspecified part of unspecified bronchus or lung: Secondary | ICD-10-CM | POA: Diagnosis not present

## 2014-01-04 DIAGNOSIS — I70212 Atherosclerosis of native arteries of extremities with intermittent claudication, left leg: Secondary | ICD-10-CM

## 2014-01-04 DIAGNOSIS — I739 Peripheral vascular disease, unspecified: Secondary | ICD-10-CM | POA: Diagnosis present

## 2014-01-04 DIAGNOSIS — D509 Iron deficiency anemia, unspecified: Secondary | ICD-10-CM | POA: Insufficient documentation

## 2014-01-04 DIAGNOSIS — Z85118 Personal history of other malignant neoplasm of bronchus and lung: Secondary | ICD-10-CM | POA: Diagnosis not present

## 2014-01-04 HISTORY — PX: FEMORAL ARTERY STENT: SHX1583

## 2014-01-04 HISTORY — PX: LOWER EXTREMITY ANGIOGRAM: SHX5508

## 2014-01-04 LAB — TROPONIN I: Troponin I: <0.3 ng/mL (ref <0.30)

## 2014-01-04 LAB — POCT ACTIVATED CLOTTING TIME
ACTIVATED CLOTTING TIME: 208 s
Activated Clotting Time: 219 seconds
Activated Clotting Time: 242 s
Activated Clotting Time: 157 seconds

## 2014-01-04 LAB — HEMOGLOBIN AND HEMATOCRIT, BLOOD
HCT: 33.7 % — ABNORMAL LOW (ref 39.0–52.0)
HEMOGLOBIN: 10.8 g/dL — AB (ref 13.0–17.0)

## 2014-01-04 SURGERY — ANGIOGRAM, LOWER EXTREMITY
Anesthesia: LOCAL

## 2014-01-04 MED ORDER — NITROGLYCERIN IN D5W 200-5 MCG/ML-% IV SOLN
INTRAVENOUS | Status: AC
  Filled 2014-01-04: qty 250

## 2014-01-04 MED ORDER — SODIUM CHLORIDE 0.9 % IV SOLN
INTRAVENOUS | Status: DC
Start: 2014-01-05 — End: 2014-01-04
  Administered 2014-01-04: 75 mL/h via INTRAVENOUS

## 2014-01-04 MED ORDER — HEPARIN (PORCINE) IN NACL 2-0.9 UNIT/ML-% IJ SOLN
INTRAMUSCULAR | Status: AC
  Filled 2014-01-04: qty 500

## 2014-01-04 MED ORDER — AMIODARONE HCL 200 MG PO TABS
200.0000 mg | ORAL_TABLET | Freq: Every day | ORAL | Status: DC
  Filled 2014-01-04 (×2): qty 1

## 2014-01-04 MED ORDER — ASPIRIN EC 325 MG PO TBEC
325.0000 mg | DELAYED_RELEASE_TABLET | Freq: Every day | ORAL | Status: DC
  Filled 2014-01-04: qty 1

## 2014-01-04 MED ORDER — MIDAZOLAM HCL 2 MG/2ML IJ SOLN
INTRAMUSCULAR | Status: AC
  Filled 2014-01-04: qty 2

## 2014-01-04 MED ORDER — PANTOPRAZOLE SODIUM 40 MG PO TBEC
40.0000 mg | DELAYED_RELEASE_TABLET | Freq: Every day | ORAL | Status: DC
  Administered 2014-01-05: 10:00:00 40 mg via ORAL
  Filled 2014-01-04: qty 1

## 2014-01-04 MED ORDER — SODIUM CHLORIDE 0.9 % IJ SOLN: 3.0000 mL | INTRAMUSCULAR | Status: DC | PRN

## 2014-01-04 MED ORDER — FERROUS SULFATE 325 (65 FE) MG PO TABS
325.0000 mg | ORAL_TABLET | Freq: Three times a day (TID) | ORAL | Status: DC
  Administered 2014-01-04 – 2014-01-05 (×2): 325 mg via ORAL
  Filled 2014-01-04 (×5): qty 1

## 2014-01-04 MED ORDER — VERAPAMIL HCL 2.5 MG/ML IV SOLN
INTRAVENOUS | Status: AC
  Filled 2014-01-04: qty 2

## 2014-01-04 MED ORDER — ISOSORBIDE MONONITRATE ER 60 MG PO TB24
60.0000 mg | ORAL_TABLET | Freq: Every day | ORAL | Status: DC
  Administered 2014-01-04: 60 mg via ORAL
  Filled 2014-01-04 (×2): qty 1

## 2014-01-04 MED ORDER — NITROGLYCERIN 0.4 MG SL SUBL: 0.4000 mg | SUBLINGUAL_TABLET | SUBLINGUAL | Status: DC | PRN

## 2014-01-04 MED ORDER — ASPIRIN EC 325 MG PO TBEC: 325.0000 mg | DELAYED_RELEASE_TABLET | Freq: Every day | ORAL | Status: DC

## 2014-01-04 MED ORDER — ALBUTEROL SULFATE (2.5 MG/3ML) 0.083% IN NEBU: 3.0000 mL | INHALATION_SOLUTION | RESPIRATORY_TRACT | Status: DC | PRN

## 2014-01-04 MED ORDER — CLOPIDOGREL BISULFATE 75 MG PO TABS
75.0000 mg | ORAL_TABLET | Freq: Every day | ORAL | Status: DC
  Administered 2014-01-05: 75 mg via ORAL
  Filled 2014-01-04: qty 1

## 2014-01-04 MED ORDER — HYDRALAZINE HCL 20 MG/ML IJ SOLN: 10.0000 mg | INTRAMUSCULAR | Status: DC | PRN

## 2014-01-04 MED ORDER — LORATADINE 10 MG PO TABS
10.0000 mg | ORAL_TABLET | Freq: Every day | ORAL | Status: DC
  Filled 2014-01-04 (×2): qty 1

## 2014-01-04 MED ORDER — HEPARIN SODIUM (PORCINE) 1000 UNIT/ML IJ SOLN
INTRAMUSCULAR | Status: AC
  Filled 2014-01-04: qty 1

## 2014-01-04 MED ORDER — ACETAMINOPHEN 325 MG PO TABS
650.0000 mg | ORAL_TABLET | ORAL | Status: DC | PRN
  Administered 2014-01-05: 12:00:00 650 mg via ORAL
  Filled 2014-01-04: qty 2

## 2014-01-04 MED ORDER — FENTANYL CITRATE 0.05 MG/ML IJ SOLN
INTRAMUSCULAR | Status: AC
  Filled 2014-01-04: qty 2

## 2014-01-04 MED ORDER — LOSARTAN POTASSIUM 50 MG PO TABS
100.0000 mg | ORAL_TABLET | Freq: Every day | ORAL | Status: DC
  Administered 2014-01-04: 100 mg via ORAL
  Filled 2014-01-04 (×2): qty 2

## 2014-01-04 MED ORDER — POLYETHYLENE GLYCOL 3350 17 G PO PACK
17.0000 g | PACK | ORAL | Status: DC
  Administered 2014-01-05: 10:00:00 17 g via ORAL
  Filled 2014-01-04: qty 1

## 2014-01-04 MED ORDER — ONDANSETRON HCL 4 MG/2ML IJ SOLN
4.0000 mg | Freq: Four times a day (QID) | INTRAMUSCULAR | Status: DC | PRN
Start: 2014-01-04 — End: 2014-01-05
  Administered 2014-01-04: 4 mg via INTRAVENOUS

## 2014-01-04 MED ORDER — ASPIRIN 81 MG PO CHEW
CHEWABLE_TABLET | ORAL | Status: AC
  Filled 2014-01-04: qty 1

## 2014-01-04 MED ORDER — CLOPIDOGREL BISULFATE 75 MG PO TABS: 75.0000 mg | ORAL_TABLET | Freq: Every day | ORAL | Status: DC

## 2014-01-04 MED ORDER — HYDRALAZINE HCL 20 MG/ML IJ SOLN
INTRAMUSCULAR | Status: AC
  Filled 2014-01-04: qty 1

## 2014-01-04 MED ORDER — LIDOCAINE HCL (PF) 1 % IJ SOLN
INTRAMUSCULAR | Status: AC
  Filled 2014-01-04: qty 30

## 2014-01-04 MED ORDER — AMLODIPINE BESYLATE 5 MG PO TABS
5.0000 mg | ORAL_TABLET | Freq: Every day | ORAL | Status: DC
  Administered 2014-01-05: 10:00:00 5 mg via ORAL
  Filled 2014-01-04: qty 1

## 2014-01-04 MED ORDER — PANTOPRAZOLE SODIUM 40 MG IV SOLR
40.0000 mg | Freq: Once | INTRAVENOUS | Status: AC
  Administered 2014-01-04: 40 mg via INTRAVENOUS
  Filled 2014-01-04: qty 40

## 2014-01-04 MED ORDER — FUROSEMIDE 20 MG PO TABS
20.0000 mg | ORAL_TABLET | Freq: Every day | ORAL | Status: DC
  Filled 2014-01-04 (×2): qty 1

## 2014-01-04 MED ORDER — MORPHINE SULFATE 2 MG/ML IJ SOLN
2.0000 mg | INTRAMUSCULAR | Status: DC | PRN
Start: 2014-01-04 — End: 2014-01-05
  Administered 2014-01-04 – 2014-01-05 (×3): 2 mg via INTRAVENOUS
  Filled 2014-01-04 (×3): qty 1

## 2014-01-04 MED ORDER — SODIUM CHLORIDE 0.9 % IV SOLN: INTRAVENOUS | Status: AC

## 2014-01-04 MED ORDER — ASPIRIN 81 MG PO CHEW
81.0000 mg | CHEWABLE_TABLET | ORAL | Status: AC
  Administered 2014-01-04: 81 mg via ORAL

## 2014-01-04 MED ORDER — HEPARIN (PORCINE) IN NACL 2-0.9 UNIT/ML-% IJ SOLN
INTRAMUSCULAR | Status: AC
  Filled 2014-01-04: qty 1000

## 2014-01-04 NOTE — CV Procedure (Signed)
[name] is a 80 y.o. male    [id number] LOCATION:  FACILITY: [facility]  PHYSICIAN: Quay Burow, M.D. 10-Jun-1933   DATE OF PROCEDURE:  01/04/2014  DATE OF DISCHARGE:     PV Angiogram/Intervention    History obtained from chart review.[name] is a 80 y.o. male with History of CAD with prior DES to LCX in 2006, repeat cath in 2011 and in June of 2014. He has chronic occlusion of the mid RCA and of the distal circumflex - managed medically, ischemic CM with EF 30 to 35%, ongoing tobacco abuse, SVT - on amiodarone, COPD, history of lung cancer and HTN. Has had recent GI bleed with colonoscopy/endoscopy performed in September 2015.  He was seen Dr. [name] for PV evaluation - underwent a PV angiogram on 11/20/13 and underwent successful restenting of ISR within the previously placed right common iliac artery stent. Discharged on DAPT. Also noted to have high grade, subtotal/total calcified SFAs bilaterally with 2 vessel runoff which Dr. [name] thought could be best treated with orbital rotational arthrectomy +- PTA/stenting. Followup arterial Dopplers revealed decreased callosities in the right common iliac artery from 469-319 cm/s. ABIs are unchanged as expected. Patient was recently seen by [name], nurse practitioner for lower extremity edema. She put him on when necessary Lasix 20 mg. He is also taking iron 3 times a day for anemia. Hemoglobin is stable at 9.7.  He had percutaneous intervention of his right common iliac artery and at that time angiography documented occluded SFAs bilaterally. His right leg improved but he still has bilateral claudication left greater than right. He presents now for staged left SFA diamondback orbital rotation atherectomy plus or minus PTCA and stenting.  PROCEDURE DESCRIPTION:   The patient was brought to the second floor [facility] cath lab in the postabsorptive state. He was premedicated with Valium 5 mg by mouth, IV  Versed and fentanyl. His right groin was prepped and shaved in usual sterile fashion. Xylocaine 1% was used for local anesthesia. A 7 French sheath was inserted into the right common femoral artery using standard Seldinger technique. A 7 [place] multipurpose destination sheath was then at advanced across the iliac bifurcation over a 035 Rosen wire.Visipaque dye was used for the entirety of the case. Retrograde aortic pressure was monitored during the case.   HEMODYNAMICS:    AO SYSTOLIC/AO DIASTOLIC: 191/47   Angiographic Data:   The patient received a total of 11,000 units of heparin with an ACT of 242. Total contrast administered to the patient was 241 mL. I was able to cross the chronic total occlusion with a 3 on a chronic total occlusion catheter and a [place] 20 CTO wire. I cross the CTO and demonstrated that I was intraluminal and distal vessel. I then exchanged for a Viper wire and ballooned the chronic total occlusion with a 4 mm x 6 cm balloon. Following this I performed orbital rotational atherectomy with a 2 mm diamondback burr up to 120,000 rpm's. I then ballooned the entire segment with a 6 mm x 220 mm balloon. I did put a  NAV 6 distal protection device in the above-the-knee popliteal artery. Following this I stented with a 6 mm x 250 mm long Viabahn covered stent distally and mid overlapping with a 6 mg at 150 mm Viabahn stent encompassing the ostium of the SFA. I postdilated the entire stented segment with a 6 mm x 220 mg balloon  Resulting in reduction of a long chronic total  occlusion to less than 10% residual with excellent flow. Completion angiography was performed using bolus chase technique. The sheath was done which are across the bifurcation and exchanged over an 035 wire for a short 7 [place] sheath. Unfortunately, there was some "oozing" around the sheath and this had to be upgraded to an 8 [place] sheath. Because of residual using a FemStop device will be  used.  IMPRESSION:successful diamondback orbital rotation atherectomy using distal protection, PTA and stenting using long overlapping Viabahn  covered stents of a long calcified chronic total occlusion in the setting of lifestyle limiting claudication. The patient is already on dual antiplatelet therapy. He'll be hydrated overnight, discharged home in the morning. We will get follow-up lower extremity arterial Doppler studies in order office next week and he will see mid-level provider back in 2-3 weeks on a daily I'm in the office.    Lorretta Harp MD, [facility] 01/04/2014 12:18 PM

## 2014-01-04 NOTE — Interval H&P Note (Signed)
History and Physical Interval Note:  01/04/2014 9:57 AM  [name]  has presented today for surgery, with the diagnosis of PVD  The various methods of treatment have been discussed with the patient and family. After consideration of risks, benefits and other options for treatment, the patient has consented to  Procedure(s): LOWER EXTREMITY ANGIOGRAM (N/A) as a surgical intervention .  The patient's history has been reviewed, patient examined, no change in status, stable for surgery.  I have reviewed the patient's chart and labs.  Questions were answered to the patient's satisfaction.     Lorretta Harp

## 2014-01-04 NOTE — Progress Notes (Signed)
Site area: right groin  Site Prior to Removal:  Level 0  Pressure Applied For 20 MINUTES    Minutes Beginning at 1405  Manual:   Yes.    Patient Status During Pull:  stable  Post Pull Groin Site:  Level 0  Post Pull Instructions Given:  Yes.    Post Pull Pulses Present:  Yes.    Dressing Applied:  Yes.    Comments: 1900 Frequent checks of right groin, noted no change in assessment, remains stable, level 0, dressing dry and intact

## 2014-01-04 NOTE — Progress Notes (Addendum)
Called to see pt re: hypotension, bradycardia, abd pain and chest pain  [name] came up from the cath lab after peripheral PCI.  He had a  fem stop placed on his right groin because of oozing at his sheath which is an 52 [place]. After arrival to the floor, he developed hypotension and bradycardia with a heart rate that was 30s at one point. He was complaining of pain across his chest. He was pale and diaphoretic.  He was initially treated with IV fluids and blood pressure slowly improved. His heart rate remained in the 40s but family stated that his heart rate is normally in the 50s-60s at home. He is compliant with his amiodarone but is not on any other rate-lowering medications.  Once his blood pressure improved, his color improved and the diaphoresis improved.  As his blood pressure improved the chest pain improved. His ECG was reviewed and showed sinus bradycardia with no acute ischemic changes.  Discussed the situation with family. He was still complaining of abdominal discomfort and also some flank pain. The flank pain was eased by position change. The chest pain improved with improvement in his blood pressure and elevation of his head.  His ACT is 157 and sheath will be pulled.  This is likely a vasovagal response, but we will need to cycle cardiac enzymes and monitor carefully for procedure complications.  He will be given IV Protonix times one. Once his sheath is pulled, we'll check an H&H and consider abdominal CT to rule out bleed. Continue to follow his heart rate, but unless he has truly symptomatic bradycardia, no plans for atropine or pacemaker.  Family and patient were updated and agreeable to this plan of care.

## 2014-01-04 NOTE — Plan of Care (Signed)
Problem: Consults Goal: Vascular Cath Int Patient Education (See Patient Education module for education specifics.) Outcome: Completed/Met Date Met:  01/04/14 Goal: Skin Care Protocol Initiated - if Braden Score 18 or less If consults are not indicated, leave blank or document N/A Outcome: Completed/Met Date Met:  01/04/14 Goal: Tobacco Cessation referral if indicated Outcome: Completed/Met Date Met:  01/04/14  Problem: Phase I Progression Outcomes Goal: Distal pulses equal to baseline Outcome: Completed/Met Date Met:  01/04/14 Goal: Vascular site scale level 0 - I Vascular Site Scale Level 0: No bruising/bleeding/hematoma Level I (Mild): Bruising/Ecchymosis, minimal bleeding/ooozing, palpable hematoma < 3 cm Level II (Moderate): Bleeding not affecting hemodynamic parameters, pseudoaneurysm, palpable hematoma > 3 cm Level III (Severe) Bleeding which affects hemodynamic parameters or retroperitoneal hemorrhage  Outcome: Completed/Met Date Met:  01/04/14 Goal: Pain controlled with appropriate interventions Outcome: Completed/Met Date Met:  01/04/14 Goal: Voiding-avoid urinary catheter unless indicated Outcome: Completed/Met Date Met:  01/04/14 Goal: Hemodynamically stable Outcome: Completed/Met Date Met:  01/04/14

## 2014-01-04 NOTE — H&P (View-Only) (Signed)
Date:  12/12/2013   ID:  [name], DOB [birth year], MRN [id number]  PCP:  Chesley Noon, MD  Primary Cardiologist:  Jordan/Berry     History of Present Illness: [name] is a 80 y.o. male with  History of CAD with prior DES to LCX in 2006, repeat cath in 2011 and in June of 2014. He has chronic occlusion of the mid RCA and of the distal circumflex - managed medically, ischemic CM with EF 30 to 35%, ongoing tobacco abuse, SVT - on amiodarone, COPD, history of lung cancer and HTN. Has had recent GI bleed with colonoscopy/endoscopy performed in September 2015.   He was seen Dr. [name] for PV evaluation - underwent a PV angiogram on 11/20/13 and underwent successful restenting of  ISR within the previously placed right common iliac artery stent.  Discharged on DAPT. Also noted to have high grade, subtotal/total calcified SFAs bilaterally with 2 vessel runoff which Dr. [name] thought could be best treated with orbital rotational arthrectomy +- PTA/stenting.  Followup arterial Dopplers revealed decreased callosities in the right common iliac artery from 469-319 cm/s.  ABIs are unchanged as expected. Patient was recently seen by [name], nurse practitioner for lower extremity edema. She put him on when necessary Lasix 20 mg. He is also taking iron 3 times a day for anemia.  Hemoglobin is stable at 9.7.  Sees oncology next month.   Presents today for a post PV angiogram evaluation. Reports some mild tenderness in his right groin but has improvements in symptoms in the right leg. He still reports can't walk more than 30 feet without developing claudication in both legs.  He continues to smoke and has quit at least 100 times. He says that he took the Lasix for about 3 days and didn't notice any additional urination. The lower extremity edema is intermittent. He has tried the compression socks in the past he didn't actually make the swelling worse.   He currently denies nausea, vomiting, fever,  chest pain, shortness of breath beyond baseline, orthopnea, dizziness, PND, cough, congestion, abdominal pain, hematochezia, melena.  Wt Readings from Last 3 Encounters:  12/12/13 193 lb 12.8 oz (87.907 kg)  11/29/13 191 lb 12.8 oz (87 kg)  11/21/13 189 lb 9.5 oz (86 kg)     Past Medical History  Diagnosis Date  . CAD (coronary artery disease)     a. s/p DES-LCx 2006 b. repeat cath 2011- 90% distal LCx ISR, 100% mid RCA CTO, diffuse nonobstructive dz, medical management c. 07/2012 cath- 100% distal LCx ISR, 100% mid RCA CTO, nonobstructive dz; EF 40-45%  . PVC (premature ventricular contraction)   . HTN (hypertension)   . Paroxysmal a-fib   . PAD (peripheral artery disease)     history of stenting in the past  . COPD (chronic obstructive pulmonary disease)   . LV dysfunction   . Anemia, iron deficiency   . Family history of anesthesia complication     "daughter gets PONV" (11/20/2013)  . Anginal pain   . Pneumonia 1-2 X's  . History of blood transfusion     "he's had several; probably related to his cancer" (11/20/2013)  . GERD (gastroesophageal reflux disease)   . H/O hiatal hernia   . History of gastric ulcer   . Arthritis     "generalized"  . Chronic lower back pain   . Anxiety   . Myocardial infarction 09/2009; ~ 2014    [name] 10/24/2009;   . Lung cancer  dx'd 11/2005    non small cell    Current Outpatient Prescriptions  Medication Sig Dispense Refill  . albuterol (PROVENTIL HFA;VENTOLIN HFA) 108 (90 BASE) MCG/ACT inhaler Inhale 2 puffs into the lungs every 4 (four) hours as needed for wheezing or shortness of breath.      Marland Kitchen amiodarone (PACERONE) 200 MG tablet Take 200 mg by mouth daily.      Marland Kitchen amLODipine (NORVASC) 10 MG tablet Take 0.5 tablets (5 mg total) by mouth daily.  90 tablet  6  . aspirin EC 325 MG EC tablet Take 1 tablet (325 mg total) by mouth daily.  30 tablet  0  . clopidogrel (PLAVIX) 75 MG tablet Take 1 tablet by mouth daily.      Marland Kitchen ezetimibe (ZETIA) 10 MG  tablet Take 10 mg by mouth daily.      . ferrous sulfate 325 (65 FE) MG tablet Take 325 mg by mouth 3 (three) times daily with meals.      . fexofenadine (ALLEGRA) 180 MG tablet Take 180 mg by mouth daily as needed (allergies).       . furosemide (LASIX) 20 MG tablet Take 1 tablet (20 mg total) by mouth daily as needed. Daily as needed for swelling  90 tablet  3  . isosorbide mononitrate (IMDUR) 60 MG 24 hr tablet Take 60 mg by mouth at bedtime.      Marland Kitchen losartan (COZAAR) 100 MG tablet Take 100 mg by mouth daily.      . nitroGLYCERIN (NITROSTAT) 0.4 MG SL tablet Place 1 tablet (0.4 mg total) under the tongue every 5 (five) minutes as needed for chest pain (Chest pain).  25 tablet  6  . pantoprazole (PROTONIX) 40 MG tablet Take 40 mg by mouth daily.      . polyethylene glycol (MIRALAX / GLYCOLAX) packet Take 17 g by mouth every other day.        No current facility-administered medications for this visit.    Allergies:    Allergies  Allergen Reactions  . Doxycycline     arthralgias  . Atorvastatin Rash  . Sulfa Drugs Cross Reactors Rash    Social History:  The patient  reports that he has been smoking Cigarettes.  He has a 65 pack-year smoking history. He has never used smokeless tobacco. He reports that he drinks alcohol. He reports that he does not use illicit drugs.   Family history:   Family History  Problem Relation Age of Onset  . Heart Problems Mother   . Other Father     Motorcycle Accident    ROS:  Please see the history of present illness.  All other systems reviewed and negative.   PHYSICAL EXAM: VS:  BP 141/61  Pulse 53  Ht 6\' 3"  (1.905 m)  Wt 193 lb 12.8 oz (87.907 kg)  BMI 24.22 kg/m2 Well nourished, well developed, in no acute distress HEENT: Pupils are equal round react to light accommodation extraocular movements are intact.  Neck: no JVDNo cervical lymphadenopathy. Cardiac: Regular rate and rhythm without murmurs rubs or gallops. Lungs:  clear to  auscultation on the left with lower right wheeze.  Abd: soft, nontender, positive bowel sounds all quadrants. Ext: 1+  lower extremity edema.  2+ radials.  1+ left DP Skin: warm and dry.  Minimal ecchymosis in the right groin.  Mildly tender.  Neuro:  Grossly normal  EKG:   None  ASSESSMENT AND PLAN:  Problem List Items Addressed This Visit  Anemia, iron deficiency     Hemoglobin is stable at 9.7    Claudication     Mild improvement in the right leg since the procedure. However, the patient cannot walk 30 feet without developing symptoms.    HTN (hypertension)     Blood pressure mildly elevated. No changes in current therapy.    PAD (peripheral artery disease) - Primary     Status post stenting for in-stent restenosis of the right common iliac artery 11/20/2013.  The procedure revealed 100% occlusion of the right SFA and 99% occlusion of the left SFA.  Followup Dopplers showed decrease in velocities of the right common iliac from 469 to 319 cm/s.  Patient will be scheduled for PV angiogram and high-speed rotational atherectomy of the left SFA late next week.  He will hold cozaar and not take any lasix(taking PRN).    Relevant Orders      APTT      Basic metabolic panel      CBC      Protime-INR      LOWER EXTREMITY ANGIOGRAM   Paroxysmal a-fib     Regular rhythm on exam.    Tobacco abuse     Tobacco cessation discussed. Patient states he quit 100 times.     Other Visit Diagnoses   Preop testing        Relevant Orders       APTT       Basic metabolic panel       CBC       Protime-INR

## 2014-01-05 ENCOUNTER — Other Ambulatory Visit: Payer: Self-pay | Admitting: Cardiology

## 2014-01-05 DIAGNOSIS — G8929 Other chronic pain: Secondary | ICD-10-CM | POA: Diagnosis not present

## 2014-01-05 DIAGNOSIS — D509 Iron deficiency anemia, unspecified: Secondary | ICD-10-CM | POA: Insufficient documentation

## 2014-01-05 DIAGNOSIS — R062 Wheezing: Secondary | ICD-10-CM | POA: Insufficient documentation

## 2014-01-05 DIAGNOSIS — I252 Old myocardial infarction: Secondary | ICD-10-CM | POA: Diagnosis not present

## 2014-01-05 DIAGNOSIS — M7989 Other specified soft tissue disorders: Secondary | ICD-10-CM | POA: Insufficient documentation

## 2014-01-05 DIAGNOSIS — Z720 Tobacco use: Secondary | ICD-10-CM | POA: Insufficient documentation

## 2014-01-05 DIAGNOSIS — M1990 Unspecified osteoarthritis, unspecified site: Secondary | ICD-10-CM | POA: Insufficient documentation

## 2014-01-05 DIAGNOSIS — Z9089 Acquired absence of other organs: Secondary | ICD-10-CM | POA: Diagnosis not present

## 2014-01-05 DIAGNOSIS — Z7982 Long term (current) use of aspirin: Secondary | ICD-10-CM | POA: Insufficient documentation

## 2014-01-05 DIAGNOSIS — I10 Essential (primary) hypertension: Secondary | ICD-10-CM | POA: Diagnosis not present

## 2014-01-05 DIAGNOSIS — K219 Gastro-esophageal reflux disease without esophagitis: Secondary | ICD-10-CM | POA: Insufficient documentation

## 2014-01-05 DIAGNOSIS — I739 Peripheral vascular disease, unspecified: Secondary | ICD-10-CM

## 2014-01-05 DIAGNOSIS — Z8659 Personal history of other mental and behavioral disorders: Secondary | ICD-10-CM | POA: Diagnosis not present

## 2014-01-05 DIAGNOSIS — I2510 Atherosclerotic heart disease of native coronary artery without angina pectoris: Secondary | ICD-10-CM | POA: Diagnosis not present

## 2014-01-05 DIAGNOSIS — Z8701 Personal history of pneumonia (recurrent): Secondary | ICD-10-CM | POA: Insufficient documentation

## 2014-01-05 DIAGNOSIS — R110 Nausea: Secondary | ICD-10-CM | POA: Diagnosis not present

## 2014-01-05 DIAGNOSIS — Z79899 Other long term (current) drug therapy: Secondary | ICD-10-CM | POA: Diagnosis not present

## 2014-01-05 DIAGNOSIS — R1012 Left upper quadrant pain: Secondary | ICD-10-CM | POA: Diagnosis present

## 2014-01-05 DIAGNOSIS — Z9861 Coronary angioplasty status: Secondary | ICD-10-CM | POA: Insufficient documentation

## 2014-01-05 DIAGNOSIS — J449 Chronic obstructive pulmonary disease, unspecified: Secondary | ICD-10-CM | POA: Diagnosis not present

## 2014-01-05 DIAGNOSIS — I480 Paroxysmal atrial fibrillation: Secondary | ICD-10-CM | POA: Insufficient documentation

## 2014-01-05 DIAGNOSIS — I70211 Atherosclerosis of native arteries of extremities with intermittent claudication, right leg: Secondary | ICD-10-CM | POA: Diagnosis not present

## 2014-01-05 DIAGNOSIS — R001 Bradycardia, unspecified: Secondary | ICD-10-CM | POA: Diagnosis present

## 2014-01-05 DIAGNOSIS — Z8719 Personal history of other diseases of the digestive system: Secondary | ICD-10-CM

## 2014-01-05 DIAGNOSIS — Z85118 Personal history of other malignant neoplasm of bronchus and lung: Secondary | ICD-10-CM | POA: Insufficient documentation

## 2014-01-05 LAB — COMPREHENSIVE METABOLIC PANEL
ALBUMIN: 3.6 g/dL (ref 3.5–5.2)
ALK PHOS: 69 U/L (ref 39–117)
ALT: 13 U/L (ref 0–53)
AST: 20 U/L (ref 0–37)
Anion gap: 13 (ref 5–15)
BUN: 9 mg/dL (ref 6–23)
CALCIUM: 10.4 mg/dL (ref 8.4–10.5)
CO2: 27 mEq/L (ref 19–32)
CREATININE: 0.72 mg/dL (ref 0.50–1.35)
Chloride: 93 mEq/L — ABNORMAL LOW (ref 96–112)
GFR calc non Af Amer: 87 mL/min — ABNORMAL LOW (ref >90)
Glucose, Bld: 119 mg/dL — ABNORMAL HIGH (ref 70–99)
POTASSIUM: 4.5 meq/L (ref 3.7–5.3)
Sodium: 133 mEq/L — ABNORMAL LOW (ref 137–147)
TOTAL PROTEIN: 7.3 g/dL (ref 6.0–8.3)
Total Bilirubin: 0.3 mg/dL (ref 0.3–1.2)

## 2014-01-05 LAB — CBC
HCT: 30.9 % — ABNORMAL LOW (ref 39.0–52.0)
HEMOGLOBIN: 9.8 g/dL — AB (ref 13.0–17.0)
MCH: 28.1 pg (ref 26.0–34.0)
MCHC: 31.7 g/dL (ref 30.0–36.0)
MCV: 88.5 fL (ref 78.0–100.0)
PLATELETS: 219 10*3/uL (ref 150–400)
RBC: 3.49 MIL/uL — AB (ref 4.22–5.81)
RDW: 23 % — ABNORMAL HIGH (ref 11.5–15.5)
WBC: 6.5 10*3/uL (ref 4.0–10.5)

## 2014-01-05 LAB — BASIC METABOLIC PANEL
Anion gap: 10 (ref 5–15)
BUN: 6 mg/dL (ref 6–23)
CHLORIDE: 100 meq/L (ref 96–112)
CO2: 28 mEq/L (ref 19–32)
Calcium: 9.4 mg/dL (ref 8.4–10.5)
Creatinine, Ser: 0.82 mg/dL (ref 0.50–1.35)
GFR calc Af Amer: >90 mL/min (ref >90)
GFR, EST NON AFRICAN AMERICAN: 83 mL/min — AB (ref >90)
GLUCOSE: 107 mg/dL — AB (ref 70–99)
POTASSIUM: 4.5 meq/L (ref 3.7–5.3)
SODIUM: 138 meq/L (ref 137–147)

## 2014-01-05 LAB — TROPONIN I: Troponin I: <0.3 ng/mL (ref <0.30)

## 2014-01-05 MED ORDER — ASPIRIN 81 MG PO CHEW
81.0000 mg | CHEWABLE_TABLET | Freq: Every day | ORAL | Status: DC
Start: 2014-01-05 — End: 2015-02-15

## 2014-01-05 MED ORDER — ACETAMINOPHEN 325 MG PO TABS: 650.0000 mg | ORAL_TABLET | ORAL | Status: AC | PRN

## 2014-01-05 MED ORDER — ASPIRIN 81 MG PO CHEW
81.0000 mg | CHEWABLE_TABLET | Freq: Every day | ORAL | Status: DC
  Administered 2014-01-05: 10:00:00 81 mg via ORAL
  Filled 2014-01-05: qty 1

## 2014-01-05 MED ORDER — AMIODARONE HCL 100 MG PO TABS
100.0000 mg | ORAL_TABLET | Freq: Every day | ORAL | Status: DC
  Administered 2014-01-05: 100 mg via ORAL

## 2014-01-05 MED ORDER — AMIODARONE HCL 200 MG PO TABS: 100.0000 mg | ORAL_TABLET | Freq: Every day | ORAL | Status: DC

## 2014-01-05 MED ORDER — CLOPIDOGREL BISULFATE 75 MG PO TABS: 75.0000 mg | ORAL_TABLET | Freq: Every day | ORAL | Status: DC

## 2014-01-05 NOTE — Progress Notes (Addendum)
At 1245 --- Pt ambulated in hallway with son and nurse Anderson Malta, pt was able to stand up from sitting position independently. Pt stated he holds onto furniture at home to walk around the house. Walked in hallway with son and nurse at his sides and he held their hands.  Tolerated walk. Pt states he is like he is at home, at his baseline holding onto things and feels he is ready to go home.    At 71--- Son states they have arranged for someone to stay with him at home. Kerin Ransom, [place] was notified by Christia Reading and orders were placed to discharge pt home.   At 47 --- Son wants to talk with his sister and make sure she is comfortable with "dad" going home, they are thinking pt may need to stay another day because his blood pressure was low yesterday.  Son waiting for his sister to call him back. Advised son that the MD is the one who ultimately decides if the pt needs to stay in the hospital. Advised that pt's BP has been normal today.  Waiting for son to speak with his sister.

## 2014-01-05 NOTE — Progress Notes (Signed)
    Subjective:  No complaints  Objective:  Vital Signs in the last 24 hours: Temp:  [97.4 F (36.3 C)-98.3 F (36.8 C)] 98.3 F (36.8 C) (11/13 0811) Pulse Rate:  [29-56] 56 (11/13 0811) Resp:  [16-18] 18 (11/13 0811) BP: (66-172)/(28-63) 134/49 mmHg (11/13 0811) SpO2:  [79 %-100 %] 100 % (11/13 0811) Weight:  [192 lb 0.3 oz (87.1 kg)] 192 lb 0.3 oz (87.1 kg) (11/13 0400)  Intake/Output from previous day:  Intake/Output Summary (Last 24 hours) at 01/05/14 0951 Last data filed at 01/05/14 0843  Gross per 24 hour  Intake   1080 ml  Output   2200 ml  Net  -1120 ml    Physical Exam: General appearance: alert, cooperative and no distress Lungs: clear to auscultation bilaterally Heart: regular rate and rhythm Extremities: no hematoma   Rate: 56  Rhythm: sinus bradycardia  Lab Results:  Recent Labs  01/04/14 1445 01/05/14 0115  WBC  --  6.5  HGB 10.8* 9.8*  PLT  --  219    Recent Labs  01/05/14 0115  NA 138  K 4.5  CL 100  CO2 28  GLUCOSE 107*  BUN 6  CREATININE 0.82    Recent Labs  01/04/14 1910 01/05/14 0115  TROPONINI <0.30 <0.30   No results for input(s): INR in the last 72 hours.  Imaging: Imaging results have been reviewed  Cardiac Studies:  Assessment/Plan:   Principal Problem:   Claudication Active Problems:   PAD-Lt SFA PTA 01/04/14   CAD- CFX PCI '06, CTO RCA, distal CFX   HTN (hypertension)   Cardiomyopathy, ischemic-EF 30-35%   Lung cancer   Paroxysmal a-fib- NSR on Amiodarone   COPD (chronic obstructive pulmonary disease)   H/O: GI bleed- Sept 2015    PLAN: OP f/u dopplers and OV with Dr [name]. His HR is 45-55, will decrease Amiodarone to 100 mg daily.   Kerin Ransom PA-C Beeper [phone] 01/05/2014, 9:51 AM    Patient seen and examined. Agree with assessment and plan. Mild nausea this am. No chest pain. Agree with decrease amiodarone dose decrease to 100 mg.  S/P PV intervention; groin site stable. DC  today   Troy Sine, MD, [facility] 01/05/2014 10:03 AM

## 2014-01-05 NOTE — Progress Notes (Signed)
Pt's family agreed that they can care for pt at home and that he will do better at his own home. Ready to take pt home. D/c instructions reviewed with pt, son and dtr-n-law. Copy of instructions given to pt.  Pt d/c'd via wheelchair with belongings with family.

## 2014-01-05 NOTE — ED Notes (Signed)
Pt had stent placed in left thigh yesterday, pt was discharged from hospital today around 1500. Pt reports abdominal pain and distention. Denies bruising to left thigh and abdomen. Last [facility] Wednesday. Pt reports chronic constipation. Pt states he has to take OTC stool softeners for his chronic constipation. Pt reports some nausea, denies vomiting.

## 2014-01-05 NOTE — Care Management Note (Addendum)
  Page 1 of 1   01/05/2014     2:26:13 PM CARE MANAGEMENT NOTE 01/05/2014  Patient:  [name], [name]   Account Number:  [id number]  Date Initiated:  01/05/2014  Documentation initiated by:  Mariann Laster  Subjective/Objective Assessment:   PVD     Action/Plan:   CM to follow for disposition needs   Anticipated DC Date:  01/05/2014   Anticipated DC Plan:  HOME/SELF CARE         Choice offered to / List presented to:             Status of service:  Completed, signed off Medicare Important Message given?   (If response is "NO", the following Medicare IM given date fields will be blank) Date Medicare IM given:   Medicare IM given by:   Date Additional Medicare IM given:   Additional Medicare IM given by:    Discharge Disposition:    Per UR Regulation:    If discussed at Long Length of Stay Meetings, dates discussed:    Comments:  Aireonna Bauer RN, BSN, MSHL, CCM  Nurse - Case Manager,  (Unit [phone]  01/04/2014 Med Review:  Plavix Home / Self care.

## 2014-01-05 NOTE — Discharge Instructions (Signed)
Coronary Angiogram With Stent, Care After °Refer to this sheet in the next few weeks. These instructions provide you with information on caring for yourself after your procedure. Your health care provider may also give you more specific instructions. Your treatment has been planned according to current medical practices, but problems sometimes occur. Call your health care provider if you have any problems or questions after your procedure.  °WHAT TO EXPECT AFTER THE PROCEDURE  °The insertion site may be tender for a few days after your procedure. °HOME CARE INSTRUCTIONS  °· Take medicines only as directed by your health care provider. Blood thinners may be prescribed after your procedure to improve blood flow through the stent. °· Change any bandages (dressings) as directed by your health care provider.   °· Check your insertion site every day for redness, swelling, or fluid leaking from the insertion.   °· Do not take baths, swim, or use a hot tub until your health care provider approves. You may shower. Pat the insertion area dry. Do not rub the insertion area with a washcloth or towel.   °· Eat a heart-healthy diet. This should include plenty of fresh fruits and vegetables. Meat should be lean cuts. Avoid the following types of food:   °¨ Food that is high in salt.   °¨ Canned or highly processed food.   °¨ Food that is high in saturated fat or sugar.   °¨ Fried food.   °· Make any other lifestyle changes recommended by your health care provider. This may include:   °¨ Not using any tobacco products including cigarettes, chewing tobacco, or electronic cigarettes.  °¨ Managing your weight.   °¨ Getting regular exercise.   °¨ Managing your blood pressure.   °¨ Limiting your alcohol intake.   °¨ Managing other health problems, such as diabetes.   °· If you need an MRI after your heart stent was placed, be sure to tell the health care provider who orders the MRI that you have a heart stent.   °· Keep all follow-up  visits as directed by your health care provider.   °SEEK IMMEDIATE MEDICAL CARE IF:  °· You develop chest pain, shortness of breath, feel faint, or pass out. °· You have bleeding, swelling larger than a walnut, or drainage from the catheter insertion site. °· You develop pain, discoloration, coldness, or severe bruising in the leg or arm that held the catheter. °· You develop bleeding from any other place such as from the bowels. There may be bright red blood in the urine or stools, or it may appear as black, tarry stools. °· You have a fever or chills. °MAKE SURE YOU: °· Understand these instructions. °· Will watch your condition. °· Will get help right away if you are not doing well or get worse. °Document Released: 08/29/2004 Document Revised: 06/26/2013 Document Reviewed: 07/13/2012 °ExitCare® Patient Information ©2015 [employer]. This information is not intended to replace advice given to you by your health care provider. Make sure you discuss any questions you have with your health care provider. ° °

## 2014-01-05 NOTE — Discharge Summary (Signed)
Patient ID: [name],  MRN: [id number], DOB/AGE: [birth year] 78 y.o.  Admit date: 01/04/2014 Discharge date: 01/05/2014  Primary Care Provider: Curly Rim, MD Primary Cardiologist: Dr [name]  Discharge Diagnoses Principal Problem:   Claudication Active Problems:   PAD-Lt SFA PTA 01/04/14   CAD- CFX PCI '06, CTO RCA, distal CFX   HTN (hypertension)   Cardiomyopathy, ischemic-EF 30-35%   Bradycardia- HR 45-55   Lung cancer   Paroxysmal a-fib- NSR on Amiodarone   COPD (chronic obstructive pulmonary disease)   H/O: GI bleed- Sept 2015    Procedures: Lt SFA PTA 01/04/14   Hospital Course:  80 y.o. male with History of CAD with prior DES to LCX in 2006, repeat cath in 2011 and in June of 2014. He has chronic occlusion of the mid RCA and of the distal circumflex - managed medically, ischemic CM with EF 30 to 35%, ongoing tobacco abuse, SVT - on amiodarone, COPD, history of lung cancer and HTN. He has had a recent GI bleed in September 2015. He is unsteady and lives alone. He is not on Coumadin.  He was seen Dr. [name] for [facility] evaluation. He underwent a PV angiogram on 11/20/13 and underwent successful restenting of the previously placed right common iliac artery stent. He was also noted to have high grade, subtotal/total calcified SFAs bilaterally with 2 vessel runoff which Dr. [name] thought could be best treated with orbital rotational arthrectomy +- PTA/stenting. Followup arterial Dopplers revealed decreased velocities in the right common iliac artery from 469-319 cm/s. ABIs are unchanged as expected. Patient was recently seen by [name], nurse practitioner for lower extremity edema. She put him on prn Lasix 20 mg. He is also taking iron 3 times a day for anemia. His hemoglobin is stable at 9.7. His right leg claudication improved but he still has bilateral claudication left greater than right. He presents now for staged left SFA diamondback  orbital rotation atherectomy plus or minus PTCA and stenting. On 11.12.15 he had successful diamondback orbital rotation atherectomy, using distal protection, and PTA and stenting using long overlapping covered stents to a long calcified chronic total Lt SFA occlusion. Post op he had transient hypotension felt to be vagal. He was noted to be bradycardic with HR 45-55. On the morning of the 13 th he complained of nausea and Lt thigh pain.There was no hematoma or ecchymosis noted.  He was kept till after lunch. We decreased his Amiodarone to 100 mg daily. Discussion was had with the pt's daughter. The pt lives alone, [facility]. He will not consider assisted living. He can make it to the bathroom by himself but is unsteady. His family will stay with him after discharge for the short term. The office will contact him for follow up dopplers and an OV with Dr [name] or Tarri Fuller.   Discharge Vitals:  Blood pressure 147/51, pulse 58, temperature 97.7 F (36.5 C), temperature source Oral, resp. rate 18, height 6\' 3"  (1.905 m), weight 192 lb 0.3 oz (87.1 kg), SpO2 100 %.    Labs: Results for orders placed or performed during the hospital encounter of 01/04/14 (from the past 24 hour(s))  POCT Activated clotting time     Status: None   Collection Time: 01/04/14  1:45 PM  Result Value Ref Range   Activated Clotting Time 157 seconds  Hemoglobin and hematocrit, blood     Status: Abnormal   Collection Time: 01/04/14  2:45 PM  Result  Value Ref Range   Hemoglobin 10.8 (L) 13.0 - 17.0 g/dL   HCT 33.7 (L) 39.0 - 52.0 %  Troponin I     Status: None   Collection Time: 01/04/14  2:45 PM  Result Value Ref Range   Troponin I <0.30 <0.30 ng/mL  Troponin I     Status: None   Collection Time: 01/04/14  7:10 PM  Result Value Ref Range   Troponin I <0.30 <0.30 ng/mL  Troponin I     Status: None   Collection Time: 01/05/14  1:15 AM  Result Value Ref Range   Troponin I <0.30 <0.30 ng/mL  Basic  metabolic panel      Status: Abnormal   Collection Time: 01/05/14  1:15 AM  Result Value Ref Range   Sodium 138 137 - 147 mEq/L   Potassium 4.5 3.7 - 5.3 mEq/L   Chloride 100 96 - 112 mEq/L   CO2 28 19 - 32 mEq/L   Glucose, Bld 107 (H) 70 - 99 mg/dL   BUN 6 6 - 23 mg/dL   Creatinine, Ser 0.82 0.50 - 1.35 mg/dL   Calcium 9.4 8.4 - 10.5 mg/dL   GFR calc non Af Amer 83 (L) >90 mL/min   GFR calc Af Amer >90 >90 mL/min   Anion gap 10 5 - 15  CBC     Status: Abnormal   Collection Time: 01/05/14  1:15 AM  Result Value Ref Range   WBC 6.5 4.0 - 10.5 K/uL   RBC 3.49 (L) 4.22 - 5.81 MIL/uL   Hemoglobin 9.8 (L) 13.0 - 17.0 g/dL   HCT 30.9 (L) 39.0 - 52.0 %   MCV 88.5 78.0 - 100.0 fL   MCH 28.1 26.0 - 34.0 pg   MCHC 31.7 30.0 - 36.0 g/dL   RDW 23.0 (H) 11.5 - 15.5 %   Platelets 219 150 - 400 K/uL    Disposition:      Follow-up Information    Follow up with Lorretta Harp, MD.   Specialty:  Cardiology   Why:  office will call you   Contact information:   [address] [place] [address] [phone]       Discharge Medications:    Medication List    STOP taking these medications        aspirin 325 MG EC tablet  Replaced by:  aspirin 81 MG chewable tablet      TAKE these medications        acetaminophen 325 MG tablet  Commonly known as:  TYLENOL  Take 2 tablets (650 mg total) by mouth every 4 (four) hours as needed for headache or mild pain.     albuterol 108 (90 BASE) MCG/ACT inhaler  Commonly known as:  PROVENTIL HFA;VENTOLIN HFA  Inhale 2 puffs into the lungs every 4 (four) hours as needed for wheezing or shortness of breath.     amiodarone 200 MG tablet  Commonly known as:  PACERONE  Take 0.5 tablets (100 mg total) by mouth daily.     amLODipine 10 MG tablet  Commonly known as:  NORVASC  Take 0.5 tablets (5 mg total) by mouth daily.     aspirin 81 MG chewable tablet  Chew 1 tablet (81 mg total) by mouth daily.     clopidogrel 75 MG  tablet  Commonly known as:  PLAVIX  Take 1 tablet (75 mg total) by mouth daily.     ferrous sulfate 325 (65 FE) MG tablet  Take 325 mg by mouth 3 (three) times daily with meals.     fexofenadine 180 MG tablet  Commonly known as:  ALLEGRA  Take 180 mg by mouth daily as needed (allergies).     furosemide 20 MG tablet  Commonly known as:  LASIX  Take 20 mg by mouth daily as needed. Daily as needed for swelling     isosorbide mononitrate 60 MG 24 hr tablet  Commonly known as:  IMDUR  Take 60 mg by mouth at bedtime.     losartan 100 MG tablet  Commonly known as:  COZAAR  Take 100 mg by mouth daily.     nitroGLYCERIN 0.4 MG SL tablet  Commonly known as:  NITROSTAT  Place 1 tablet (0.4 mg total) under the tongue every 5 (five) minutes as needed for chest pain (Chest pain).     pantoprazole 40 MG tablet  Commonly known as:  PROTONIX  Take 40 mg by mouth daily.     polyethylene glycol packet  Commonly known as:  MIRALAX / GLYCOLAX  Take 17 g by mouth every other day.         Duration of Discharge Encounter: Greater than 30 minutes including physician time.  Angelena Form PA-C 01/05/2014 1:10 PM

## 2014-01-05 NOTE — Progress Notes (Signed)
Pt nauseated earlier. Low B/P last Pm, felt to be vagal. Will watch till after lunch. He can go when he can go to the bathroom by himself, he lives alone, Dtr lives nearby.  Kerin Ransom PA-C 01/05/2014 10:43 AM

## 2014-01-06 ENCOUNTER — Encounter (HOSPITAL_COMMUNITY): Payer: Self-pay

## 2014-01-06 ENCOUNTER — Emergency Department (HOSPITAL_COMMUNITY): Payer: Medicare Other

## 2014-01-06 ENCOUNTER — Emergency Department (HOSPITAL_COMMUNITY)
Admission: EM | Admit: 2014-01-06 | Discharge: 2014-01-06 | Disposition: A | Discharge status: Home / Self Care | Payer: Medicare Other | Attending: Emergency Medicine | Admitting: Emergency Medicine

## 2014-01-06 DIAGNOSIS — R109 Unspecified abdominal pain: Secondary | ICD-10-CM

## 2014-01-06 DIAGNOSIS — I739 Peripheral vascular disease, unspecified: Secondary | ICD-10-CM

## 2014-01-06 LAB — CBC WITH DIFFERENTIAL/PLATELET
BASOS ABS: 0 10*3/uL (ref 0.0–0.1)
Basophils Relative: 0 % (ref 0–1)
EOS ABS: 0.1 10*3/uL (ref 0.0–0.7)
Eosinophils Relative: 1 % (ref 0–5)
HEMATOCRIT: 35.3 % — AB (ref 39.0–52.0)
Hemoglobin: 11.3 g/dL — ABNORMAL LOW (ref 13.0–17.0)
LYMPHS PCT: 9 % — AB (ref 12–46)
Lymphs Abs: 1 10*3/uL (ref 0.7–4.0)
MCH: 29.2 pg (ref 26.0–34.0)
MCHC: 32 g/dL (ref 30.0–36.0)
MCV: 91.2 fL (ref 78.0–100.0)
MONO ABS: 1.1 10*3/uL — AB (ref 0.1–1.0)
Monocytes Relative: 10 % (ref 3–12)
NEUTROS ABS: 9.2 10*3/uL — AB (ref 1.7–7.7)
Neutrophils Relative %: 80 % — ABNORMAL HIGH (ref 43–77)
Platelets: 239 10*3/uL (ref 150–400)
RBC: 3.87 MIL/uL — ABNORMAL LOW (ref 4.22–5.81)
RDW: 22.5 % — ABNORMAL HIGH (ref 11.5–15.5)
WBC: 11.4 10*3/uL — ABNORMAL HIGH (ref 4.0–10.5)

## 2014-01-06 LAB — URINALYSIS, ROUTINE W REFLEX MICROSCOPIC
Bilirubin Urine: NEGATIVE
Glucose, UA: NEGATIVE mg/dL
Hgb urine dipstick: NEGATIVE
Ketones, ur: NEGATIVE mg/dL
LEUKOCYTES UA: NEGATIVE
Nitrite: NEGATIVE
PH: 6.5 (ref 5.0–8.0)
Protein, ur: NEGATIVE mg/dL
SPECIFIC GRAVITY, URINE: 1.013 (ref 1.005–1.030)
Urobilinogen, UA: 1 mg/dL (ref 0.0–1.0)

## 2014-01-06 LAB — LIPASE, BLOOD: LIPASE: 18 U/L (ref 11–59)

## 2014-01-06 LAB — I-STAT CG4 LACTIC ACID, ED: LACTIC ACID, VENOUS: 1.38 mmol/L (ref 0.5–2.2)

## 2014-01-06 MED ORDER — FAMOTIDINE IN NACL 20-0.9 MG/50ML-% IV SOLN
20.0000 mg | Freq: Once | INTRAVENOUS | Status: DC
  Filled 2014-01-06: qty 50

## 2014-01-06 MED ORDER — ONDANSETRON HCL 4 MG/2ML IJ SOLN
4.0000 mg | Freq: Once | INTRAMUSCULAR | Status: AC
  Administered 2014-01-06: 4 mg via INTRAVENOUS
  Filled 2014-01-06: qty 2

## 2014-01-06 MED ORDER — MORPHINE SULFATE 4 MG/ML IJ SOLN
4.0000 mg | Freq: Once | INTRAMUSCULAR | Status: AC
  Administered 2014-01-06: 4 mg via INTRAVENOUS
  Filled 2014-01-06: qty 1

## 2014-01-06 MED ORDER — SODIUM CHLORIDE 0.9 % IV BOLUS (SEPSIS)
1000.0000 mL | Freq: Once | INTRAVENOUS | Status: AC
  Administered 2014-01-06: 1000 mL via INTRAVENOUS

## 2014-01-06 MED ORDER — IPRATROPIUM BROMIDE 0.02 % IN SOLN
1.0000 mg | Freq: Once | RESPIRATORY_TRACT | Status: AC
  Administered 2014-01-06: 1 mg via RESPIRATORY_TRACT
  Filled 2014-01-06: qty 5

## 2014-01-06 MED ORDER — IOHEXOL 350 MG/ML SOLN
100.0000 mL | Freq: Once | INTRAVENOUS | Status: AC | PRN
  Administered 2014-01-06: 100 mL via INTRAVENOUS

## 2014-01-06 MED ORDER — IOHEXOL 300 MG/ML  SOLN
25.0000 mL | Freq: Once | INTRAMUSCULAR | Status: AC | PRN
  Administered 2014-01-06: 25 mL via ORAL

## 2014-01-06 MED ORDER — ALBUTEROL (5 MG/ML) CONTINUOUS INHALATION SOLN
10.0000 mg/h | INHALATION_SOLUTION | RESPIRATORY_TRACT | Status: DC
  Administered 2014-01-06: 10 mg/h via RESPIRATORY_TRACT
  Filled 2014-01-06: qty 20

## 2014-01-06 NOTE — ED Notes (Signed)
Patient returned from CT

## 2014-01-06 NOTE — ED Provider Notes (Signed)
CSN: [id number]     Arrival date & time 01/05/14  2301 History   First MD Initiated Contact with Patient 01/06/14 0140     Chief Complaint  Patient presents with  . Abdominal Pain     (Consider location/radiation/quality/duration/timing/severity/associated sxs/prior Treatment) HPI  IORI GIGANTE is a 80 y.o. male with past medical history of coronary artery disease, severe peripheral vascular disease status post recent stenting of left iliac, presenting with abdominal pain. Patient was discharged yesterday November 13 after surgery. He states he had abdominal pain at the time of discharge. It has progressively gotten worse throughout the day. It is located in the left upper quadrant and radiates to his left flank. It is a gaseous feeling as if he needs to have a bowel movement. The pain intermittently gets severe. He denies any dysuria, hematuria, or history of kidney stones. He has been nauseous without vomiting. His last bowel movement was November 11 and he does take stool softeners.  Patient attempted to take a GI cocktail, and reflux medicine at home without relief.  Patient is also complaining of pain in his left thigh and swelling of the left lower extremity. Patient states he can longer stand, before the surgery was able to walk around. He denies any fevers. Patient has no further complaints.  10 Systems reviewed and are negative for acute change except as noted in the HPI.    Past Medical History  Diagnosis Date  . CAD (coronary artery disease)     a. s/p DES-LCx 2006 b. repeat cath 2011- 90% distal LCx ISR, 100% mid RCA CTO, diffuse nonobstructive dz, medical management c. 07/2012 cath- 100% distal LCx ISR, 100% mid RCA CTO, nonobstructive dz; EF 40-45%  . PVC (premature ventricular contraction)   . HTN (hypertension)   . Paroxysmal a-fib   . PAD (peripheral artery disease)     history of stenting in the past  . COPD (chronic obstructive pulmonary disease)   . LV dysfunction   .  Anemia, iron deficiency   . Family history of anesthesia complication     "daughter gets PONV" (11/20/2013)  . Anginal pain   . Pneumonia 1-2 X's  . History of blood transfusion     "he's had several; probably related to his cancer" (11/20/2013)  . GERD (gastroesophageal reflux disease)   . H/O hiatal hernia   . History of gastric ulcer   . Arthritis     "generalized"  . Chronic lower back pain   . Anxiety   . Myocardial infarction 09/2009; ~ 2014    [name] 10/24/2009;   . 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 Lobectomy Left     left upper  . Appendectomy    . Anterior cervical decomp/discectomy fusion    . Twenty four hour intraesophageal ph profile.  05/02/2001  . Aortogram with bilateral lower extremity runoff.  07/11/2003  . Fiberoptic bronchoscopy, mediastinum.  10/20/2005  . Video assisted thoracoscopy Left 12/01/2005  . Esophagogastroduodenoscopy with biopsy.  04/14/2010  . Iliac artery stent Right 11/20/2013  . Tonsillectomy  1972  . Inguinal hernia repair Bilateral     "I've had several"  . Back surgery      "I've had several back surgeries; got spacers down in lower back" (11/20/2013)  . Cataract extraction w/ intraocular lens  implant, bilateral Bilateral   . Coronary angioplasty with stent placement  2006    DES  to LCX/notes 01/04/2014  . Cardiac catheterization  2011; 07/2012    [name] 01/04/2014  . Femoral artery stent Left 01/04/2014   Family History  Problem Relation Age of Onset  . Heart Problems Mother   . Other Father     Motorcycle Accident   History  Substance Use Topics  . Smoking status: Current Every Day Smoker -- 1.00 packs/day for 65 years    Types: Cigarettes  . Smokeless tobacco: Never Used  . Alcohol Use: Yes     Comment: quit 40+ years ago    Review of Systems    Allergies  Doxycycline; Atorvastatin; and Sulfa drugs cross reactors  Home Medications   Prior to Admission  medications   Medication Sig Start Date End Date Taking? Authorizing Provider  acetaminophen (TYLENOL) 325 MG tablet Take 2 tablets (650 mg total) by mouth every 4 (four) hours as needed for headache or mild pain. 01/05/14   Erlene Quan, PA-C  albuterol (PROVENTIL HFA;VENTOLIN HFA) 108 (90 BASE) MCG/ACT inhaler Inhale 2 puffs into the lungs every 4 (four) hours as needed for wheezing or shortness of breath.    Historical Provider, MD  amiodarone (PACERONE) 200 MG tablet Take 0.5 tablets (100 mg total) by mouth daily. 01/05/14   Erlene Quan, PA-C  amLODipine (NORVASC) 10 MG tablet Take 0.5 tablets (5 mg total) by mouth daily. 11/29/13   Burtis Junes, NP  aspirin 81 MG chewable tablet Chew 1 tablet (81 mg total) by mouth daily. 01/05/14   Erlene Quan, PA-C  clopidogrel (PLAVIX) 75 MG tablet Take 1 tablet (75 mg total) by mouth daily. 01/05/14   Erlene Quan, PA-C  ferrous sulfate 325 (65 FE) MG tablet Take 325 mg by mouth 3 (three) times daily with meals. 11/13/13   Modena Jansky, MD  fexofenadine (ALLEGRA) 180 MG tablet Take 180 mg by mouth daily as needed (allergies).     Historical Provider, MD  furosemide (LASIX) 20 MG tablet Take 20 mg by mouth daily as needed. Daily as needed for swelling 11/29/13   Burtis Junes, NP  isosorbide mononitrate (IMDUR) 60 MG 24 hr tablet Take 60 mg by mouth at bedtime.    Historical Provider, MD  losartan (COZAAR) 100 MG tablet Take 100 mg by mouth daily.    Historical Provider, MD  nitroGLYCERIN (NITROSTAT) 0.4 MG SL tablet Place 1 tablet (0.4 mg total) under the tongue every 5 (five) minutes as needed for chest pain (Chest pain). 11/29/13   Burtis Junes, NP  pantoprazole (PROTONIX) 40 MG tablet Take 40 mg by mouth daily. 11/13/13   Modena Jansky, MD  polyethylene glycol (MIRALAX / GLYCOLAX) packet Take 17 g by mouth every other day.     Historical Provider, MD   BP 148/55 mmHg  Pulse 62  Temp(Src) 97.7 F (36.5 C) (Oral)  Resp 17  Ht 6\' 3"   (1.905 m)  Wt 193 lb (87.544 kg)  BMI 24.12 kg/m2  SpO2 94% Physical Exam  Constitutional: He is oriented to person, place, and time. Vital signs are normal. He appears well-developed and well-nourished.  Non-toxic appearance. He does not appear ill. No distress.  HENT:  Head: Normocephalic and atraumatic.  Nose: Nose normal.  Mouth/Throat: Oropharynx is clear and moist. No oropharyngeal exudate.  Eyes: Conjunctivae and EOM are normal. Pupils are equal, round, and reactive to light. No scleral icterus.  Neck: Normal range of motion. Neck supple. No tracheal deviation, no edema, no erythema and  normal range of motion present. No thyroid mass and no thyromegaly present.  Cardiovascular: Normal rate, regular rhythm, S1 normal, S2 normal, normal heart sounds, intact distal pulses and normal pulses.  Exam reveals no gallop and no friction rub.   No murmur heard. Pulses:      Radial pulses are 2+ on the right side, and 2+ on the left side.       Dorsalis pedis pulses are 2+ on the right side, and 2+ on the left side.  Pulmonary/Chest: Effort normal. No respiratory distress. He has wheezes. He has no rhonchi. He has no rales.  Bilateral expiratory wheezing heard.  Abdominal: Soft. Normal appearance and bowel sounds are normal. He exhibits mass. He exhibits no distension and no ascites. There is no hepatosplenomegaly. There is no tenderness. There is no rebound, no guarding and no CVA tenderness.  Periumbilical hernia palpated. Easily reducible.  Musculoskeletal: Normal range of motion. He exhibits edema. He exhibits no tenderness.  Bilateral lower extremity edema, 1+. Left lower extremity is more swollen than the right. There is no tenderness to palpation.  Lymphadenopathy:    He has no cervical adenopathy.  Neurological: He is alert and oriented to person, place, and time. He has normal strength. No cranial nerve deficit or sensory deficit. GCS eye subscore is 4. GCS verbal subscore is 5. GCS  motor subscore is 6.  Skin: Skin is warm, dry and intact. No petechiae and no rash noted. He is not diaphoretic. No erythema. No pallor.  Insertion site of right thigh is clean dry and intact. No swelling or hematoma.  Psychiatric: He has a normal mood and affect. His behavior is normal. Judgment normal.  Nursing note and vitals reviewed.   ED Course  Procedures (including critical care time) Labs Review Labs Reviewed  CBC WITH DIFFERENTIAL - Abnormal; Notable for the following:    WBC 11.4 (*)    RBC 3.87 (*)    Hemoglobin 11.3 (*)    HCT 35.3 (*)    RDW 22.5 (*)    Neutrophils Relative % 80 (*)    Lymphocytes Relative 9 (*)    Neutro Abs 9.2 (*)    Monocytes Absolute 1.1 (*)    All other components within normal limits  COMPREHENSIVE METABOLIC PANEL - Abnormal; Notable for the following:    Sodium 133 (*)    Chloride 93 (*)    Glucose, Bld 119 (*)    GFR calc non Af Amer 87 (*)    All other components within normal limits  URINALYSIS, ROUTINE W REFLEX MICROSCOPIC - Abnormal; Notable for the following:    APPearance CLOUDY (*)    All other components within normal limits  LIPASE, BLOOD  I-STAT CG4 LACTIC ACID, ED    Imaging Review Ct Angio Ao+bifem W/cm &/or Wo/cm  01/06/2014   ADDENDUM REPORT: 01/06/2014 07:23  ADDENDUM: Chronic left pleural effusion is also noted.   Electronically Signed   By: Inez Catalina M.D.   On: 01/06/2014 07:23   01/06/2014   CLINICAL DATA:  Left leg pain following stent placement with difficulty ambulating, initial encounter  EXAM: CT ANGIOGRAPHY OF ABDOMINAL AORTA WITH ILIOFEMORAL RUNOFF  TECHNIQUE: Multidetector CT imaging of the abdomen, pelvis and lower extremities was performed using the standard protocol during bolus administration of intravenous contrast. Multiplanar CT image reconstructions and MIPs were obtained to evaluate the vascular anatomy.  CONTRAST:  158mL OMNIPAQUE IOHEXOL 350 MG/ML SOLN  COMPARISON:  06/13/2013  FINDINGS: Aorta: The  aorta shows  evidence of calcified and noncalcified atherosclerotic plaque. No aneurysmal dilatation is noted. The origins of the mesenteric vessels are patent. Single renal arteries are identified bilaterally with mild atherosclerotic change.  Right Lower Extremity: The right common iliac artery stent is noted and appears to be patent although the caliber of the stent is small and difficult to evaluate even on the coronal reconstructions. The degree of opacification of external iliac artery on the right suggests at the stent is patent. Heavy calcific plaque is noted within the external iliac artery without focal stenosis. Approximately 50% stenosis of the common femoral artery on the right is noted related to posterior calcified plaque. High-grade stenosis of the superficial femoral artery is noted at its origin on the right. Multifocal areas of calcific plaque are noted throughout the superficial femoral artery on the right and the artery appears occluded throughout the majority of its course. There is reconstitution of the right popliteal artery identified via collaterals just above the knee joint. Heavily calcified plaque is noted. The popliteal trifurcation shows the PET tibioperoneal trunk to be patent although occlusion of the posterior tibial artery is noted at its origin. The peroneal artery is visualized to the level of the right ankle. The anterior tibial artery is identified to the level of the right ankle although more distal visualization is not appreciated on this exam.  Left Lower Extremity: Calcific changes are noted in the common and external iliac artery on the left without focal hemodynamically significant stenosis. Left superficial femoral artery stenting is noted. Caliber of the stent is small limiting internal evaluation somewhat. The stent does appear to be patent with runoff into the popliteal artery. Popliteal artery demonstrates calcific plaque but is otherwise patent. The popliteal  trifurcation is patent although the posterior tibial artery is heavily calcified and likely occluded proximally. The anterior tibial artery and peroneal artery are patent but demonstrates some calcific changes. There visualized to the level of the ankle. More distal visualization is not appreciated on this exam.  Review of the MIP images confirms the above findings.  Nonvascular: The liver, gallbladder, spleen, adrenal glands and pancreas are within normal limits. The kidneys are well visualized bilaterally and demonstrate vascular calcifications. Bilateral renal cysts are noted. The appendix is not visualized consistent prior surgical history. The bladder is well distended. The prostate is mildly prominent. No significant lymphadenopathy is seen. The osseous structures show postoperative changes in the lumbar spine. Multilevel degenerative changes are seen.  IMPRESSION: Patent left superficial femoral artery stent although evaluation of the stent is limited due to the small internal lumen size. Correlation with the popliteal pulse and distal pulses is recommended. Two vessel runoff to the left ankle is seen.  Changes consistent with occlusion of the right superficial femoral artery with reconstitution at the level of the mid popliteal artery on the right. Two-vessel runoff is noted to the right ankle. Proximal right superficial femoral artery stenosis is noted.  Patent right common iliac artery stent.  No acute nonvascular abnormality is noted.  Electronically Signed: By: Inez Catalina M.D. On: 01/06/2014 07:20     EKG Interpretation None      MDM   Final diagnoses:  Abdominal pain  Claudication of left lower extremity    Patient present to emergency department for abdominal pain and leg pain after recent stent placement. Will evaluate with CT angiogram to assess patency of grafts. Patient was given morphine for pain control and IV fluids.  Also breathing treatment for wheezing as he has  h/o COPD.   Pain has significantly improved  Dopplers were found in BLE.  CT above reveals severe disease throughout BLE but he is getting blood flow to his toes bilaterally.  His skin is warm to touch as well.  His main complaint is abdominal pain.  Labs are unremarkable and CT of abdomen does not reveal an acute cause.  PAtient may have passed a kidney stone.  HE was advised to continue pain meds, and fu with PCP and surgery doctor early next week for continued treatment.  His VS remain within his normal limits and he is safe for discharge.    Everlene Balls, MD 01/06/14 1350

## 2014-01-06 NOTE — ED Notes (Signed)
Patient transported to CT 

## 2014-01-06 NOTE — ED Notes (Signed)
Doppler pulses present in bilateral feet, stronger on the right than left.

## 2014-01-06 NOTE — ED Notes (Signed)
Patient had stent placed in left leg.  Patient has not been able to stand after stent placement.  Patient is complaining of pain in left leg, foot and toes are swollen.  Patient with good color, warm to touch, unable to palpate pulses.

## 2014-01-06 NOTE — ED Notes (Signed)
Pt is aware that urine is needed for testing, urinal at bedside.

## 2014-01-06 NOTE — Progress Notes (Signed)
RT in room to give nebulizer tx.  Patient in x-ray.

## 2014-01-06 NOTE — Discharge Instructions (Signed)
Abdominal Pain [name], you were seen today for abdominal pain. The CT scan of her abdomen did not show a cause for this. you need to follow-up with your regular physician Within 3 days for continued evaluation. Also follow-up with your surgeon regarding her graft in your leg. If any of your symptoms worsen come back to emergency department immediately for repeat evaluation. Thank you. Many things can cause belly (abdominal) pain. Most times, the belly pain is not dangerous. Many cases of belly pain can be watched and treated at home. HOME CARE   Do not take medicines that help you go poop (laxatives) unless told to by your doctor.  Only take medicine as told by your doctor.  Eat or drink as told by your doctor. Your doctor will tell you if you should be on a special diet. GET HELP IF:  You do not know what is causing your belly pain.  You have belly pain while you are sick to your stomach (nauseous) or have runny poop (diarrhea).  You have pain while you pee or poop.  Your belly pain wakes you up at night.  You have belly pain that gets worse or better when you eat.  You have belly pain that gets worse when you eat fatty foods.  You have a fever. GET HELP RIGHT AWAY IF:   The pain does not go away within 2 hours.  You keep throwing up (vomiting).  The pain changes and is only in the right or left part of the belly.  You have bloody or tarry looking poop. MAKE SURE YOU:   Understand these instructions.  Will watch your condition.  Will get help right away if you are not doing well or get worse. Document Released: 07/29/2007 Document Revised: 02/14/2013 Document Reviewed: 10/19/2012 [facility] Patient Information 2015 [place], [place]. This information is not intended to replace advice given to you by your health care provider. Make sure you discuss any questions you have with your health care provider.

## 2014-01-06 NOTE — ED Notes (Signed)
Patient with left flank pain, that was worse earlier today and then got better.  Patient's daughter stated that it has gotten progressively worse during the day.  Pain on palpation of his abdomen.

## 2014-01-08 ENCOUNTER — Telehealth: Payer: Self-pay | Admitting: Diagnostic Neuroimaging

## 2014-01-08 ENCOUNTER — Telehealth: Payer: Self-pay | Admitting: Medical Oncology

## 2014-01-08 NOTE — Telephone Encounter (Signed)
Spoke to daughter Levada Dy. Advised patient should come in for an OV. Daughter says the best time would be a morning. Offered 01/12/14 9:30 slot. Daughter said she would have to confirm with the patient this time is ok then call back. thanks

## 2014-01-08 NOTE — Telephone Encounter (Signed)
Patient's daughter [name] stated patient tremors has worsened.  Unable to hold a cup without spilling everything.  Offered several earlier appointments,but not able to take due to appointment conflicts.  Requesting if there's a Rx that could help with Tremors.  Please call and advise.

## 2014-01-08 NOTE — Telephone Encounter (Signed)
I told [name] that Dr Julien Nordmann needs CT chest and transferred her to radiology to get it scheduled to coincide same day as OV

## 2014-01-09 ENCOUNTER — Telehealth: Payer: Self-pay | Admitting: Cardiovascular Disease

## 2014-01-09 ENCOUNTER — Telehealth (HOSPITAL_COMMUNITY): Payer: Self-pay | Admitting: *Deleted

## 2014-01-09 NOTE — Telephone Encounter (Signed)
Called patient's daughter to possibly bring him in tomorrow (01/10/2014) at 10 am with Dr [name]. Also attempted to notify patient of appointment but could not reach him.  Advised patient's daughter if this appointment did not work for him, tomorrow, to call us back in the morning.

## 2014-01-09 NOTE — Telephone Encounter (Signed)
Spoke with pt's daughter. She reports pt was discharged from the hospital on Friday. Was seen back in ED on Friday evening for abdominal pain. CT was OK.  Pt also had left leg pain and swelling in ED and was told to call Dr. Kennon Holter office for follow up on this.  Daughter reports pt is continuing to have pain in left hip and leg. Swelling in left leg.  She is not with pt and does not know if foot warm or discolored. Pt did take furosemide 20 mg this AM (has ordered prn for swelling). Will forward to Dr. [name] for recommendations

## 2014-01-09 NOTE — Telephone Encounter (Signed)
Pt had a procedure last Thursday. He is still having a lot of pain,swelling in his foot and ankle. Today he is running slight fever.

## 2014-01-10 ENCOUNTER — Telehealth: Payer: Self-pay | Admitting: *Deleted

## 2014-01-10 ENCOUNTER — Telehealth (HOSPITAL_COMMUNITY): Payer: Self-pay | Admitting: *Deleted

## 2014-01-10 NOTE — Telephone Encounter (Signed)
Spoke to the pts daughter. She said she has not physically seen her father today but that she has spoken to him. He stated that he still has swelling in feet and legs. That he "feels terrible". That he is having "back, belly, and hip pain". Ms. Jeanette Caprice (Pt's daughter) said that pt normal temperature is 97.5 F. When the pt has taken his temperature it has been 98.7, which for him is a "low grade fever" per pt and daughter.   Advised pt's daughter to give [place] a call back if pt's temperature is 100.0 F or greater. She voiced understanding. Reminded her of appt on Friday with Dr. [name].

## 2014-01-10 NOTE — Telephone Encounter (Signed)
Spoke with daughter and offered an appmt before 1130am today - she cannot bring her dad as she as at work in [employer] and he lives in [place]. Curt Bears, RN offered to double book Dr. [name] for 11/20 @2pm  after patient's 1130am LEA doppler. Communicated this appmt to daughter. She thanked [place] and voiced understanding of appmt date and time.

## 2014-01-10 NOTE — Telephone Encounter (Signed)
[name] is returning [employer]

## 2014-01-10 NOTE — Telephone Encounter (Signed)
Pt has an appt for a doppler on Friday at 11:30. Per pts daughter he has some swelling in his foot post procedure and would like to be seen by the doctor either tomorrow or Friday.  Please call

## 2014-01-12 ENCOUNTER — Ambulatory Visit (HOSPITAL_COMMUNITY)
Admission: RE | Admit: 2014-01-12 | Discharge: 2014-01-12 | Disposition: A | Discharge status: Home / Self Care | Payer: Medicare Other | Source: Ambulatory Visit | Attending: Cardiology | Admitting: Cardiology

## 2014-01-12 ENCOUNTER — Encounter: Payer: Self-pay | Admitting: Cardiovascular Disease

## 2014-01-12 ENCOUNTER — Ambulatory Visit (INDEPENDENT_AMBULATORY_CARE_PROVIDER_SITE_OTHER): Payer: Medicare Other | Admitting: Cardiovascular Disease

## 2014-01-12 VITALS — BP 148/62 | HR 68 | Temp 98.0°F | Ht 75.0 in | Wt 193.0 lb

## 2014-01-12 DIAGNOSIS — R0989 Other specified symptoms and signs involving the circulatory and respiratory systems: Secondary | ICD-10-CM

## 2014-01-12 DIAGNOSIS — I739 Peripheral vascular disease, unspecified: Secondary | ICD-10-CM | POA: Insufficient documentation

## 2014-01-12 DIAGNOSIS — R600 Localized edema: Secondary | ICD-10-CM

## 2014-01-12 DIAGNOSIS — Z79899 Other long term (current) drug therapy: Secondary | ICD-10-CM

## 2014-01-12 MED ORDER — AMOXICILLIN-POT CLAVULANATE 875-125 MG PO TABS: 1.0000 | ORAL_TABLET | Freq: Two times a day (BID) | ORAL | Status: DC

## 2014-01-12 MED ORDER — FUROSEMIDE 20 MG PO TABS: 20.0000 mg | ORAL_TABLET | Freq: Every day | ORAL | Status: DC | PRN

## 2014-01-12 NOTE — Assessment & Plan Note (Signed)
History of peripheral arterial disease status post left superficial femoral artery diamond back orbital rotational atherectomy, PTA and stenting using Viabahn covered stents by myself 01/04/14. He did have 2 vessel runoff with a dominant vessel being his anterior tibial and a diffusely diseased peroneal artery. His lower extremity artery Doppler study today revealed a left ABI of 1 with a widely patent stent. He was seen back in the ER the day after discharge with abdominal pain and CT angiogram of his abdomen and lower extremities was unremarkable. Postoperative swelling and some redness in his left lower extremity as well as some hip pain. On exam today he does have a right femoral bruit and palpable left pedal pulse with 1+ pitting edema and some cellulitis on the dorsal surface of his left foot. I'm going to get a duplex of his right groin to rule out pseudoaneurysm. I'm going to increase his Lasix from 20-40 mg a day to get rid of some of his edema for 2 weeks and we'll check a basic metabolic panel in 10 days. After which, we will decrease his Lasix to 20 mg a day. I'm also going to put him on some Augmentin twice a day for 10 days as well as recommend 20 mm knee-high compression stocking.

## 2014-01-12 NOTE — Patient Instructions (Signed)
  We will see you back in follow up in 3 weeks with Dr [name].   Dr [name] has ordered: 1. Increase lasix to 40mg  daily for 2 weeks, then go back to 20mg  daily  2. Start Augmentin one tablet twice a day for 10 days  3. Your physician recommends that you return for lab work in: 10 days (lab is on the first floor, [address])  4. Compression stockings to be worn on your left leg during the day (remove them at night).  Please elevate your legs when you are sitting.

## 2014-01-12 NOTE — Progress Notes (Signed)
Right Arterial Groin Duplex Completed. [place]

## 2014-01-12 NOTE — Progress Notes (Signed)
Left Lower Extremity Arterial Duplex Completed. °Brianna L Mazza,RVT °

## 2014-01-12 NOTE — Progress Notes (Signed)
01/12/2014 [name]   [birth year]  [id number]  Primary Physician Curly Rim, MD Primary Cardiologist: Lorretta Harp MD Renae Gloss  . HPI:  [name] is a 80 y.o. male with History of CAD with prior DES to LCX in 2006, repeat cath in 2011 and in June of 2014. He has chronic occlusion of the mid RCA and of the distal circumflex - managed medically, ischemic CM with EF 30 to 35%, ongoing tobacco abuse, SVT - on amiodarone, COPD, history of lung cancer and HTN. Has had recent GI bleed with colonoscopy/endoscopy performed in September 2015.  He was seen Dr. [name] for PV evaluation - underwent a PV angiogram on 11/20/13 and underwent successful restenting of ISR within the previously placed right common iliac artery stent. Discharged on DAPT. Also noted to have high grade, subtotal/total calcified SFAs bilaterally with 2 vessel runoff which Dr. [name] thought could be best treated with orbital rotational arthrectomy +- PTA/stenting. Followup arterial Dopplers revealed decreased callosities in the right common iliac artery from 469-319 cm/s. ABIs are unchanged as expected. Patient was recently seen by [name], nurse practitioner for lower extremity edema. She put him on when necessary Lasix 20 mg. He is also taking iron 3 times a day for anemia. Hemoglobin is stable at 9.7. He had percutaneous intervention of his right common iliac artery and at that time angiography documented occluded SFAs bilaterally. His right leg improved but he still has bilateral claudication left greater than right. On 01/04/14 he underwent diamondback orbital rotational atherectomy, PTA and Viabahn  covered stenting of his left SFA chronic total occlusion with excellent intravascular result. He went back to the ER the following day with abdominal pain. A CT angiogram showed no abdominal pathology with a widely patent stent. Lower extremity arterial Doppler studies  today reveal his stent to be widely patent with a normal ABI on the left. She does have some swelling of his left leg as well as some cellulitis on the dorsal aspect of his left foot.  Current Outpatient Prescriptions  Medication Sig Dispense Refill  . acetaminophen (TYLENOL) 325 MG tablet Take 2 tablets (650 mg total) by mouth every 4 (four) hours as needed for headache or mild pain.    Marland Kitchen albuterol (PROVENTIL HFA;VENTOLIN HFA) 108 (90 BASE) MCG/ACT inhaler Inhale 2 puffs into the lungs every 4 (four) hours as needed for wheezing or shortness of breath.    Marland Kitchen amiodarone (PACERONE) 200 MG tablet Take 0.5 tablets (100 mg total) by mouth daily.    Marland Kitchen amLODipine (NORVASC) 10 MG tablet Take 0.5 tablets (5 mg total) by mouth daily. 90 tablet 6  . aspirin 81 MG chewable tablet Chew 1 tablet (81 mg total) by mouth daily.    . clopidogrel (PLAVIX) 75 MG tablet Take 1 tablet (75 mg total) by mouth daily. 30 tablet 11  . diazepam (VALIUM) 2 MG tablet     . ferrous sulfate 325 (65 FE) MG tablet Take 325 mg by mouth 3 (three) times daily with meals.    . fexofenadine (ALLEGRA) 180 MG tablet Take 180 mg by mouth daily as needed (allergies).     . furosemide (LASIX) 20 MG tablet Take 20 mg by mouth daily as needed. Daily as needed for swelling    . isosorbide mononitrate (IMDUR) 60 MG 24 hr tablet Take 60 mg by mouth at bedtime.    Marland Kitchen losartan (COZAAR) 100 MG tablet Take 100 mg by mouth daily.    Marland Kitchen  nitroGLYCERIN (NITROSTAT) 0.4 MG SL tablet Place 1 tablet (0.4 mg total) under the tongue every 5 (five) minutes as needed for chest pain (Chest pain). 25 tablet 6  . pantoprazole (PROTONIX) 40 MG tablet Take 40 mg by mouth daily.    . polyethylene glycol (MIRALAX / GLYCOLAX) packet Take 17 g by mouth every other day.      No current facility-administered medications for this visit.    Allergies  Allergen Reactions  . Doxycycline     arthralgias  . Atorvastatin Rash  . Sulfa Drugs Cross Reactors Rash     History   Social History  . Marital Status: Widowed    Spouse Name: N/A    Number of Children: 2  . Years of Education: 12th   Occupational History  . Retired     Nutritional therapist   Social History Main Topics  . Smoking status: Current Every Day Smoker -- 1.00 packs/day for 65 years    Types: Cigarettes  . Smokeless tobacco: Never Used  . Alcohol Use: Yes     Comment: quit 40+ years ago  . Drug Use: No  . Sexual Activity: No   Other Topics Concern  . Not on file   Social History Narrative   Patient lives at home alone.   Caffeine Use: several cups of coffee daily     Review of Systems: General: negative for chills, fever, night sweats or weight changes.  Cardiovascular: negative for chest pain, dyspnea on exertion, edema, orthopnea, palpitations, paroxysmal nocturnal dyspnea or shortness of breath Dermatological: negative for rash Respiratory: negative for cough or wheezing Urologic: negative for hematuria Abdominal: negative for nausea, vomiting, diarrhea, bright red blood per rectum, melena, or hematemesis Neurologic: negative for visual changes, syncope, or dizziness All other systems reviewed and are otherwise negative except as noted above.    Blood pressure 148/62, pulse 68, height 6\' 3"  (1.905 m), weight 193 lb (87.544 kg).  General appearance: alert and no distress Neck: no adenopathy, no carotid bruit, no JVD, supple, symmetrical, trachea midline and thyroid not enlarged, symmetric, no tenderness/mass/nodules Lungs: clear to auscultation bilaterally Heart: regular rate and rhythm, S1, S2 normal, no murmur, click, rub or gallop Extremities: 2+ femorals bilaterally with bilateral femoral bruits right lateral left. He has 2+ left lower extremity edema since changes consistent with cellulitis on the dorsal surface of his left foot.  EKG not performed today  ASSESSMENT AND PLAN:   PAD-Lt SFA PTA 01/04/14 History of peripheral arterial disease status post left  superficial femoral artery diamond back orbital rotational atherectomy, PTA and stenting using Viabahn covered stents by myself 01/04/14. He did have 2 vessel runoff with a dominant vessel being his anterior tibial and a diffusely diseased peroneal artery. His lower extremity artery Doppler study today revealed a left ABI of 1 with a widely patent stent. He was seen back in the ER the day after discharge with abdominal pain and CT angiogram of his abdomen and lower extremities was unremarkable. Postoperative swelling and some redness in his left lower extremity as well as some hip pain. On exam today he does have a right femoral bruit and palpable left pedal pulse with 1+ pitting edema and some cellulitis on the dorsal surface of his left foot. I'm going to get a duplex of his right groin to rule out pseudoaneurysm. I'm going to increase his Lasix from 20-40 mg a day to get rid of some of his edema for 2 weeks and we'll check a basic metabolic  panel in 10 days. After which, we will decrease his Lasix to 20 mg a day. I'm also going to put him on some Augmentin twice a day for 10 days as well as recommend 20 mm knee-high compression stocking.      Lorretta Harp MD FACP,FACC,FAHA, [facility] 01/12/2014 2:51 PM

## 2014-01-15 ENCOUNTER — Other Ambulatory Visit: Payer: Medicare Other

## 2014-01-15 ENCOUNTER — Telehealth: Payer: Self-pay | Admitting: *Deleted

## 2014-01-15 ENCOUNTER — Ambulatory Visit (HOSPITAL_COMMUNITY)
Admission: RE | Admit: 2014-01-15 | Discharge: 2014-01-15 | Disposition: A | Discharge status: Home / Self Care | Payer: Medicare Other | Source: Ambulatory Visit | Attending: Internal Medicine | Admitting: Internal Medicine

## 2014-01-15 DIAGNOSIS — J432 Centrilobular emphysema: Secondary | ICD-10-CM | POA: Insufficient documentation

## 2014-01-15 DIAGNOSIS — I700 Atherosclerosis of aorta: Secondary | ICD-10-CM | POA: Diagnosis not present

## 2014-01-15 DIAGNOSIS — Z902 Acquired absence of lung [part of]: Secondary | ICD-10-CM | POA: Insufficient documentation

## 2014-01-15 DIAGNOSIS — I313 Pericardial effusion (noninflammatory): Secondary | ICD-10-CM | POA: Diagnosis not present

## 2014-01-15 DIAGNOSIS — Z923 Personal history of irradiation: Secondary | ICD-10-CM | POA: Insufficient documentation

## 2014-01-15 DIAGNOSIS — C3490 Malignant neoplasm of unspecified part of unspecified bronchus or lung: Secondary | ICD-10-CM

## 2014-01-15 DIAGNOSIS — I2510 Atherosclerotic heart disease of native coronary artery without angina pectoris: Secondary | ICD-10-CM | POA: Insufficient documentation

## 2014-01-15 DIAGNOSIS — J90 Pleural effusion, not elsewhere classified: Secondary | ICD-10-CM | POA: Insufficient documentation

## 2014-01-15 DIAGNOSIS — Z9221 Personal history of antineoplastic chemotherapy: Secondary | ICD-10-CM | POA: Insufficient documentation

## 2014-01-15 NOTE — Telephone Encounter (Signed)
I called daughter to see how Mr Audree Camel was feeling.  She reported no improvement in how he feels or in his leg.  She denies fevers.  She commented that she cannot

## 2014-01-16 ENCOUNTER — Ambulatory Visit (HOSPITAL_BASED_OUTPATIENT_CLINIC_OR_DEPARTMENT_OTHER): Payer: Medicare Other | Admitting: Internal Medicine

## 2014-01-16 ENCOUNTER — Encounter: Payer: Self-pay | Admitting: Internal Medicine

## 2014-01-16 VITALS — BP 124/57 | HR 62 | Temp 97.7°F | Resp 18 | Ht 75.0 in | Wt 190.2 lb

## 2014-01-16 DIAGNOSIS — Z85118 Personal history of other malignant neoplasm of bronchus and lung: Secondary | ICD-10-CM

## 2014-01-16 DIAGNOSIS — C3412 Malignant neoplasm of upper lobe, left bronchus or lung: Secondary | ICD-10-CM

## 2014-01-16 NOTE — Progress Notes (Signed)
[place] [phone]   [phone]  OFFICE PROGRESS NOTE  Curly Rim, MD [phone] [place] [address]  DIAGNOSIS: Stage IIB (T3, N0, MX) non-small cell lung cancer diagnosed in August of 2007.  PRIOR THERAPY:  1) Status post left upper lobectomy with lymph node dissection under the care of Dr. Arlyce Dice and the final pathology revealed 9.0 CM tumor consistent with squamous cell carcinoma. 2) Status post adjuvant chemotherapy with carboplatin for AUC of 6 and paclitaxel 200 mg/M2 every 3 weeks for a total of 4 cycles.  CURRENT THERAPY: Observation.  INTERVAL HISTORY: [name] 80 y.o. male returns to the clinic today for annual followup visit accompanied by his son. The patient is feeling fine today with no specific complaints except for mild fatigue and aching in his lower extremities. He recently underwent restenting of ISR within the previously placed right common iliac artery stent under the care of Dr. [name]. He denied having any significant chest pain, shortness of breath, cough or hemoptysis. His wife died in 09/01/13 from lung cancer. The patient denied having any fever or chills. He had repeat CT scan of the chest performed recently and he is here for evaluation and discussion of his scan results.  MEDICAL HISTORY: Past Medical History  Diagnosis Date  . CAD (coronary artery disease)     a. s/p DES-LCx 2006 b. repeat cath 2011- 90% distal LCx ISR, 100% mid RCA CTO, diffuse nonobstructive dz, medical management c. 2012-09-01 cath- 100% distal LCx ISR, 100% mid RCA CTO, nonobstructive dz; EF 40-45%  . PVC (premature ventricular contraction)   . HTN (hypertension)   . Paroxysmal a-fib   . PAD (peripheral artery disease)     history of stenting in the past  . COPD (chronic obstructive pulmonary disease)   . LV dysfunction   . Anemia, iron deficiency   . Family history of anesthesia complication     "daughter  gets PONV" (11/20/2013)  . Anginal pain   . Pneumonia 1-2 X's  . History of blood transfusion     "he's had several; probably related to his cancer" (11/20/2013)  . GERD (gastroesophageal reflux disease)   . H/O hiatal hernia   . History of gastric ulcer   . Arthritis     "generalized"  . Chronic lower back pain   . Anxiety   . Myocardial infarction 09/2009; ~ 2014    [name] 10/24/2009;   . Lung cancer dx'd 11/2005    non small cell    ALLERGIES:  is allergic to doxycycline; atorvastatin; and sulfa drugs cross reactors.  MEDICATIONS:  Current Outpatient Prescriptions  Medication Sig Dispense Refill  . acetaminophen (TYLENOL) 325 MG tablet Take 2 tablets (650 mg total) by mouth every 4 (four) hours as needed for headache or mild pain.    Marland Kitchen albuterol (PROVENTIL HFA;VENTOLIN HFA) 108 (90 BASE) MCG/ACT inhaler Inhale 2 puffs into the lungs every 4 (four) hours as needed for wheezing or shortness of breath.    Marland Kitchen amiodarone (PACERONE) 200 MG tablet Take 0.5 tablets (100 mg total) by mouth daily.    Marland Kitchen amLODipine (NORVASC) 10 MG tablet Take 0.5 tablets (5 mg total) by mouth daily. 90 tablet 6  . amoxicillin-clavulanate (AUGMENTIN) 875-125 MG per tablet Take 1 tablet by mouth 2 (two) times daily. 20 tablet 0  . aspirin 81 MG chewable tablet Chew 1 tablet (81 mg total) by mouth daily.    Marland Kitchen  clopidogrel (PLAVIX) 75 MG tablet Take 1 tablet (75 mg total) by mouth daily. 30 tablet 11  . diazepam (VALIUM) 2 MG tablet     . ferrous sulfate 325 (65 FE) MG tablet Take 325 mg by mouth 3 (three) times daily with meals.    . fexofenadine (ALLEGRA) 180 MG tablet Take 180 mg by mouth daily as needed (allergies).     . furosemide (LASIX) 20 MG tablet Take 1 tablet (20 mg total) by mouth daily as needed. Daily as needed for swelling. Take two tablets daily for 14 days then return to one tablet daily 45 tablet 3  . isosorbide mononitrate (IMDUR) 60 MG 24 hr tablet Take 60 mg by mouth at bedtime.    Marland Kitchen losartan  (COZAAR) 100 MG tablet Take 100 mg by mouth daily.    . nitroGLYCERIN (NITROSTAT) 0.4 MG SL tablet Place 1 tablet (0.4 mg total) under the tongue every 5 (five) minutes as needed for chest pain (Chest pain). 25 tablet 6  . pantoprazole (PROTONIX) 40 MG tablet Take 40 mg by mouth daily.    . polyethylene glycol (MIRALAX / GLYCOLAX) packet Take 17 g by mouth every other day.      No current facility-administered medications for this visit.    SURGICAL HISTORY:  Past Surgical History  Procedure Laterality Date  . Lobectomy Left     left upper  . Appendectomy    . Anterior cervical decomp/discectomy fusion    . Twenty four hour intraesophageal ph profile.  05/02/2001  . Aortogram with bilateral lower extremity runoff.  07/11/2003  . Fiberoptic bronchoscopy, mediastinum.  10/20/2005  . Video assisted thoracoscopy Left 12/01/2005  . Esophagogastroduodenoscopy with biopsy.  04/14/2010  . Iliac artery stent Right 11/20/2013  . Tonsillectomy  1972  . Inguinal hernia repair Bilateral     "I've had several"  . Back surgery      "I've had several back surgeries; got spacers down in lower back" (11/20/2013)  . Cataract extraction w/ intraocular lens  implant, bilateral Bilateral   . Coronary angioplasty with stent placement  2006    DES to LCX/notes 01/04/2014  . Cardiac catheterization  2011; 07/2012    [name] 01/04/2014  . Femoral artery stent Left 01/04/2014    REVIEW OF SYSTEMS:  A comprehensive review of systems was negative except for: Constitutional: positive for fatigue   PHYSICAL EXAMINATION: General appearance: alert, cooperative and no distress Head: Normocephalic, without obvious abnormality, atraumatic Neck: no adenopathy, no JVD, supple, symmetrical, trachea midline and thyroid not enlarged, symmetric, no tenderness/mass/nodules Lymph nodes: Cervical, supraclavicular, and axillary nodes normal. Resp: clear to auscultation bilaterally Back: symmetric, no curvature. ROM normal.  No CVA tenderness. Cardio: regular rate and rhythm, S1, S2 normal, no murmur, click, rub or gallop GI: soft, non-tender; bowel sounds normal; no masses,  no organomegaly Extremities: extremities normal, atraumatic, no cyanosis or edema  ECOG PERFORMANCE STATUS: 1 - Symptomatic but completely ambulatory  Blood pressure 124/57, pulse 62, temperature 97.7 F (36.5 C), temperature source Oral, resp. rate 18, height 6\' 3"  (1.905 m), weight 190 lb 3.2 oz (86.274 kg), SpO2 99 %.  LABORATORY DATA: Lab Results  Component Value Date   WBC 11.4* 01/05/2014   HGB 11.3* 01/05/2014   HCT 35.3* 01/05/2014   MCV 91.2 01/05/2014   PLT 239 01/05/2014      Chemistry      Component Value Date/Time   NA 133* 01/05/2014 2321   NA 136 12/20/2012 1520   K  4.5 01/05/2014 2321   K 4.4 12/20/2012 1520   CL 93* 01/05/2014 2321   CO2 27 01/05/2014 2321   CO2 26 12/20/2012 1520   BUN 9 01/05/2014 2321   BUN 10.1 12/20/2012 1520   CREATININE 0.72 01/05/2014 2321   CREATININE 0.75 12/28/2013 1152   CREATININE 1.1 12/20/2012 1520      Component Value Date/Time   CALCIUM 10.4 01/05/2014 2321   CALCIUM 9.9 12/20/2012 1520   ALKPHOS 69 01/05/2014 2321   ALKPHOS 67 12/20/2012 1520   AST 20 01/05/2014 2321   AST 13 12/20/2012 1520   ALT 13 01/05/2014 2321   ALT 10 12/20/2012 1520   BILITOT 0.3 01/05/2014 2321   BILITOT 0.38 12/20/2012 1520       RADIOGRAPHIC STUDIES: Ct Chest Wo Contrast  01/15/2014   CLINICAL DATA:  Lung cancer diagnosed 2007, status post partial left lobectomy, chemotherapy and XRT complete  EXAM: CT CHEST WITHOUT CONTRAST  TECHNIQUE: Multidetector CT imaging of the chest was performed following the standard protocol without IV contrast.  COMPARISON:  12/27/2012  FINDINGS: Status post left upper lobectomy with associated postsurgical changes in the left hemithorax. Small chronic pleural effusion at the left lung base (series 2/image 57). Subpleural reticulation/ scarring at the  left lung apex.  No suspicious pulmonary nodules. Underlying moderate centrilobular emphysematous changes. No pneumothorax.  Visualized thyroid is mildly heterogeneous/nodular.  The heart is normal in size with mild leftward cardiomediastinal shift. Trace pericardial effusion. Coronary atherosclerosis. Atherosclerotic calcifications of the aortic arch.  Small mediastinal lymph nodes which do not meet pathologic CT size criteria. No suspicious axillary lymphadenopathy.  Visualized upper abdomen is notable for vascular calcifications. Low-density thickening of the bilateral adrenal glands, chronic.  Degenerative changes of the visualized thoracolumbar spine.  IMPRESSION: Status post left upper lobectomy with associated postsurgical changes in the left hemithorax.  No evidence of recurrent or metastatic disease in the chest.   Electronically Signed   By: Julian Hy M.D.   On: 01/15/2014 15:39   Ct Angio Ao+bifem W/cm &/or Wo/cm  01/06/2014   ADDENDUM REPORT: 01/06/2014 07:23  ADDENDUM: Chronic left pleural effusion is also noted.   Electronically Signed   By: Inez Catalina M.D.   On: 01/06/2014 07:23   01/06/2014   CLINICAL DATA:  Left leg pain following stent placement with difficulty ambulating, initial encounter  EXAM: CT ANGIOGRAPHY OF ABDOMINAL AORTA WITH ILIOFEMORAL RUNOFF  TECHNIQUE: Multidetector CT imaging of the abdomen, pelvis and lower extremities was performed using the standard protocol during bolus administration of intravenous contrast. Multiplanar CT image reconstructions and MIPs were obtained to evaluate the vascular anatomy.  CONTRAST:  163mL OMNIPAQUE IOHEXOL 350 MG/ML SOLN  COMPARISON:  06/13/2013  FINDINGS: Aorta: The aorta shows evidence of calcified and noncalcified atherosclerotic plaque. No aneurysmal dilatation is noted. The origins of the mesenteric vessels are patent. Single renal arteries are identified bilaterally with mild atherosclerotic change.  Right Lower Extremity: The  right common iliac artery stent is noted and appears to be patent although the caliber of the stent is small and difficult to evaluate even on the coronal reconstructions. The degree of opacification of external iliac artery on the right suggests at the stent is patent. Heavy calcific plaque is noted within the external iliac artery without focal stenosis. Approximately 50% stenosis of the common femoral artery on the right is noted related to posterior calcified plaque. High-grade stenosis of the superficial femoral artery is noted at its origin on the right. Multifocal  areas of calcific plaque are noted throughout the superficial femoral artery on the right and the artery appears occluded throughout the majority of its course. There is reconstitution of the right popliteal artery identified via collaterals just above the knee joint. Heavily calcified plaque is noted. The popliteal trifurcation shows the PET tibioperoneal trunk to be patent although occlusion of the posterior tibial artery is noted at its origin. The peroneal artery is visualized to the level of the right ankle. The anterior tibial artery is identified to the level of the right ankle although more distal visualization is not appreciated on this exam.  Left Lower Extremity: Calcific changes are noted in the common and external iliac artery on the left without focal hemodynamically significant stenosis. Left superficial femoral artery stenting is noted. Caliber of the stent is small limiting internal evaluation somewhat. The stent does appear to be patent with runoff into the popliteal artery. Popliteal artery demonstrates calcific plaque but is otherwise patent. The popliteal trifurcation is patent although the posterior tibial artery is heavily calcified and likely occluded proximally. The anterior tibial artery and peroneal artery are patent but demonstrates some calcific changes. There visualized to the level of the ankle. More distal  visualization is not appreciated on this exam.  Review of the MIP images confirms the above findings.  Nonvascular: The liver, gallbladder, spleen, adrenal glands and pancreas are within normal limits. The kidneys are well visualized bilaterally and demonstrate vascular calcifications. Bilateral renal cysts are noted. The appendix is not visualized consistent prior surgical history. The bladder is well distended. The prostate is mildly prominent. No significant lymphadenopathy is seen. The osseous structures show postoperative changes in the lumbar spine. Multilevel degenerative changes are seen.  IMPRESSION: Patent left superficial femoral artery stent although evaluation of the stent is limited due to the small internal lumen size. Correlation with the popliteal pulse and distal pulses is recommended. Two vessel runoff to the left ankle is seen.  Changes consistent with occlusion of the right superficial femoral artery with reconstitution at the level of the mid popliteal artery on the right. Two-vessel runoff is noted to the right ankle. Proximal right superficial femoral artery stenosis is noted.  Patent right common iliac artery stent.  No acute nonvascular abnormality is noted.  Electronically Signed: By: Inez Catalina M.D. On: 01/06/2014 07:20   ASSESSMENT AND PLAN: This is a very pleasant 80 years old white male with history of stage IIB non-small cell lung cancer, squamous cell carcinoma status post left upper lobectomy with lymph node dissection followed by 4 cycles of adjuvant chemotherapy. The patient has been observation for the last 8 years. The recent CT scan of the chest showed no evidence for disease recurrence. I discussed the scan results with the patient and his son. I recommended for him to continue on observation with repeat CT scan of the chest in one year. He was advised to call immediately if he has any concerning symptoms in the interval. The patient voices understanding of current  disease status and treatment options and is in agreement with the current care plan.  All questions were answered. The patient knows to call the clinic with any problems, questions or concerns. We can certainly see the patient much sooner if necessary.  Disclaimer: This note was dictated with voice recognition software. Similar sounding words can inadvertently be transcribed and may be missed upon review.

## 2014-01-16 NOTE — Patient Instructions (Signed)
Smoking Cessation, Tips for Success  If you are ready to quit smoking, congratulations! You have chosen to help yourself be healthier. Cigarettes bring nicotine, tar, carbon monoxide, and other irritants into your body. Your lungs, heart, and blood vessels will be able to work better without these poisons. There are many different ways to quit smoking. Nicotine gum, nicotine patches, a nicotine inhaler, or nicotine nasal spray can help with physical craving. Hypnosis, support groups, and medicines help break the habit of smoking.  WHAT THINGS CAN I DO TO MAKE QUITTING EASIER?   Here are some tips to help you quit for good:  · Pick a date when you will quit smoking completely. Tell all of your friends and family about your plan to quit on that date.  · Do not try to slowly cut down on the number of cigarettes you are smoking. Pick a quit date and quit smoking completely starting on that day.  · Throw away all cigarettes.    · Clean and remove all ashtrays from your home, work, and car.  · On a card, write down your reasons for quitting. Carry the card with you and read it when you get the urge to smoke.  · Cleanse your body of nicotine. Drink enough water and fluids to keep your urine clear or pale yellow. Do this after quitting to flush the nicotine from your body.  · Learn to predict your moods. Do not let a bad situation be your excuse to have a cigarette. Some situations in your life might tempt you into wanting a cigarette.  · Never have "just one" cigarette. It leads to wanting another and another. Remind yourself of your decision to quit.  · Change habits associated with smoking. If you smoked while driving or when feeling stressed, try other activities to replace smoking. Stand up when drinking your coffee. Brush your teeth after eating. Sit in a different chair when you read the paper. Avoid alcohol while trying to quit, and try to drink fewer caffeinated beverages. Alcohol and caffeine may urge you to  smoke.  · Avoid foods and drinks that can trigger a desire to smoke, such as sugary or spicy foods and alcohol.  · Ask people who smoke not to smoke around you.  · Have something planned to do right after eating or having a cup of coffee. For example, plan to take a walk or exercise.  · Try a relaxation exercise to calm you down and decrease your stress. Remember, you may be tense and nervous for the first 2 weeks after you quit, but this will pass.  · Find new activities to keep your hands busy. Play with a pen, coin, or rubber band. Doodle or draw things on paper.  · Brush your teeth right after eating. This will help cut down on the craving for the taste of tobacco after meals. You can also try mouthwash.    · Use oral substitutes in place of cigarettes. Try using lemon drops, carrots, cinnamon sticks, or chewing gum. Keep them handy so they are available when you have the urge to smoke.  · When you have the urge to smoke, try deep breathing.  · Designate your home as a nonsmoking area.  · If you are a heavy smoker, ask your health care provider about a prescription for nicotine chewing gum. It can ease your withdrawal from nicotine.  · Reward yourself. Set aside the cigarette money you save and buy yourself something nice.  · Look for   support from others. Join a support group or smoking cessation program. Ask someone at home or at work to help you with your plan to quit smoking.  · Always ask yourself, "Do I need this cigarette or is this just a reflex?" Tell yourself, "Today, I choose not to smoke," or "I do not want to smoke." You are reminding yourself of your decision to quit.  · Do not replace cigarette smoking with electronic cigarettes (commonly called e-cigarettes). The safety of e-cigarettes is unknown, and some may contain harmful chemicals.  · If you relapse, do not give up! Plan ahead and think about what you will do the next time you get the urge to smoke.  HOW WILL I FEEL WHEN I QUIT SMOKING?  You  may have symptoms of withdrawal because your body is used to nicotine (the addictive substance in cigarettes). You may crave cigarettes, be irritable, feel very hungry, cough often, get headaches, or have difficulty concentrating. The withdrawal symptoms are only temporary. They are strongest when you first quit but will go away within 10-14 days. When withdrawal symptoms occur, stay in control. Think about your reasons for quitting. Remind yourself that these are signs that your body is healing and getting used to being without cigarettes. Remember that withdrawal symptoms are easier to treat than the major diseases that smoking can cause.   Even after the withdrawal is over, expect periodic urges to smoke. However, these cravings are generally short lived and will go away whether you smoke or not. Do not smoke!  WHAT RESOURCES ARE AVAILABLE TO HELP ME QUIT SMOKING?  Your health care provider can direct you to community resources or hospitals for support, which may include:  · Group support.  · Education.  · Hypnosis.  · Therapy.  Document Released: 11/08/2003 Document Revised: 06/26/2013 Document Reviewed: 07/28/2012  Exit[employer]® Patient Information ©2015 [employer]. This information is not intended to replace advice given to you by your health care provider. Make sure you discuss any questions you have with your health care provider.

## 2014-01-17 ENCOUNTER — Telehealth: Payer: Self-pay | Admitting: Internal Medicine

## 2014-01-17 NOTE — Telephone Encounter (Signed)
lvm for pt regarding to NOV 2016 appt....mailed pt appt sched/avs and letter

## 2014-01-17 NOTE — Telephone Encounter (Signed)
Finishing the sentence from previous documentation.  She commented that she cannot apply the compression stockings.  She cannot get pass his toes.  I advised to try ACE bandages that are wrapped from toes to below the knee.  She will call be back with any problems or concerns.

## 2014-01-24 ENCOUNTER — Ambulatory Visit (INDEPENDENT_AMBULATORY_CARE_PROVIDER_SITE_OTHER): Payer: Medicare Other | Admitting: Diagnostic Neuroimaging

## 2014-01-24 ENCOUNTER — Encounter: Payer: Self-pay | Admitting: Diagnostic Neuroimaging

## 2014-01-24 VITALS — BP 113/52 | HR 56 | Temp 98.0°F | Ht 75.0 in | Wt 191.6 lb

## 2014-01-24 DIAGNOSIS — R269 Unspecified abnormalities of gait and mobility: Secondary | ICD-10-CM

## 2014-01-24 DIAGNOSIS — R42 Dizziness and giddiness: Secondary | ICD-10-CM

## 2014-01-24 DIAGNOSIS — M21371 Foot drop, right foot: Secondary | ICD-10-CM

## 2014-01-24 DIAGNOSIS — M21372 Foot drop, left foot: Secondary | ICD-10-CM

## 2014-01-24 DIAGNOSIS — G20 Parkinson's disease: Secondary | ICD-10-CM

## 2014-01-24 DIAGNOSIS — I739 Peripheral vascular disease, unspecified: Secondary | ICD-10-CM

## 2014-01-24 MED ORDER — CARBIDOPA-LEVODOPA 25-100 MG PO TABS: 0.5000 | ORAL_TABLET | Freq: Three times a day (TID) | ORAL | Status: DC

## 2014-01-24 NOTE — Patient Instructions (Signed)
Try carbidopa/levodopa. Half tab 3 times per day for 2 weeks, then 1 tab three times per day.  Use walker.

## 2014-01-24 NOTE — Progress Notes (Signed)
[facility]  PATIENT: [name] DOB: Feb 05, 1934  REFERRING CLINICIAN: Hongalgi HISTORY FROM: patient  REASON FOR VISIT: new consult    HISTORICAL  CHIEF COMPLAINT:  Chief Complaint  Patient presents with  . Follow-up    gait difficulty    HISTORY OF PRESENT ILLNESS:   UPDATE 01/24/14: Since last visit, still has "dizziness": swimmy headed, lightheaded, balance issues, staggering. Had stent to left leg. Planning to have stent to right leg. No very active. Stay at home most of the time. Tremor with handwriting and drinking very difficult at times.  PRIOR HPI (11/14/13): 80 year old right handed male with hypertension, heart disease, lung cancer (s/p paclitaxel and carboplatin 2007-8), COPD, emphysema, here for evaluation of gait difficulty and tremors. 3 years ago patient had onset of "staggering" and unsteady gait. This has been gradual and progressive. In addition he has had intermittent episodes of spinning sensation associated with nausea and vomiting, lasting 1-2 minutes at a time. These are typically triggered by changing head position, moving from sitting to standing or standing to lying down. Patient also has had progressive hearing loss over the past 3 years. Patient has developed tremors in his hands, especially with handwriting, using utensils, drinking from a cup. No resting tremor. Patient also has cold sensitivity in his fingers, with color change to purple and then white if he has prolonged exposure.   REVIEW OF SYSTEMS: Full 14 system review of systems performed and notable only for fatigue leg swelling easy bruising aching muscles.   ALLERGIES: Allergies  Allergen Reactions  . Doxycycline     arthralgias  . Atorvastatin Rash  . Sulfa Drugs Cross Reactors Rash    HOME MEDICATIONS: Outpatient Prescriptions Prior to Visit  Medication Sig Dispense Refill  . acetaminophen (TYLENOL) 325 MG tablet Take 2 tablets (650 mg total) by mouth every 4  (four) hours as needed for headache or mild pain.    Marland Kitchen albuterol (PROVENTIL HFA;VENTOLIN HFA) 108 (90 BASE) MCG/ACT inhaler Inhale 2 puffs into the lungs every 4 (four) hours as needed for wheezing or shortness of breath.    Marland Kitchen amiodarone (PACERONE) 200 MG tablet Take 0.5 tablets (100 mg total) by mouth daily.    Marland Kitchen amLODipine (NORVASC) 10 MG tablet Take 0.5 tablets (5 mg total) by mouth daily. 90 tablet 6  . amoxicillin-clavulanate (AUGMENTIN) 875-125 MG per tablet Take 1 tablet by mouth 2 (two) times daily. 20 tablet 0  . aspirin 81 MG chewable tablet Chew 1 tablet (81 mg total) by mouth daily.    . clopidogrel (PLAVIX) 75 MG tablet Take 1 tablet (75 mg total) by mouth daily. 30 tablet 11  . diazepam (VALIUM) 2 MG tablet     . ferrous sulfate 325 (65 FE) MG tablet Take 325 mg by mouth 3 (three) times daily with meals.    . fexofenadine (ALLEGRA) 180 MG tablet Take 180 mg by mouth daily as needed (allergies).     . furosemide (LASIX) 20 MG tablet Take 1 tablet (20 mg total) by mouth daily as needed. Daily as needed for swelling. Take two tablets daily for 14 days then return to one tablet daily 45 tablet 3  . isosorbide mononitrate (IMDUR) 60 MG 24 hr tablet Take 60 mg by mouth at bedtime.    Marland Kitchen losartan (COZAAR) 100 MG tablet Take 100 mg by mouth daily.    . nitroGLYCERIN (NITROSTAT) 0.4 MG SL tablet Place 1 tablet (0.4 mg total) under the tongue every 5 (five) minutes  as needed for chest pain (Chest pain). 25 tablet 6  . pantoprazole (PROTONIX) 40 MG tablet Take 40 mg by mouth daily.    . polyethylene glycol (MIRALAX / GLYCOLAX) packet Take 17 g by mouth every other day.      No facility-administered medications prior to visit.    PAST MEDICAL HISTORY: Past Medical History  Diagnosis Date  . CAD (coronary artery disease)     a. s/p DES-LCx 2006 b. repeat cath 2011- 90% distal LCx ISR, 100% mid RCA CTO, diffuse nonobstructive dz, medical management c. 07/2012 cath- 100% distal LCx ISR, 100% mid  RCA CTO, nonobstructive dz; EF 40-45%  . PVC (premature ventricular contraction)   . HTN (hypertension)   . Paroxysmal a-fib   . PAD (peripheral artery disease)     history of stenting in the past  . COPD (chronic obstructive pulmonary disease)   . LV dysfunction   . Anemia, iron deficiency   . Family history of anesthesia complication     "daughter gets PONV" (11/20/2013)  . Anginal pain   . Pneumonia 1-2 X's  . History of blood transfusion     "he's had several; probably related to his cancer" (11/20/2013)  . GERD (gastroesophageal reflux disease)   . H/O hiatal hernia   . History of gastric ulcer   . Arthritis     "generalized"  . Chronic lower back pain   . Anxiety   . Myocardial infarction 09/2009; ~ 2014    [name] 10/24/2009;   . Lung cancer dx'd 11/2005    non small cell    PAST SURGICAL HISTORY: Past Surgical History  Procedure Laterality Date  . Lobectomy Left     left upper  . Appendectomy    . Anterior cervical decomp/discectomy fusion    . Twenty four hour intraesophageal ph profile.  05/02/2001  . Aortogram with bilateral lower extremity runoff.  07/11/2003  . Fiberoptic bronchoscopy, mediastinum.  10/20/2005  . Video assisted thoracoscopy Left 12/01/2005  . Esophagogastroduodenoscopy with biopsy.  04/14/2010  . Iliac artery stent Right 11/20/2013  . Tonsillectomy  1972  . Inguinal hernia repair Bilateral     "I've had several"  . Back surgery      "I've had several back surgeries; got spacers down in lower back" (11/20/2013)  . Cataract extraction w/ intraocular lens  implant, bilateral Bilateral   . Coronary angioplasty with stent placement  2006    DES to LCX/notes 01/04/2014  . Cardiac catheterization  2011; 07/2012    [name] 01/04/2014  . Femoral artery stent Left 01/04/2014    FAMILY HISTORY: Family History  Problem Relation Age of Onset  . Heart Problems Mother   . Other Father     Motorcycle Accident    SOCIAL HISTORY:  History   Social  History  . Marital Status: Widowed    Spouse Name: N/A    Number of Children: 2  . Years of Education: 12th   Occupational History  . Retired     Nutritional therapist   Social History Main Topics  . Smoking status: Current Every Day Smoker -- 1.00 packs/day for 65 years    Types: Cigarettes  . Smokeless tobacco: Never Used  . Alcohol Use: Yes     Comment: quit 40+ years ago  . Drug Use: No  . Sexual Activity: No   Other Topics Concern  . Not on file   Social History Narrative   Patient lives at home alone.   Caffeine Use: several  cups of coffee daily     PHYSICAL EXAM  Filed Vitals:   01/24/14 1002  BP: 113/52  Pulse: 56  Temp: 98 F (36.7 C)  TempSrc: Oral  Height: 6\' 3"  (1.905 m)  Weight: 191 lb 9.6 oz (86.909 kg)    Not recorded      Body mass index is 23.95 kg/(m^2).  GENERAL EXAM: Patient is in no distress; well developed, nourished and groomed; neck is supple  CARDIOVASCULAR: Regular rate and rhythm, no murmurs, no carotid bruits  NEUROLOGIC: MENTAL STATUS: awake, alert, oriented to person, place and time, recent and remote memory intact, normal attention and concentration, language fluent, comprehension intact, naming intact, fund of knowledge appropriate CRANIAL NERVE: no papilledema on fundoscopic exam, pupils equal and reactive to light, visual fields full to confrontation, extraocular muscles intact, no nystagmus, facial sensation and strength symmetric, hearing intact, palate elevates symmetrically, uvula midline, shoulder shrug symmetric, tongue midline. HOARSE VOICE; MILD VOICE TREMOR.  MOTOR: MILD POSTURAL TREMOR IN BUE; NO RESTING TREMOR; MILD COGWHEELING AND BRADYKINESIA IN BUE; normal bulk and tone, full strength in the BUE; BLE (HF 5, KE 5, KF 5, DF 3) SENSORY: ABSENT VIB AT ANKLES AND TOES COORDINATION: finger-nose-finger, fine finger movements normal REFLEXES: BUE 1, BLE 0, DOWN GOING TOES GAIT/STATION: WIDE, SLOW, CAUTIOUS, STEPPAGE GAIT,  UNSTEADY. NEEDS HELP TO STAND AND TURN.    DIAGNOSTIC DATA (LABS, IMAGING, TESTING) - I reviewed patient records, labs, notes, testing and imaging myself where available.  Lab Results  Component Value Date   WBC 11.4* 01/05/2014   HGB 11.3* 01/05/2014   HCT 35.3* 01/05/2014   MCV 91.2 01/05/2014   PLT 239 01/05/2014      Component Value Date/Time   NA 133* 01/05/2014 2321   NA 136 12/20/2012 1520   K 4.5 01/05/2014 2321   K 4.4 12/20/2012 1520   CL 93* 01/05/2014 2321   CO2 27 01/05/2014 2321   CO2 26 12/20/2012 1520   GLUCOSE 119* 01/05/2014 2321   GLUCOSE 98 12/20/2012 1520   BUN 9 01/05/2014 2321   BUN 10.1 12/20/2012 1520   CREATININE 0.72 01/05/2014 2321   CREATININE 0.75 12/28/2013 1152   CREATININE 1.1 12/20/2012 1520   CALCIUM 10.4 01/05/2014 2321   CALCIUM 9.9 12/20/2012 1520   PROT 7.3 01/05/2014 2321   PROT 7.0 12/20/2012 1520   ALBUMIN 3.6 01/05/2014 2321   ALBUMIN 3.3* 12/20/2012 1520   AST 20 01/05/2014 2321   AST 13 12/20/2012 1520   ALT 13 01/05/2014 2321   ALT 10 12/20/2012 1520   ALKPHOS 69 01/05/2014 2321   ALKPHOS 67 12/20/2012 1520   BILITOT 0.3 01/05/2014 2321   BILITOT 0.38 12/20/2012 1520   GFRNONAA 87* 01/05/2014 2321   GFRAA >90 01/05/2014 2321   Lab Results  Component Value Date   CHOL 166 06/16/2011   HDL 61.30 06/16/2011   LDLCALC 91 06/16/2011   TRIG 70.0 06/16/2011   CHOLHDL 3 06/16/2011   No results found for: HGBA1C Lab Results  Component Value Date   VITAMINB12 433 11/15/2013   Lab Results  Component Value Date   TSH 2.149 11/15/2013   TSH 1.879 11/15/2013    11/10/13 CT head  1. No acute intracranial pathology seen on CT.  2. Mild to moderate cortical volume loss. Significant cerebellar atrophy again noted. Scattered small vessel ischemic microangiopathy.  09/12/13 carotid u/s - mild fibrous plaque with no stenosis  09/12/13 BLE arterial u/s - right common iliac artery: 70-99% reduction -  left common iliac  artery: equal or greater than 50% reduction - bilateral superficial femoral artery: demonstrated occlusive disease with reconstitution within the distal segment - bilateral runoff: posterior tibial arteries occluded  12/06/13 MRI brain and IAC (with and without) demonstrating: 1. Mild periventricular and subcortical and pontine chronic small vessel ischemic disease.  2. Mega cisterna magna vs moderate cerebellar atrophy. 3. Thin cut views of the internal auditory canals, semicircular canals and brain stem structures are unremarkable. No compressive or abnormal enhancing lesions noted.  4. No change from MRI on 09/25/09.  12/06/13 MRI cervical spine (without) demonstrating: 1. At C4-5: left facet hypertrophy with severe left foraminal stenosis. 2. At C5-6: uncovertebral joint hypertrophy and facet hypertrophy with moderate biforaminal stenosis. 3. Anterior cervical fusion at C4-C6 with metal hardware.  12/06/13 MRI lumbar spine (without) demonstrating: 1. At L4-5, L5-S1: bilateral facetectomies with interbody cages, with moderate biforaminal stenosis. 2. At L3-4: disc bulging and facet hypertrophy with mild biforaminal stenosis. 3. Degenerative endplate disease with marrow edema at L3 inferiorly (with schmorl's node) and L4 antero-superiorly. 4. Anterior spondylosis and bone bridging from T12-L1 to L4-5.      ASSESSMENT AND PLAN  80 y.o. year old male here with progressive "dizziness", which consists of several symptoms: staggering gait, lightheaded feeling, decreased coordination, balance diff. Also with increased tone in upper ext, postural and action tremor in upper ext, cogwheel rigidity, bradykinesia, low back pain, bilateral foot drop, peripheral arterial disease. No clear unifying diagnosis. I suspect that several factors are contributing.   Ddx: parkinsonism (neurodegenerative, vascular), lumbar radiculopathies (L5) vs chemo-neuropathy, orthostatic hypotension  PLAN: - trial of  carb/levo for parkinsonism; may try primidone in future if carb/levo not helping - use rollator walker - monitor BP at home if he has more attacks of swimmy headed feeling - update patient and son today; also spoke with daughter via phone  Meds ordered this encounter  Medications  . carbidopa-levodopa (SINEMET IR) 25-100 MG per tablet    Sig: Take 0.5-1 tablets by mouth 3 (three) times daily.    Dispense:  90 tablet    Refill:  6   Return in about 4 months (around 05/26/2014).    Penni Bombard, MD 62/10/4763, 46:50 AM Certified in Neurology, Neurophysiology and Neuroimaging  [facility] [address] [place], [place] [address] [phone]

## 2014-01-29 ENCOUNTER — Telehealth: Payer: Self-pay | Admitting: Cardiovascular Disease

## 2014-01-29 NOTE — Telephone Encounter (Signed)
Please call question,Dr [name] wants pt to come in for lab work this week. She wants to know if he can have his lab work when he comes in to see Dr [name] next Tuesday? This way she will not have to take 2 days off from work.

## 2014-01-29 NOTE — Telephone Encounter (Signed)
Patient was due for a BMET on 11/30 (10 days after OV on 11/20 when lasix was increased). BMET has not been done. Called daughter - patient is not even taking lasix as prescribed, he is taking as he feels like it. Spoke with Curt Bears, RN - OK to wait until OV next Tuesday with Dr. [name] to have labs done same day - patient lives in [place] & daughter works in [employer]. Daughter voiced understanding of plan.

## 2014-01-30 NOTE — Progress Notes (Signed)
   11/11/13 1418  PT G-Codes **NOT FOR INPATIENT CLASS**  Functional Assessment Tool Used clinical Judgement  Functional Limitation Mobility: Walking and moving around  Mobility: Walking and Moving Around Current Status [phone]) CI  Mobility: Walking and Moving Around Goal Status [phone]) [facility]

## 2014-02-01 ENCOUNTER — Encounter (HOSPITAL_COMMUNITY): Payer: Self-pay | Admitting: Cardiovascular Disease

## 2014-02-06 ENCOUNTER — Ambulatory Visit (INDEPENDENT_AMBULATORY_CARE_PROVIDER_SITE_OTHER): Payer: Medicare Other | Admitting: Cardiovascular Disease

## 2014-02-06 ENCOUNTER — Encounter: Payer: Self-pay | Admitting: Cardiovascular Disease

## 2014-02-06 VITALS — BP 130/70 | HR 64 | Ht 75.0 in | Wt 193.0 lb

## 2014-02-06 DIAGNOSIS — I739 Peripheral vascular disease, unspecified: Secondary | ICD-10-CM

## 2014-02-06 NOTE — Patient Instructions (Addendum)
Your physician wants you to follow-up in 3 months with Dr. [name]. You will receive a reminder letter in the mail 2 months in advance. If you do not receive a letter, please call our office to schedule the follow-up appointment.  Please obtain some compression stockings.  Ila Mcgill, MD - [facility] Address: [address] [place], [place] [address]  [phone]

## 2014-02-06 NOTE — Progress Notes (Signed)
02/06/2014 [name]   1933/04/13  [id number]  Primary Physician Curly Rim, MD Primary Cardiologist: Lorretta Harp MD [place], [place]   HPI:  [name] is a 80 y.o. male with History of CAD with prior DES to LCX in 2006, repeat cath in 2011 and in June of 2014. He has chronic occlusion of the mid RCA and of the distal circumflex - managed medically, ischemic CM with EF 30 to 35%, ongoing tobacco abuse, SVT - on amiodarone, COPD, history of lung cancer and HTN. Has had recent GI bleed with colonoscopy/endoscopy performed in September 2015.  He was seen by myself  for PV evaluation - underwent a PV angiogram on 11/20/13 and underwent successful restenting of ISR within the previously placed right common iliac artery stent. Discharged on DAPT. Also noted to have high grade, subtotal/total calcified SFAs bilaterally with 2 vessel runoff which Dr. [name] thought could be best treated with orbital rotational arthrectomy +- PTA/stenting. Followup arterial Dopplers revealed decreased callosities in the right common iliac artery from 469-319 cm/s. ABIs are unchanged as expected. Patient was recently seen by [name], nurse practitioner for lower extremity edema. She put him on when necessary Lasix 20 mg. He is also taking iron 3 times a day for anemia. Hemoglobin is stable at 9.7. He had percutaneous intervention of his right common iliac artery and at that time angiography documented occluded SFAs bilaterally. His right leg improved but he still has bilateral claudication left greater than right. On 01/04/14 he underwent diamondback orbital rotational atherectomy, PTA and Viabahn covered stenting of his left SFA chronic total occlusion with excellent intravascular result. He went back to the ER the following day with abdominal pain. A CT angiogram showed no abdominal pathology with a widely patent stent. Lower extremity arterial Doppler studies  today reveal his stent to be widely patent with a normal ABI on the left. She does have some swelling of his left leg as well as some cellulitis on the dorsal aspect of his left foot.I last saw him in the office 01/12/14. I suggested that he begin daily Lasix which she has been taking intermittently and compression stockings. His daughter was unable to find stockings to fit him. His claudication on the left side has markedly improved. I've emphasized the importance of smoking cessation, compliance with his diuretic regimen and obtaining compression stockings with leg elevation. We've also talked about staged right SFA intervention should he decide that he wants to pursue this.   Current Outpatient Prescriptions  Medication Sig Dispense Refill  . acetaminophen (TYLENOL) 325 MG tablet Take 2 tablets (650 mg total) by mouth every 4 (four) hours as needed for headache or mild pain.    Marland Kitchen albuterol (PROVENTIL HFA;VENTOLIN HFA) 108 (90 BASE) MCG/ACT inhaler Inhale 2 puffs into the lungs every 4 (four) hours as needed for wheezing or shortness of breath.    Marland Kitchen amiodarone (PACERONE) 200 MG tablet Take 0.5 tablets (100 mg total) by mouth daily.    Marland Kitchen amLODipine (NORVASC) 10 MG tablet Take 0.5 tablets (5 mg total) by mouth daily. 90 tablet 6  . amoxicillin-clavulanate (AUGMENTIN) 875-125 MG per tablet Take 1 tablet by mouth 2 (two) times daily. 20 tablet 0  . aspirin 81 MG chewable tablet Chew 1 tablet (81 mg total) by mouth daily.    . carbidopa-levodopa (SINEMET IR) 25-100 MG per tablet Take 0.5-1 tablets by mouth 3 (three) times daily. 90 tablet 6  . clopidogrel (PLAVIX) 75 MG  tablet Take 1 tablet (75 mg total) by mouth daily. 30 tablet 11  . diazepam (VALIUM) 2 MG tablet     . ferrous sulfate 325 (65 FE) MG tablet Take 325 mg by mouth 3 (three) times daily with meals.    . fexofenadine (ALLEGRA) 180 MG tablet Take 180 mg by mouth daily as needed (allergies).     . furosemide (LASIX) 20 MG tablet Take 1 tablet  (20 mg total) by mouth daily as needed. Daily as needed for swelling. Take two tablets daily for 14 days then return to one tablet daily 45 tablet 3  . isosorbide mononitrate (IMDUR) 60 MG 24 hr tablet Take 60 mg by mouth at bedtime.    Marland Kitchen losartan (COZAAR) 100 MG tablet Take 100 mg by mouth daily.    . nitroGLYCERIN (NITROSTAT) 0.4 MG SL tablet Place 1 tablet (0.4 mg total) under the tongue every 5 (five) minutes as needed for chest pain (Chest pain). 25 tablet 6  . pantoprazole (PROTONIX) 40 MG tablet Take 40 mg by mouth daily.    . polyethylene glycol (MIRALAX / GLYCOLAX) packet Take 17 g by mouth every other day.      No current facility-administered medications for this visit.    Allergies  Allergen Reactions  . Doxycycline     arthralgias  . Atorvastatin Rash  . Sulfa Drugs Cross Reactors Rash    History   Social History  . Marital Status: Widowed    Spouse Name: N/A    Number of Children: 2  . Years of Education: 12th   Occupational History  . Retired     Nutritional therapist   Social History Main Topics  . Smoking status: Current Every Day Smoker -- 1.00 packs/day for 65 years    Types: Cigarettes  . Smokeless tobacco: Never Used  . Alcohol Use: Yes     Comment: quit 40+ years ago  . Drug Use: No  . Sexual Activity: No   Other Topics Concern  . Not on file   Social History Narrative   Patient lives at home alone.   Caffeine Use: several cups of coffee daily     Review of Systems: General: negative for chills, fever, night sweats or weight changes.  Cardiovascular: negative for chest pain, dyspnea on exertion, edema, orthopnea, palpitations, paroxysmal nocturnal dyspnea or shortness of breath Dermatological: negative for rash Respiratory: negative for cough or wheezing Urologic: negative for hematuria Abdominal: negative for nausea, vomiting, diarrhea, bright red blood per rectum, melena, or hematemesis Neurologic: negative for visual changes, syncope, or  dizziness All other systems reviewed and are otherwise negative except as noted above.    Blood pressure 130/70, pulse 64, height 6\' 3"  (1.905 m), weight 193 lb (87.544 kg).  General appearance: alert and no distress Neck: no adenopathy, no JVD, supple, symmetrical, trachea midline, thyroid not enlarged, symmetric, no tenderness/mass/nodules and soft bilateral carotid bruits Lungs: clear to auscultation bilaterally Heart: regular rate and rhythm, S1, S2 normal, no murmur, click, rub or gallop Extremities: 1-2+ pitting edema left lower extremity  EKG not performed today  ASSESSMENT AND PLAN:   PAD-Lt SFA PTA 01/04/14 History of peripheral arterial disease status post remote stenting of his right common iliac artery. I angiogram him on 11/20/13 revealing "75% in-stent restenosis" within his right common iliac artery stent which I restented using ICast  covered stent.he had calcified bilateral SFA occlusions. I brought him back on 01/04/14 and performed diamondback orbital rotational atherectomy, PTCA and stenting using overlapping  Viabahn. Stents.his Dopplers performed 01/12/14 revealed normalization of his left ABI to 0.97. His right ABI is 0.55. He does have some swelling in his left foot and pretibial tibial area probably related to the increased blood flow. I had prescribed Lasix which he has been taking on an as-needed basis. I'm going to fit him for 20-30 mm knee-high compression stockings. I've offered him the option of pursuing right SFA intervention. He is on triple anti-platelet therapy.      Lorretta Harp MD FACP,FACC,FAHA, [facility] 02/06/2014 12:34 PM

## 2014-02-06 NOTE — Assessment & Plan Note (Signed)
History of peripheral arterial disease status post remote stenting of his right common iliac artery. I angiogram him on 11/20/13 revealing "75% in-stent restenosis" within his right common iliac artery stent which I restented using ICast  covered stent.he had calcified bilateral SFA occlusions. I brought him back on 01/04/14 and performed diamondback orbital rotational atherectomy, PTCA and stenting using overlapping Viabahn. Stents.his Dopplers performed 01/12/14 revealed normalization of his left ABI to 0.97. His right ABI is 0.55. He does have some swelling in his left foot and pretibial tibial area probably related to the increased blood flow. I had prescribed Lasix which he has been taking on an as-needed basis. I'm going to fit him for 20-30 mm knee-high compression stockings. I've offered him the option of pursuing right SFA intervention. He is on triple anti-platelet therapy.

## 2014-02-08 ENCOUNTER — Other Ambulatory Visit: Payer: Self-pay | Admitting: Dermatology

## 2014-03-01 ENCOUNTER — Ambulatory Visit: Payer: Medicare Other | Admitting: Podiatry

## 2014-03-08 ENCOUNTER — Ambulatory Visit (INDEPENDENT_AMBULATORY_CARE_PROVIDER_SITE_OTHER): Payer: Medicare Other | Admitting: Podiatry

## 2014-03-08 ENCOUNTER — Encounter: Payer: Self-pay | Admitting: Podiatry

## 2014-03-08 VITALS — BP 136/68 | HR 52 | Resp 16

## 2014-03-08 DIAGNOSIS — B351 Tinea unguium: Secondary | ICD-10-CM

## 2014-03-08 DIAGNOSIS — M79673 Pain in unspecified foot: Secondary | ICD-10-CM

## 2014-03-08 NOTE — Progress Notes (Signed)
   Subjective:    Patient ID: [name], male    DOB: 1933-11-24, 81 y.o.   MRN: [id number]  HPI Comments: "I can't cut these things"  Patient states that he can't cut the toenails anymore. Very long and thick. Get sore sometimes.     Review of Systems  HENT: Positive for hearing loss.   Gastrointestinal: Positive for constipation.  Musculoskeletal: Positive for gait problem.  Neurological: Positive for dizziness, tremors and light-headedness.  All other systems reviewed and are negative.      Objective:   Physical Exam        Assessment & Plan:

## 2014-03-15 ENCOUNTER — Other Ambulatory Visit: Payer: Self-pay | Admitting: Cardiology

## 2014-03-15 NOTE — Telephone Encounter (Signed)
Rx has been sent to the pharmacy electronically. ° °

## 2014-04-14 ENCOUNTER — Other Ambulatory Visit: Payer: Self-pay | Admitting: Cardiology

## 2014-05-08 ENCOUNTER — Ambulatory Visit: Payer: Medicare Other | Admitting: Cardiovascular Disease

## 2014-05-24 ENCOUNTER — Other Ambulatory Visit: Payer: Self-pay | Admitting: Cardiology

## 2014-06-01 ENCOUNTER — Telehealth: Payer: Self-pay | Admitting: Internal Medicine

## 2014-06-01 NOTE — Telephone Encounter (Signed)
lvm for pt regarding to 11.30 appt moved to 11.29 due to MD on call....mailed pt appt sched and letter

## 2014-06-13 ENCOUNTER — Ambulatory Visit (INDEPENDENT_AMBULATORY_CARE_PROVIDER_SITE_OTHER): Payer: PRIVATE HEALTH INSURANCE | Admitting: Cardiovascular Disease

## 2014-06-13 ENCOUNTER — Encounter: Payer: Self-pay | Admitting: Cardiovascular Disease

## 2014-06-13 VITALS — BP 146/76 | HR 60 | Ht 75.0 in | Wt 196.0 lb

## 2014-06-13 DIAGNOSIS — I739 Peripheral vascular disease, unspecified: Secondary | ICD-10-CM

## 2014-06-13 NOTE — Progress Notes (Signed)
06/13/2014 [name]   01-Sep-1933  [id number]  Primary Physician Curly Rim, MD Primary Cardiologist: Lorretta Harp MD Renae Gloss   HPI:  .[name] is a 81 y.o. male with History of CAD with prior DES to LCX in 2006, repeat cath in 2011 and in June of 2014. He has chronic occlusion of the mid RCA and of the distal circumflex - managed medically, ischemic CM with EF 30 to 35%, ongoing tobacco abuse, SVT - on amiodarone, COPD, history of lung cancer and HTN. Has had recent GI bleed with colonoscopy/endoscopy performed in September 2015.  He has a history of peripheral arterial disease status post remote right common iliac artery stenting. Because of claudication he underwent PV angiogram on 11/20/13 and underwent successful restenting of ISR within the previously placed right common iliac artery stent. Discharged on DAPT. Also noted to have high grade, subtotal/total calcified SFAs bilaterally with 2 vessel runoff which Dr. [name] thought could be best treated with orbital rotational arthrectomy +- PTA/stenting. Followup arterial Dopplers revealed decreased callosities in the right common iliac artery from 469-319 cm/s. ABIs are unchanged as expected. Patient was recently seen by [name], nurse practitioner for lower extremity edema. She put him on when necessary Lasix 20 mg. He is also taking iron 3 times a day for anemia. Hemoglobin is stable at 9.7. He had percutaneous intervention of his right common iliac artery and at that time angiography documented occluded SFAs bilaterally. His right leg improved but he still has bilateral claudication left greater than right. I performed staged intervention on his left leg 01/04/14 using diamondback orbital rotational arthrectomy, PT and stenting of his proximal and mid left SFA resulting in marked improvement in his claudication as well as his lower extremity arterial Doppler studies which  essentially normalized on the left.  Current Outpatient Prescriptions  Medication Sig Dispense Refill  . acetaminophen (TYLENOL) 325 MG tablet Take 2 tablets (650 mg total) by mouth every 4 (four) hours as needed for headache or mild pain.    Marland Kitchen albuterol (PROVENTIL HFA;VENTOLIN HFA) 108 (90 BASE) MCG/ACT inhaler Inhale 2 puffs into the lungs every 4 (four) hours as needed for wheezing or shortness of breath.    Marland Kitchen amiodarone (PACERONE) 200 MG tablet Take 0.5 tablets (100 mg total) by mouth daily.    Marland Kitchen amLODipine (NORVASC) 10 MG tablet Take 0.5 tablets (5 mg total) by mouth daily. 90 tablet 6  . aspirin 81 MG chewable tablet Chew 1 tablet (81 mg total) by mouth daily.    . carbidopa-levodopa (SINEMET IR) 25-100 MG per tablet Take 0.5-1 tablets by mouth 3 (three) times daily. 90 tablet 6  . clopidogrel (PLAVIX) 75 MG tablet Take 1 tablet (75 mg total) by mouth daily. 30 tablet 11  . diazepam (VALIUM) 2 MG tablet     . ferrous sulfate 325 (65 FE) MG tablet Take 325 mg by mouth 3 (three) times daily with meals.    . fexofenadine (ALLEGRA) 180 MG tablet Take 180 mg by mouth daily as needed (allergies).     . furosemide (LASIX) 20 MG tablet Take 1 tablet (20 mg total) by mouth daily as needed. Daily as needed for swelling. Take two tablets daily for 14 days then return to one tablet daily 45 tablet 3  . isosorbide mononitrate (IMDUR) 60 MG 24 hr tablet Take 60 mg by mouth at bedtime.    Marland Kitchen losartan (COZAAR) 100 MG tablet Take 100 mg by mouth  daily.    . nitroGLYCERIN (NITROSTAT) 0.4 MG SL tablet Place 1 tablet (0.4 mg total) under the tongue every 5 (five) minutes as needed for chest pain (Chest pain). 25 tablet 6  . pantoprazole (PROTONIX) 40 MG tablet TAKE 1 TABLET ONCE A DAY 30 tablet 6  . polyethylene glycol (MIRALAX / GLYCOLAX) packet Take 17 g by mouth every other day.      No current facility-administered medications for this visit.    Allergies  Allergen Reactions  . Doxycycline      arthralgias  . Atorvastatin Rash  . Sulfa Drugs Cross Reactors Rash    History   Social History  . Marital Status: Widowed    Spouse Name: N/A  . Number of Children: 2  . Years of Education: 12th   Occupational History  . Retired     Nutritional therapist   Social History Main Topics  . Smoking status: Current Every Day Smoker -- 1.00 packs/day for 65 years    Types: Cigarettes  . Smokeless tobacco: Never Used  . Alcohol Use: Yes     Comment: quit 40+ years ago  . Drug Use: No  . Sexual Activity: No   Other Topics Concern  . Not on file   Social History Narrative   Patient lives at home alone.   Caffeine Use: several cups of coffee daily     Review of Systems: General: negative for chills, fever, night sweats or weight changes.  Cardiovascular: negative for chest pain, dyspnea on exertion, edema, orthopnea, palpitations, paroxysmal nocturnal dyspnea or shortness of breath Dermatological: negative for rash Respiratory: negative for cough or wheezing Urologic: negative for hematuria Abdominal: negative for nausea, vomiting, diarrhea, bright red blood per rectum, melena, or hematemesis Neurologic: negative for visual changes, syncope, or dizziness All other systems reviewed and are otherwise negative except as noted above.    Blood pressure 146/76, pulse 60, height '6\' 3"'$  (1.905 m), weight 196 lb (88.905 kg).  General appearance: alert and no distress Neck: no adenopathy, no JVD, supple, symmetrical, trachea midline, thyroid not enlarged, symmetric, no tenderness/mass/nodules and soft bilateral carotid bruits Lungs: clear to auscultation bilaterally Heart: regular rate and rhythm, S1, S2 normal, no murmur, click, rub or gallop Extremities: extremities normal, atraumatic, no cyanosis or edema  EKG not performed today  ASSESSMENT AND PLAN:   PAD-Lt SFA PTA 01/04/14 History of peripheral arterial disease status post remote right common iliac artery stenting with bilateral  lifestyle limiting claudication. I angiogram 10 11/20/13 revealing 75% "in-stent restenosis" within the right common iliac artery stent with a 35 mm gradient. I restarted this. He had bilateral SFA occlusions with 2 vessel runoff. I brought him back on 01/04/14 and performed diamondback orbital rotational atherectomy, PTA and stenting of his proximal and mid left SFA with an excellent angiographic and clinical results. His follow-up Dopplers performed 01/12/14 revealed a right ABI 0.55 with a patent stent in left ABI 0.97 with a patent left SFA. He is clearly improved but still complains of some right lower extremity claudication. He is scheduled for Dopplers next month and semiannually thereafter. I have offered him the option of proceeding with right SFA intervention.       Lorretta Harp MD FACP,FACC,FAHA, [facility] 06/13/2014 2:33 PM

## 2014-06-13 NOTE — Assessment & Plan Note (Signed)
History of peripheral arterial disease status post remote right common iliac artery stenting with bilateral lifestyle limiting claudication. I angiogram 10 11/20/13 revealing 75% "in-stent restenosis" within the right common iliac artery stent with a 35 mm gradient. I restarted this. He had bilateral SFA occlusions with 2 vessel runoff. I brought him back on 01/04/14 and performed diamondback orbital rotational atherectomy, PTA and stenting of his proximal and mid left SFA with an excellent angiographic and clinical results. His follow-up Dopplers performed 01/12/14 revealed a right ABI 0.55 with a patent stent in left ABI 0.97 with a patent left SFA. He is clearly improved but still complains of some right lower extremity claudication. He is scheduled for Dopplers next month and semiannually thereafter. I have offered him the option of proceeding with right SFA intervention.

## 2014-06-13 NOTE — Patient Instructions (Signed)
Please schedule your lower extremity arterial doppler today.  Dr [name] recommends that you schedule a follow-up appointment in 1 year. You will receive a reminder letter in the mail two months in advance. If you don't receive a letter, please call our office to schedule the follow-up appointment.

## 2014-06-18 ENCOUNTER — Other Ambulatory Visit: Payer: Self-pay | Admitting: Cardiology

## 2014-06-18 ENCOUNTER — Other Ambulatory Visit: Payer: Self-pay | Admitting: Cardiovascular Disease

## 2014-06-18 DIAGNOSIS — I739 Peripheral vascular disease, unspecified: Secondary | ICD-10-CM

## 2014-06-23 ENCOUNTER — Other Ambulatory Visit: Payer: Self-pay | Admitting: Cardiology

## 2014-06-25 NOTE — Telephone Encounter (Signed)
Rx refill sent to patient pharmacy   

## 2014-07-05 ENCOUNTER — Ambulatory Visit (INDEPENDENT_AMBULATORY_CARE_PROVIDER_SITE_OTHER): Payer: Medicare Other | Admitting: Cardiology

## 2014-07-05 ENCOUNTER — Encounter: Payer: Self-pay | Admitting: Cardiology

## 2014-07-05 ENCOUNTER — Ambulatory Visit (HOSPITAL_COMMUNITY)
Admission: RE | Admit: 2014-07-05 | Discharge: 2014-07-05 | Disposition: A | Discharge status: Home / Self Care | Payer: Medicare Other | Source: Ambulatory Visit | Attending: Cardiovascular Disease | Admitting: Cardiovascular Disease

## 2014-07-05 VITALS — BP 136/60 | HR 54 | Ht 75.0 in | Wt 192.9 lb

## 2014-07-05 DIAGNOSIS — I10 Essential (primary) hypertension: Secondary | ICD-10-CM

## 2014-07-05 DIAGNOSIS — I2510 Atherosclerotic heart disease of native coronary artery without angina pectoris: Secondary | ICD-10-CM

## 2014-07-05 DIAGNOSIS — I739 Peripheral vascular disease, unspecified: Secondary | ICD-10-CM | POA: Diagnosis present

## 2014-07-05 DIAGNOSIS — Z720 Tobacco use: Secondary | ICD-10-CM

## 2014-07-05 DIAGNOSIS — I480 Paroxysmal atrial fibrillation: Secondary | ICD-10-CM

## 2014-07-05 DIAGNOSIS — I255 Ischemic cardiomyopathy: Secondary | ICD-10-CM | POA: Diagnosis not present

## 2014-07-05 NOTE — Patient Instructions (Signed)
Continue your current therapy  I will see you in 6 months  You need to quit smoking

## 2014-07-05 NOTE — Progress Notes (Signed)
[name] Date of Birth: 1933/03/06 Medical Record #[id number]  History of Present Illness: [name] is seen for follow up CAD.  He is a 81 year old male with multiple issues. These include known CAD with prior DES to LCX in 2006, repeat cath in 2011 and in June of 2014. He has chronic occlusion of the mid RCA and of the distal circumflex - managed medically, ischemic CM with EF 30 to 35%, ongoing tobacco abuse, SVT - on amiodarone, COPD, history of lung cancer and HTN. Has had prior GI bleed with colonoscopy/endoscopy performed in September 2015.   Seen more recently with progressive claudication. Dr. [name] did angiogram and he had repeat stenting of the right iliac and later orbital atherectomy of the left SFA. He has occluded right SFA. He did get decreased claudication and improved strength in left leg. Still has claudication at 50 ft on right. Feels legs give out easily and has trouble with balance. He has seen Neurology and trial of anti Parkinsons drugs tried but he notes no improvement. Daughter doesn't think he is taking meds as prescribed.   He currently denies any chest pain. Has a chronic dry cough. CT of chest in November showed no change. Still smoking. No dyspnea.   Current Outpatient Prescriptions  Medication Sig Dispense Refill  . acetaminophen (TYLENOL) 325 MG tablet Take 2 tablets (650 mg total) by mouth every 4 (four) hours as needed for headache or mild pain.    Marland Kitchen albuterol (PROVENTIL HFA;VENTOLIN HFA) 108 (90 BASE) MCG/ACT inhaler Inhale 2 puffs into the lungs every 4 (four) hours as needed for wheezing or shortness of breath.    Marland Kitchen amiodarone (PACERONE) 200 MG tablet TAKE 1 TABLET DAILY 30 tablet 2  . amLODipine (NORVASC) 10 MG tablet Take 0.5 tablets (5 mg total) by mouth daily. 90 tablet 6  . aspirin 81 MG chewable tablet Chew 1 tablet (81 mg total) by mouth daily.    . carbidopa-levodopa (SINEMET IR) 25-100 MG per tablet Take 0.5-1 tablets by mouth 3 (three) times  daily. 90 tablet 6  . clopidogrel (PLAVIX) 75 MG tablet Take 1 tablet (75 mg total) by mouth daily. 30 tablet 11  . diazepam (VALIUM) 2 MG tablet     . ferrous sulfate 325 (65 FE) MG tablet Take 325 mg by mouth 3 (three) times daily with meals.    . fexofenadine (ALLEGRA) 180 MG tablet Take 180 mg by mouth daily as needed (allergies).     . furosemide (LASIX) 20 MG tablet Take 1 tablet (20 mg total) by mouth daily as needed. Daily as needed for swelling. Take two tablets daily for 14 days then return to one tablet daily 45 tablet 3  . HYDROCOD POLST-CHLORPHEN POLST Take 5 mLs by mouth.    . isosorbide mononitrate (IMDUR) 60 MG 24 hr tablet Take 60 mg by mouth at bedtime.    Marland Kitchen losartan (COZAAR) 100 MG tablet Take 100 mg by mouth daily.    . nitroGLYCERIN (NITROSTAT) 0.4 MG SL tablet Place 1 tablet (0.4 mg total) under the tongue every 5 (five) minutes as needed for chest pain (Chest pain). 25 tablet 6  . pantoprazole (PROTONIX) 40 MG tablet TAKE 1 TABLET ONCE A DAY 30 tablet 6  . polyethylene glycol (MIRALAX / GLYCOLAX) packet Take 17 g by mouth every other day.     Marland Kitchen amoxicillin-clavulanate (AUGMENTIN) 875-125 MG per tablet Take 1 tablet by mouth 2 (two) times daily.  No current facility-administered medications for this visit.    Allergies  Allergen Reactions  . Doxycycline     arthralgias  . Atorvastatin Rash  . Sulfa Drugs Cross Reactors Rash  . Zocor  [Simvastatin] Rash    Past Medical History  Diagnosis Date  . CAD (coronary artery disease)     a. s/p DES-LCx 2006 b. repeat cath 2011- 90% distal LCx ISR, 100% mid RCA CTO, diffuse nonobstructive dz, medical management c. 07/2012 cath- 100% distal LCx ISR, 100% mid RCA CTO, nonobstructive dz; EF 40-45%  . PVC (premature ventricular contraction)   . HTN (hypertension)   . Paroxysmal a-fib   . PAD (peripheral artery disease)     history of stenting in the past  . COPD (chronic obstructive pulmonary disease)   . LV dysfunction     . Anemia, iron deficiency   . Family history of anesthesia complication     "daughter gets PONV" (11/20/2013)  . Anginal pain   . Pneumonia 1-2 X's  . History of blood transfusion     "he's had several; probably related to his cancer" (11/20/2013)  . GERD (gastroesophageal reflux disease)   . H/O hiatal hernia   . History of gastric ulcer   . Arthritis     "generalized"  . Chronic lower back pain   . Anxiety   . Myocardial infarction 09/2009; ~ 2014    [name] 10/24/2009;   . Lung cancer dx'd 11/2005    non small cell    Past Surgical History  Procedure Laterality Date  . Lobectomy Left     left upper  . Appendectomy    . Anterior cervical decomp/discectomy fusion    . Twenty four hour intraesophageal ph profile.  05/02/2001  . Aortogram with bilateral lower extremity runoff.  07/11/2003  . Fiberoptic bronchoscopy, mediastinum.  10/20/2005  . Video assisted thoracoscopy Left 12/01/2005  . Esophagogastroduodenoscopy with biopsy.  04/14/2010  . Iliac artery stent Right 11/20/2013  . Tonsillectomy  1972  . Inguinal hernia repair Bilateral     "I've had several"  . Back surgery      "I've had several back surgeries; got spacers down in lower back" (11/20/2013)  . Cataract extraction w/ intraocular lens  implant, bilateral Bilateral   . Coronary angioplasty with stent placement  2006    DES to LCX/notes 01/04/2014  . Cardiac catheterization  2011; 07/2012    [name] 01/04/2014  . Femoral artery stent Left 01/04/2014  . Left heart catheterization with coronary angiogram N/A 08/19/2012    Procedure: LEFT HEART CATHETERIZATION WITH CORONARY ANGIOGRAM;  Surgeon: Burnell Blanks, MD;  Location: [facility] CATH LAB;  Service: Cardiovascular;  Laterality: N/A;  . Lower extremity angiogram N/A 11/20/2013    Procedure: LOWER EXTREMITY ANGIOGRAM;  Surgeon: Lorretta Harp, MD;  Location[facility] CATH LAB;  Service: Cardiovascular;  Laterality: N/A;  . Lower extremity angiogram N/A 01/04/2014     Procedure: LOWER EXTREMITY ANGIOGRAM;  Surgeon: Lorretta Harp, MD;  Location: [facility] CATH LAB;  Service: Cardiovascular;  Laterality: N/A;    History  Smoking status  . Current Every Day Smoker -- 1.00 packs/day for 65 years  . Types: Cigarettes  Smokeless tobacco  . Never Used    History  Alcohol Use  . Yes    Comment: quit 40+ years ago    Family History  Problem Relation Age of Onset  . Heart Problems Mother   . Breast cancer Mother   . Other Father  Motorcycle Accident  . Breast cancer Sister     Review of Systems: The review of systems is per the HPI.  All other systems were reviewed and are negative.  Physical Exam: BP 136/60 mmHg  Pulse 54  Ht '6\' 3"'$  (1.905 m)  Wt 192 lb 14.4 oz (87.499 kg)  BMI 24.11 kg/m2 Patient is very pleasant and in no acute distress. Chronically ill appearing. Skin is warm and dry. Color is normal.  HEENT is unremarkable. Normocephalic/atraumatic. PERRL. Sclera are nonicteric. Neck is supple. No masses. No JVD. Lungs are coarse with scattered ronchi. Cardiac exam shows a regular rate and rhythm. Abdomen is soft. Extremities are with tr edema. Reduced pedal pulse on the right. Gait and ROM are intact. No gross neurologic deficits noted.  Wt Readings from Last 3 Encounters:  07/05/14 192 lb 14.4 oz (87.499 kg)  06/13/14 196 lb (88.905 kg)  02/06/14 193 lb (87.544 kg)    LABORATORY DATA/PROCEDURES:  Lab Results  Component Value Date   WBC 11.4* 01/05/2014   HGB 11.3* 01/05/2014   HCT 35.3* 01/05/2014   PLT 239 01/05/2014   GLUCOSE 119* 01/05/2014   CHOL 166 06/16/2011   TRIG 70.0 06/16/2011   HDL 61.30 06/16/2011   LDLCALC 91 06/16/2011   ALT 13 01/05/2014   AST 20 01/05/2014   NA 133* 01/05/2014   K 4.5 01/05/2014   CL 93* 01/05/2014   CREATININE 0.72 01/05/2014   BUN 9 01/05/2014   CO2 27 01/05/2014   TSH 2.149 11/15/2013   TSH 1.879 11/15/2013   INR 1.02 12/28/2013    BNP (last 3 results)  Recent Labs   11/10/13 1944 11/29/13 1153  PROBNP 224.4 123.0*   Assessment / Plan: 1. PAD s/p repeat stent right iliac. S/p orbital atherectomy of left SFA. Occluded right SFA. - now on DAPT. Repeat dopplers ordered today.   2. Chronic cough. CT stable in Nov. Suspect this is a smoker's cough.  3. History of lung CA s/p resection.   4. CAD - no active chest pain -continue medical therapy.  5. HTN - well controlled.  6. SVT/ PAfib well controlled on amiodarone- now on 100 mg daily. Poor candidate for anticoagulation with prior GI bleed. Continue DAPT for now.

## 2014-07-16 ENCOUNTER — Other Ambulatory Visit: Payer: Self-pay | Admitting: Cardiology

## 2014-07-17 MED ORDER — ISOSORBIDE MONONITRATE ER 60 MG PO TB24: 60.0000 mg | ORAL_TABLET | Freq: Every day | ORAL | Status: DC

## 2014-07-17 NOTE — Telephone Encounter (Signed)
Should this be qd or bid? The chart indicates that he takes this qd, but the requests and previous refills have been for bid. I did see a note on one of the previous refills that the patient has been taking one qhs because he is trying to save money. Please advise. Thanks, MI

## 2014-11-22 ENCOUNTER — Emergency Department (HOSPITAL_BASED_OUTPATIENT_CLINIC_OR_DEPARTMENT_OTHER): Payer: Medicare Other

## 2014-11-22 ENCOUNTER — Encounter (HOSPITAL_BASED_OUTPATIENT_CLINIC_OR_DEPARTMENT_OTHER): Payer: Self-pay

## 2014-11-22 ENCOUNTER — Emergency Department (HOSPITAL_BASED_OUTPATIENT_CLINIC_OR_DEPARTMENT_OTHER)
Admission: EM | Admit: 2014-11-22 | Discharge: 2014-11-23 | Disposition: A | Discharge status: Home / Self Care | Payer: Medicare Other | Source: Home / Self Care | Attending: Emergency Medicine | Admitting: Emergency Medicine

## 2014-11-22 DIAGNOSIS — H6011 Cellulitis of right external ear: Secondary | ICD-10-CM

## 2014-11-22 DIAGNOSIS — R070 Pain in throat: Secondary | ICD-10-CM | POA: Diagnosis not present

## 2014-11-22 DIAGNOSIS — B029 Zoster without complications: Secondary | ICD-10-CM | POA: Diagnosis not present

## 2014-11-22 DIAGNOSIS — H6091 Unspecified otitis externa, right ear: Secondary | ICD-10-CM

## 2014-11-22 LAB — BASIC METABOLIC PANEL
ANION GAP: 7 (ref 5–15)
BUN: 14 mg/dL (ref 6–20)
CALCIUM: 10.6 mg/dL — AB (ref 8.9–10.3)
CO2: 29 mmol/L (ref 22–32)
Chloride: 98 mmol/L — ABNORMAL LOW (ref 101–111)
Creatinine, Ser: 0.98 mg/dL (ref 0.61–1.24)
GFR calc non Af Amer: >60 mL/min (ref >60)
Glucose, Bld: 139 mg/dL — ABNORMAL HIGH (ref 65–99)
POTASSIUM: 3.5 mmol/L (ref 3.5–5.1)
Sodium: 134 mmol/L — ABNORMAL LOW (ref 135–145)

## 2014-11-22 LAB — CBC
HEMATOCRIT: 39.8 % (ref 39.0–52.0)
HEMOGLOBIN: 13.4 g/dL (ref 13.0–17.0)
MCH: 31 pg (ref 26.0–34.0)
MCHC: 33.7 g/dL (ref 30.0–36.0)
MCV: 92.1 fL (ref 78.0–100.0)
Platelets: 168 10*3/uL (ref 150–400)
RBC: 4.32 MIL/uL (ref 4.22–5.81)
RDW: 14.2 % (ref 11.5–15.5)
WBC: 5.8 10*3/uL (ref 4.0–10.5)

## 2014-11-22 LAB — RAPID STREP SCREEN (MED CTR MEBANE ONLY): STREPTOCOCCUS, GROUP A SCREEN (DIRECT): NEGATIVE

## 2014-11-22 MED ORDER — ALBUTEROL SULFATE (2.5 MG/3ML) 0.083% IN NEBU
5.0000 mg | INHALATION_SOLUTION | Freq: Once | RESPIRATORY_TRACT | Status: AC
  Administered 2014-11-22: 5 mg via RESPIRATORY_TRACT
  Filled 2014-11-22: qty 6

## 2014-11-22 MED ORDER — IPRATROPIUM BROMIDE 0.02 % IN SOLN
0.5000 mg | Freq: Once | RESPIRATORY_TRACT | Status: AC
  Administered 2014-11-22: 0.5 mg via RESPIRATORY_TRACT
  Filled 2014-11-22: qty 2.5

## 2014-11-22 MED ORDER — CIPROFLOXACIN-DEXAMETHASONE 0.3-0.1 % OT SUSP: 4.0000 [drp] | Freq: Two times a day (BID) | OTIC | Status: DC

## 2014-11-22 NOTE — ED Notes (Signed)
Pt has had a productive cough for a month, he has had some recent throat pain and cellulitis on the right ear for the past two weeks, subjective fever at home and weakness.  Pt saw PCP yesterday and was put on Keflex for his ear infection, daughter states PCP was not sure if he had strep or thrush.

## 2014-11-22 NOTE — ED Provider Notes (Signed)
CSN: [id number]     Arrival date & time 11/22/14  2112 History  By signing my name below, I, Tula Nakayama, attest that this documentation has been prepared under the direction and in the presence of Braniyah Besse, MD  Electronically Signed: Tula Nakayama, ED Scribe. 11/22/2014. 11:30 PM   Chief Complaint  Patient presents with  . Cough   Patient is a 81 y.o. male presenting with cough and ear pain. The history is provided by the patient. No language interpreter was used.  Cough Cough characteristics:  Productive Severity:  Moderate Onset quality:  Gradual Duration:  2 weeks Timing:  Intermittent Progression:  Unchanged Chronicity:  New Associated symptoms: ear pain and sore throat   Associated symptoms: no chest pain, no fever, no rhinorrhea and no shortness of breath   Otalgia Location:  Right Behind ear:  No abnormality Quality:  Aching Severity:  Moderate Onset quality:  Gradual Timing:  Constant Progression:  Unchanged Chronicity:  New Context: not direct blow   Relieved by:  Nothing Worsened by:  Nothing tried Ineffective treatments: multiple antibiotics. Associated symptoms: congestion, cough and sore throat   Associated symptoms: no fever and no rhinorrhea   Risk factors: no prior ear surgery     HPI Comments: ZIMRI BRENNEN is a 81 y.o. male accompanied by his daughter who presents to the Emergency Department complaining of intermittent, moderate cough and congestion that started 2 weeks ago. His daughter states HA, sore throat and right ear pain as associated symptoms. She also notes a possible thrush reaction in his mouth. Pt was seen by his PCP yesterday who prescribed Keflex for an ear infection. He was also started on Magic Mouth Wash and anti fungals at that visit. He has also tried Cipro and, then, Augmentin which caused adverse reaction of myalgias. Pt's family denies trauma to his ear. She also denies fever.   PCP [place]  Past Medical History   Diagnosis Date  . CAD (coronary artery disease)     a. s/p DES-LCx 2006 b. repeat cath 2011- 90% distal LCx ISR, 100% mid RCA CTO, diffuse nonobstructive dz, medical management c. 07/2012 cath- 100% distal LCx ISR, 100% mid RCA CTO, nonobstructive dz; EF 40-45%  . PVC (premature ventricular contraction)   . HTN (hypertension)   . Paroxysmal a-fib   . PAD (peripheral artery disease)     history of stenting in the past  . COPD (chronic obstructive pulmonary disease)   . LV dysfunction   . Anemia, iron deficiency   . Family history of anesthesia complication     "daughter gets PONV" (11/20/2013)  . Anginal pain   . Pneumonia 1-2 X's  . History of blood transfusion     "he's had several; probably related to his cancer" (11/20/2013)  . GERD (gastroesophageal reflux disease)   . H/O hiatal hernia   . History of gastric ulcer   . Arthritis     "generalized"  . Chronic lower back pain   . Anxiety   . Myocardial infarction 09/2009; ~ 2014    [name] 10/24/2009;   . 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 Lobectomy Left     left upper  . Appendectomy    . Anterior cervical decomp/discectomy fusion    . Twenty four hour intraesophageal ph profile.  05/02/2001  . Aortogram with bilateral lower extremity runoff.  07/11/2003  . Fiberoptic bronchoscopy, mediastinum.  10/20/2005  .  Video assisted thoracoscopy Left 12/01/2005  . Esophagogastroduodenoscopy with biopsy.  04/14/2010  . Iliac artery stent Right 11/20/2013  . Tonsillectomy  1972  . Inguinal hernia repair Bilateral     "I've had several"  . Back surgery      "I've had several back surgeries; got spacers down in lower back" (11/20/2013)  . Cataract extraction w/ intraocular lens  implant, bilateral Bilateral   . Coronary angioplasty with stent placement  2006    DES to LCX/notes 01/04/2014  . Cardiac catheterization  2011; 07/2012    [name] 01/04/2014  . Femoral artery stent  Left 01/04/2014  . Left heart catheterization with coronary angiogram N/A 08/19/2012    Procedure: LEFT HEART CATHETERIZATION WITH CORONARY ANGIOGRAM;  Surgeon: Burnell Blanks, MD;  Location: [facility] CATH LAB;  Service: Cardiovascular;  Laterality: N/A;  . Lower extremity angiogram N/A 11/20/2013    Procedure: LOWER EXTREMITY ANGIOGRAM;  Surgeon: Lorretta Harp, MD;  Location: [facility] CATH LAB;  Service: Cardiovascular;  Laterality: N/A;  . Lower extremity angiogram N/A 01/04/2014    Procedure: LOWER EXTREMITY ANGIOGRAM;  Surgeon: Lorretta Harp, MD;  Location: [facility] CATH LAB;  Service: Cardiovascular;  Laterality: N/A;   Family History  Problem Relation Age of Onset  . Heart Problems Mother   . Breast cancer Mother   . Other Father     Motorcycle Accident  . Breast cancer Sister    Social History  Substance Use Topics  . Smoking status: Current Every Day Smoker -- 1.00 packs/day for 65 years    Types: Cigarettes  . Smokeless tobacco: Never Used  . Alcohol Use: Yes     Comment: quit 40+ years ago    Review of Systems  Constitutional: Negative for fever and appetite change.  HENT: Positive for congestion, ear pain and sore throat. Negative for drooling, postnasal drip and rhinorrhea.   Eyes: Negative for photophobia.  Respiratory: Positive for cough. Negative for shortness of breath.   Cardiovascular: Negative for chest pain, palpitations and leg swelling.  All other systems reviewed and are negative.     Allergies  Ciprofloxacin; Doxycycline; Atorvastatin; Sulfa drugs cross reactors; and Zocor   Home Medications   Prior to Admission medications   Medication Sig Start Date End Date Taking? Authorizing Provider  acetaminophen (TYLENOL) 325 MG tablet Take 2 tablets (650 mg total) by mouth every 4 (four) hours as needed for headache or mild pain. 01/05/14   Erlene Quan, PA-C  albuterol (PROVENTIL HFA;VENTOLIN HFA) 108 (90 BASE) MCG/ACT inhaler Inhale 2 puffs into the lungs  every 4 (four) hours as needed for wheezing or shortness of breath.    Historical Provider, MD  amiodarone (PACERONE) 200 MG tablet TAKE 1 TABLET DAILY 06/25/14   Peter M Martinique, MD  amLODipine (NORVASC) 10 MG tablet Take 0.5 tablets (5 mg total) by mouth daily. 11/29/13   Burtis Junes, NP  amoxicillin-clavulanate (AUGMENTIN) 875-125 MG per tablet Take 1 tablet by mouth 2 (two) times daily. 06/26/14   Historical Provider, MD  aspirin 81 MG chewable tablet Chew 1 tablet (81 mg total) by mouth daily. 01/05/14   Erlene Quan, PA-C  carbidopa-levodopa (SINEMET IR) 25-100 MG per tablet Take 0.5-1 tablets by mouth 3 (three) times daily. 01/24/14   Penni Bombard, MD  clopidogrel (PLAVIX) 75 MG tablet Take 1 tablet (75 mg total) by mouth daily. 01/05/14   Erlene Quan, PA-C  diazepam (VALIUM) 2 MG tablet  12/19/13   Historical Provider,  MD  ferrous sulfate 325 (65 FE) MG tablet Take 325 mg by mouth 3 (three) times daily with meals. 11/13/13   Modena Jansky, MD  fexofenadine (ALLEGRA) 180 MG tablet Take 180 mg by mouth daily as needed (allergies).     Historical Provider, MD  furosemide (LASIX) 20 MG tablet Take 1 tablet (20 mg total) by mouth daily as needed. Daily as needed for swelling. Take two tablets daily for 14 days then return to one tablet daily 01/12/14   Lorretta Harp, MD  isosorbide mononitrate (IMDUR) 60 MG 24 hr tablet Take 1 tablet (60 mg total) by mouth at bedtime. 07/17/14   Peter M Martinique, MD  losartan (COZAAR) 100 MG tablet Take 100 mg by mouth daily.    Historical Provider, MD  nitroGLYCERIN (NITROSTAT) 0.4 MG SL tablet Place 1 tablet (0.4 mg total) under the tongue every 5 (five) minutes as needed for chest pain (Chest pain). 11/29/13   Burtis Junes, NP  pantoprazole (PROTONIX) 40 MG tablet TAKE 1 TABLET ONCE A DAY 04/16/14   Lorretta Harp, MD  polyethylene glycol [facility] / GLYCOLAX) packet Take 17 g by mouth every other day.     Historical Provider, MD   BP 117/55 mmHg   Pulse 61  Temp(Src) 98.7 F (37.1 C) (Oral)  Resp 18  Ht '6\' 3"'$  (1.905 m)  Wt 188 lb (85.276 kg)  BMI 23.50 kg/m2  SpO2 100% Physical Exam  Constitutional: He appears well-developed and well-nourished. No distress.  HENT:  Head: Normocephalic and atraumatic.  Right Ear: There is drainage. No mastoid tenderness. Tympanic membrane is not injected and not scarred. No hemotympanum.  Ears:  Mouth/Throat: Oropharynx is clear and moist. No oropharyngeal exudate.  Swelling of pinna of right ear External canal and TM is normal Mastoid intact No turning out of the ear  Eyes: Conjunctivae and EOM are normal. Pupils are equal, round, and reactive to light.  Neck: Normal range of motion. Neck supple. No tracheal deviation present.  Cardiovascular: Normal rate, regular rhythm and normal heart sounds.  Exam reveals no gallop and no friction rub.   No murmur heard. Pulmonary/Chest: Effort normal and breath sounds normal. No respiratory distress. He has no wheezes. He has no rales.  No wheezes, rales, rhonchi or rubs  Abdominal: Soft. There is no tenderness. There is no rebound and no guarding.  Hyperactive bowel sounds  Musculoskeletal: He exhibits no edema.  Compartments soft No cords  Lymphadenopathy:    He has no cervical adenopathy.  Neurological: He has normal reflexes.  Skin: Skin is warm and dry.  Psychiatric: He has a normal mood and affect. His behavior is normal.  Nursing note and vitals reviewed.   ED Course  Procedures   DIAGNOSTIC STUDIES: Oxygen Saturation is 100% on RA, normal by my interpretation.    COORDINATION OF CARE: 11:26 PM Discussed x-ray and lab results with pt's family. Discussed treatment plan with pt which includes referral to ENT and Keflex ear drops. Pt agreed to plan.   Labs Review Labs Reviewed  BASIC METABOLIC PANEL - Abnormal; Notable for the following:    Sodium 134 (*)    Chloride 98 (*)    Glucose, Bld 139 (*)    Calcium 10.6 (*)    All  other components within normal limits  RAPID STREP SCREEN (NOT AT [facility])  CULTURE, GROUP A STREP  CBC    Imaging Review Dg Chest 2 View  11/22/2014   CLINICAL DATA:  Cough for 1 month  EXAM: CHEST  2 VIEW  COMPARISON:  November 15, 2013  FINDINGS: The heart size and mediastinal contours are within normal limits. There is left apical pleural thickening and scarring unchanged. Surgical clips are identified in the left hilum unchanged. The visualized skeletal structures are unremarkable.  IMPRESSION: No active cardiopulmonary disease.  Postsurgical changes of the left lung unchanged. Left apical pleural thickening and scarring are unchanged.   Electronically Signed   By: Abelardo Diesel M.D.   On: 11/22/2014 22:17   I have personally reviewed and evaluated these images and lab results as part of my medical decision-making.   EKG Interpretation None      MDM   Final diagnoses:  None    Will start ciprodex ear drops for otitis externa (and since these have never been tried and have little systemic absorption)   in an addition to the keflex the patient is taking for his infection of the pinna.  Labs are normal.  No PNA.  Just started on antifungals for thrush as source of his sore throat.  At this point since the ear is not improved he will need to see ENT for further evaluation of the swelling of the pinna for > 1 month with failure of 2 different antibiotics.  Family verbalizes understanding and agrees to follow up  I personally performed the services described in this documentation, which was scribed in my presence. The recorded information has been reviewed and is accurate.     Veatrice Kells, MD 11/23/14 [phone]

## 2014-11-22 NOTE — Discharge Instructions (Signed)

## 2014-11-23 ENCOUNTER — Encounter (HOSPITAL_BASED_OUTPATIENT_CLINIC_OR_DEPARTMENT_OTHER): Payer: Self-pay | Admitting: Emergency Medicine

## 2014-11-23 ENCOUNTER — Inpatient Hospital Stay (HOSPITAL_COMMUNITY)
Admission: EM | Admit: 2014-11-23 | Discharge: 2014-11-30 | DRG: 595 | Disposition: A | Discharge status: Skilled Nursing Facility | Payer: Medicare Other | Attending: Internal Medicine | Admitting: Internal Medicine

## 2014-11-23 ENCOUNTER — Encounter (HOSPITAL_COMMUNITY): Payer: Self-pay | Admitting: Family Medicine

## 2014-11-23 DIAGNOSIS — R070 Pain in throat: Secondary | ICD-10-CM

## 2014-11-23 DIAGNOSIS — B370 Candidal stomatitis: Secondary | ICD-10-CM | POA: Diagnosis present

## 2014-11-23 DIAGNOSIS — E46 Unspecified protein-calorie malnutrition: Secondary | ICD-10-CM | POA: Diagnosis present

## 2014-11-23 DIAGNOSIS — Z7902 Long term (current) use of antithrombotics/antiplatelets: Secondary | ICD-10-CM

## 2014-11-23 DIAGNOSIS — G20 Parkinson's disease: Secondary | ICD-10-CM | POA: Diagnosis present

## 2014-11-23 DIAGNOSIS — Z981 Arthrodesis status: Secondary | ICD-10-CM

## 2014-11-23 DIAGNOSIS — I2510 Atherosclerotic heart disease of native coronary artery without angina pectoris: Secondary | ICD-10-CM | POA: Diagnosis present

## 2014-11-23 DIAGNOSIS — I255 Ischemic cardiomyopathy: Secondary | ICD-10-CM | POA: Diagnosis present

## 2014-11-23 DIAGNOSIS — I739 Peripheral vascular disease, unspecified: Secondary | ICD-10-CM | POA: Diagnosis present

## 2014-11-23 DIAGNOSIS — E43 Unspecified severe protein-calorie malnutrition: Secondary | ICD-10-CM | POA: Insufficient documentation

## 2014-11-23 DIAGNOSIS — B029 Zoster without complications: Secondary | ICD-10-CM | POA: Diagnosis present

## 2014-11-23 DIAGNOSIS — Z7982 Long term (current) use of aspirin: Secondary | ICD-10-CM

## 2014-11-23 DIAGNOSIS — I480 Paroxysmal atrial fibrillation: Secondary | ICD-10-CM | POA: Diagnosis present

## 2014-11-23 DIAGNOSIS — I252 Old myocardial infarction: Secondary | ICD-10-CM

## 2014-11-23 DIAGNOSIS — J449 Chronic obstructive pulmonary disease, unspecified: Secondary | ICD-10-CM | POA: Diagnosis present

## 2014-11-23 DIAGNOSIS — R739 Hyperglycemia, unspecified: Secondary | ICD-10-CM | POA: Diagnosis present

## 2014-11-23 DIAGNOSIS — K219 Gastro-esophageal reflux disease without esophagitis: Secondary | ICD-10-CM | POA: Diagnosis present

## 2014-11-23 DIAGNOSIS — Z85118 Personal history of other malignant neoplasm of bronchus and lung: Secondary | ICD-10-CM

## 2014-11-23 DIAGNOSIS — Z79899 Other long term (current) drug therapy: Secondary | ICD-10-CM

## 2014-11-23 DIAGNOSIS — F17210 Nicotine dependence, cigarettes, uncomplicated: Secondary | ICD-10-CM | POA: Diagnosis present

## 2014-11-23 DIAGNOSIS — M545 Low back pain: Secondary | ICD-10-CM | POA: Diagnosis present

## 2014-11-23 DIAGNOSIS — F419 Anxiety disorder, unspecified: Secondary | ICD-10-CM | POA: Diagnosis present

## 2014-11-23 DIAGNOSIS — R638 Other symptoms and signs concerning food and fluid intake: Secondary | ICD-10-CM | POA: Diagnosis present

## 2014-11-23 DIAGNOSIS — G510 Bell's palsy: Secondary | ICD-10-CM | POA: Insufficient documentation

## 2014-11-23 DIAGNOSIS — Z881 Allergy status to other antibiotic agents status: Secondary | ICD-10-CM

## 2014-11-23 DIAGNOSIS — Z888 Allergy status to other drugs, medicaments and biological substances status: Secondary | ICD-10-CM

## 2014-11-23 DIAGNOSIS — E876 Hypokalemia: Secondary | ICD-10-CM | POA: Diagnosis present

## 2014-11-23 DIAGNOSIS — G8929 Other chronic pain: Secondary | ICD-10-CM | POA: Diagnosis present

## 2014-11-23 DIAGNOSIS — Z955 Presence of coronary angioplasty implant and graft: Secondary | ICD-10-CM

## 2014-11-23 DIAGNOSIS — I10 Essential (primary) hypertension: Secondary | ICD-10-CM | POA: Diagnosis present

## 2014-11-23 DIAGNOSIS — R1310 Dysphagia, unspecified: Secondary | ICD-10-CM | POA: Diagnosis present

## 2014-11-23 DIAGNOSIS — R29810 Facial weakness: Secondary | ICD-10-CM

## 2014-11-23 DIAGNOSIS — E871 Hypo-osmolality and hyponatremia: Secondary | ICD-10-CM | POA: Diagnosis present

## 2014-11-23 LAB — PHOSPHORUS: Phosphorus: 2.4 mg/dL — ABNORMAL LOW (ref 2.5–4.6)

## 2014-11-23 LAB — CBC
HCT: 40.7 % (ref 39.0–52.0)
HEMOGLOBIN: 13.5 g/dL (ref 13.0–17.0)
MCH: 30.5 pg (ref 26.0–34.0)
MCHC: 33.2 g/dL (ref 30.0–36.0)
MCV: 91.9 fL (ref 78.0–100.0)
Platelets: 184 10*3/uL (ref 150–400)
RBC: 4.43 MIL/uL (ref 4.22–5.81)
RDW: 14.3 % (ref 11.5–15.5)
WBC: 5.3 10*3/uL (ref 4.0–10.5)

## 2014-11-23 LAB — COMPREHENSIVE METABOLIC PANEL
ALBUMIN: 3.5 g/dL (ref 3.5–5.0)
ALK PHOS: 83 U/L (ref 38–126)
ALT: 11 U/L — ABNORMAL LOW (ref 17–63)
ANION GAP: 7 (ref 5–15)
AST: 36 U/L (ref 15–41)
BILIRUBIN TOTAL: 1 mg/dL (ref 0.3–1.2)
BUN: 9 mg/dL (ref 6–20)
CALCIUM: 11.2 mg/dL — AB (ref 8.9–10.3)
CO2: 27 mmol/L (ref 22–32)
Chloride: 99 mmol/L — ABNORMAL LOW (ref 101–111)
Creatinine, Ser: 0.91 mg/dL (ref 0.61–1.24)
GFR calc Af Amer: >60 mL/min (ref >60)
GLUCOSE: 116 mg/dL — AB (ref 65–99)
Potassium: 3.8 mmol/L (ref 3.5–5.1)
Sodium: 133 mmol/L — ABNORMAL LOW (ref 135–145)
TOTAL PROTEIN: 6.8 g/dL (ref 6.5–8.1)

## 2014-11-23 LAB — GLUCOSE, CAPILLARY: GLUCOSE-CAPILLARY: 166 mg/dL — AB (ref 65–99)

## 2014-11-23 LAB — MAGNESIUM: Magnesium: 2 mg/dL (ref 1.7–2.4)

## 2014-11-23 MED ORDER — ALBUTEROL SULFATE HFA 108 (90 BASE) MCG/ACT IN AERS: 2.0000 | INHALATION_SPRAY | RESPIRATORY_TRACT | Status: DC | PRN

## 2014-11-23 MED ORDER — DEXAMETHASONE SODIUM PHOSPHATE 10 MG/ML IJ SOLN
10.0000 mg | Freq: Once | INTRAMUSCULAR | Status: AC
  Administered 2014-11-23: 10 mg via INTRAVENOUS
  Filled 2014-11-23: qty 1

## 2014-11-23 MED ORDER — LIDOCAINE VISCOUS 2 % MT SOLN
15.0000 mL | Freq: Four times a day (QID) | OROMUCOSAL | Status: DC | PRN
  Administered 2014-11-25: 15 mL via OROMUCOSAL
  Filled 2014-11-23 (×2): qty 15

## 2014-11-23 MED ORDER — ALBUTEROL SULFATE (2.5 MG/3ML) 0.083% IN NEBU
2.5000 mg | INHALATION_SOLUTION | RESPIRATORY_TRACT | Status: DC | PRN
  Administered 2014-11-24 – 2014-11-30 (×7): 2.5 mg via RESPIRATORY_TRACT
  Filled 2014-11-23 (×7): qty 3

## 2014-11-23 MED ORDER — DEXTROSE 5 % IV SOLN
850.0000 mg | Freq: Three times a day (TID) | INTRAVENOUS | Status: DC
Start: 2014-11-23 — End: 2014-11-30
  Administered 2014-11-23 – 2014-11-30 (×22): 850 mg via INTRAVENOUS
  Filled 2014-11-23 (×26): qty 17

## 2014-11-23 MED ORDER — AMIODARONE HCL 200 MG PO TABS
200.0000 mg | ORAL_TABLET | Freq: Every day | ORAL | Status: DC
  Administered 2014-11-24 – 2014-11-30 (×7): 200 mg via ORAL
  Filled 2014-11-23 (×8): qty 1

## 2014-11-23 MED ORDER — ACETAMINOPHEN 650 MG RE SUPP: 650.0000 mg | Freq: Four times a day (QID) | RECTAL | Status: DC | PRN

## 2014-11-23 MED ORDER — NITROGLYCERIN 0.4 MG SL SUBL: 0.4000 mg | SUBLINGUAL_TABLET | SUBLINGUAL | Status: DC | PRN

## 2014-11-23 MED ORDER — PANTOPRAZOLE SODIUM 40 MG PO TBEC
40.0000 mg | DELAYED_RELEASE_TABLET | Freq: Every day | ORAL | Status: DC
  Administered 2014-11-23 – 2014-11-30 (×8): 40 mg via ORAL
  Filled 2014-11-23 (×8): qty 1

## 2014-11-23 MED ORDER — ACETAMINOPHEN 325 MG PO TABS: 650.0000 mg | ORAL_TABLET | Freq: Four times a day (QID) | ORAL | Status: DC | PRN

## 2014-11-23 MED ORDER — OXYCODONE HCL 5 MG PO TABS
5.0000 mg | ORAL_TABLET | ORAL | Status: DC | PRN
  Administered 2014-11-26 – 2014-11-29 (×7): 5 mg via ORAL
  Filled 2014-11-23 (×7): qty 1

## 2014-11-23 MED ORDER — MORPHINE SULFATE (PF) 2 MG/ML IV SOLN
1.0000 mg | INTRAVENOUS | Status: DC | PRN
  Administered 2014-11-25 – 2014-11-26 (×3): 1 mg via INTRAVENOUS
  Filled 2014-11-23 (×3): qty 1

## 2014-11-23 MED ORDER — CLOPIDOGREL BISULFATE 75 MG PO TABS
75.0000 mg | ORAL_TABLET | Freq: Every day | ORAL | Status: DC
  Administered 2014-11-23 – 2014-11-30 (×8): 75 mg via ORAL
  Filled 2014-11-23 (×9): qty 1

## 2014-11-23 MED ORDER — MAGIC MOUTHWASH
5.0000 mL | Freq: Once | ORAL | Status: AC
  Administered 2014-11-23: 5 mL via ORAL
  Filled 2014-11-23: qty 5

## 2014-11-23 MED ORDER — TETRACAINE HCL 0.5 % OP SOLN
1.0000 [drp] | Freq: Once | OPHTHALMIC | Status: AC
  Administered 2014-11-23: 1 [drp] via OPHTHALMIC
  Filled 2014-11-23: qty 2

## 2014-11-23 MED ORDER — CARBIDOPA-LEVODOPA 25-100 MG PO TABS
1.0000 | ORAL_TABLET | Freq: Three times a day (TID) | ORAL | Status: DC
  Administered 2014-11-23 – 2014-11-30 (×20): 1 via ORAL
  Filled 2014-11-23 (×21): qty 1

## 2014-11-23 MED ORDER — HEPARIN SODIUM (PORCINE) 5000 UNIT/ML IJ SOLN
5000.0000 [IU] | Freq: Three times a day (TID) | INTRAMUSCULAR | Status: DC
  Administered 2014-11-23 – 2014-11-30 (×19): 5000 [IU] via SUBCUTANEOUS
  Filled 2014-11-23 (×20): qty 1

## 2014-11-23 MED ORDER — DIAZEPAM 2 MG PO TABS
2.0000 mg | ORAL_TABLET | Freq: Three times a day (TID) | ORAL | Status: DC | PRN
Start: 2014-11-23 — End: 2014-11-30
  Administered 2014-11-25 – 2014-11-30 (×11): 2 mg via ORAL
  Filled 2014-11-23 (×12): qty 1

## 2014-11-23 MED ORDER — FLUORESCEIN SODIUM 1 MG OP STRP
1.0000 | ORAL_STRIP | Freq: Once | OPHTHALMIC | Status: AC
  Administered 2014-11-23: 1 via OPHTHALMIC
  Filled 2014-11-23: qty 1

## 2014-11-23 MED ORDER — ISOSORBIDE MONONITRATE ER 60 MG PO TB24
60.0000 mg | ORAL_TABLET | Freq: Every day | ORAL | Status: DC
  Administered 2014-11-24 – 2014-11-29 (×6): 60 mg via ORAL
  Filled 2014-11-23 (×11): qty 1

## 2014-11-23 MED ORDER — DEXTROSE-NACL 5-0.9 % IV SOLN
INTRAVENOUS | Status: DC
  Administered 2014-11-23 – 2014-11-24 (×2): via INTRAVENOUS

## 2014-11-23 MED ORDER — MORPHINE SULFATE (PF) 4 MG/ML IV SOLN
4.0000 mg | Freq: Once | INTRAVENOUS | Status: AC
  Administered 2014-11-23: 4 mg via INTRAVENOUS
  Filled 2014-11-23: qty 1

## 2014-11-23 MED ORDER — ASPIRIN 81 MG PO CHEW
81.0000 mg | CHEWABLE_TABLET | Freq: Every day | ORAL | Status: DC
  Administered 2014-11-23 – 2014-11-30 (×8): 81 mg via ORAL
  Filled 2014-11-23 (×8): qty 1

## 2014-11-23 NOTE — H&P (Signed)
[facility] History and Physical  [name] OZH:[id number] DOB: 1933-05-26 DOA: 11/23/2014  Referring physician: Dr. Wilson Singer PCP: Curly Rim, MD   Chief Complaint: poor oral intake and generalized weakness  HPI: [name] is a 81 y.o. male  Caucasian male with history of paroxysmal atrial fibrillation on amiodarone, hypertension, recent diagnosis of herpes zoster/shingles, COPD, and ischemic cardiomyopathy. Presents to the hospital complaining of poor oral intake and generalized weakness. The poor oral intake has been present for the last 2 weeks reportedly and has gotten progressively worse. He was recently diagnosed with shingles affecting his throat and ear. Was evaluated by the ENT physician who referred patient to the ED for further evaluation and recommendations. Reportedly patient has scope showing shingles outbreak at throat. Despite normal vitals and lab work not consistent with dehydration we have been consulted for medical evaluation and possible admission.  Family reports the patient has been getting weaker and required assistance. He does live at home by himself   Review of Systems:  Constitutional:  No weight loss, night sweats, Fevers, chills, fatigue.  HEENT:  No headaches, + Difficulty swallowing,Tooth/dental problems,+Sore throat,  No sneezing, itching, ear ache, nasal congestion, post nasal drip,  Cardio-vascular:  No chest pain, Orthopnea, PND, swelling in lower extremities, anasarca, dizziness, palpitations  GI:  No heartburn, indigestion, abdominal pain, nausea, vomiting, diarrhea, change in bowel habits, loss of appetite  Resp:  No shortness of breath with exertion or at rest. No excess mucus, no productive cough, No non-productive cough, No coughing up of blood.No change in color of mucus.No wheezing.No chest wall deformity  Skin:  + rash or lesions.  GU:  no dysuria, change in color of urine, no urgency or frequency. No flank pain.    Musculoskeletal:  No joint pain or swelling. No decreased range of motion. No back pain.  Psych:  No change in mood or affect. No depression or anxiety. No memory loss.   Past Medical History  Diagnosis Date  . CAD (coronary artery disease)     a. s/p DES-LCx 2006 b. repeat cath 2011- 90% distal LCx ISR, 100% mid RCA CTO, diffuse nonobstructive dz, medical management c. 07/2012 cath- 100% distal LCx ISR, 100% mid RCA CTO, nonobstructive dz; EF 40-45%  . PVC (premature ventricular contraction)   . HTN (hypertension)   . Paroxysmal a-fib   . PAD (peripheral artery disease)     history of stenting in the past  . COPD (chronic obstructive pulmonary disease)   . LV dysfunction   . Anemia, iron deficiency   . Family history of anesthesia complication     "daughter gets PONV" (11/20/2013)  . Anginal pain   . Pneumonia 1-2 X's  . History of blood transfusion     "he's had several; probably related to his cancer" (11/20/2013)  . GERD (gastroesophageal reflux disease)   . H/O hiatal hernia   . History of gastric ulcer   . Arthritis     "generalized"  . Chronic lower back pain   . Anxiety   . Myocardial infarction 09/2009; ~ 2014    [name] 10/24/2009;   . 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 Lobectomy Left     left upper  . Appendectomy    . Anterior cervical decomp/discectomy fusion    . Twenty four hour intraesophageal ph profile.  05/02/2001  . Aortogram with bilateral lower extremity runoff.  07/11/2003  .  Fiberoptic bronchoscopy, mediastinum.  10/20/2005  . Video assisted thoracoscopy Left 12/01/2005  . Esophagogastroduodenoscopy with biopsy.  04/14/2010  . Iliac artery stent Right 11/20/2013  . Tonsillectomy  1972  . Inguinal hernia repair Bilateral     "I've had several"  . Back surgery      "I've had several back surgeries; got spacers down in lower back" (11/20/2013)  . Cataract extraction w/ intraocular lens   implant, bilateral Bilateral   . Coronary angioplasty with stent placement  2006    DES to LCX/notes 01/04/2014  . Cardiac catheterization  2011; 07/2012    [name] 01/04/2014  . Femoral artery stent Left 01/04/2014  . Left heart catheterization with coronary angiogram N/A 08/19/2012    Procedure: LEFT HEART CATHETERIZATION WITH CORONARY ANGIOGRAM;  Surgeon: Burnell Blanks, MD;  Location: [facility] CATH LAB;  Service: Cardiovascular;  Laterality: N/A;  . Lower extremity angiogram N/A 11/20/2013    Procedure: LOWER EXTREMITY ANGIOGRAM;  Surgeon: Lorretta Harp, MD;  Location: [facility] CATH LAB;  Service: Cardiovascular;  Laterality: N/A;  . Lower extremity angiogram N/A 01/04/2014    Procedure: LOWER EXTREMITY ANGIOGRAM;  Surgeon: Lorretta Harp, MD;  Location: [facility] CATH LAB;  Service: Cardiovascular;  Laterality: N/A;   Social History:  reports that he has been smoking Cigarettes.  He has a 65 pack-year smoking history. He has never used smokeless tobacco. He reports that he drinks alcohol. He reports that he does not use illicit drugs.  Allergies  Allergen Reactions  . Ciprofloxacin Other (See Comments)    Joint pain  . Doxycycline Other (See Comments)    arthralgias  . Atorvastatin Rash  . Sulfa Drugs Cross Reactors Rash  . Zocor [Simvastatin] Rash    Family History  Problem Relation Age of Onset  . Heart Problems Mother   . Breast cancer Mother   . Other Father     Motorcycle Accident  . Breast cancer Sister     Prior to Admission medications   Medication Sig Start Date End Date Taking? Authorizing Provider  acetaminophen (TYLENOL) 325 MG tablet Take 2 tablets (650 mg total) by mouth every 4 (four) hours as needed for headache or mild pain. 01/05/14  Yes Luke K Kilroy, PA-C  albuterol (PROVENTIL HFA;VENTOLIN HFA) 108 (90 BASE) MCG/ACT inhaler Inhale 2 puffs into the lungs every 4 (four) hours as needed for wheezing or shortness of breath.   Yes Historical Provider, MD  amiodarone  (PACERONE) 200 MG tablet TAKE 1 TABLET DAILY 06/25/14  Yes Peter M Martinique, MD  amLODipine (NORVASC) 10 MG tablet Take 0.5 tablets (5 mg total) by mouth daily. 11/29/13  Yes Burtis Junes, NP  aspirin 81 MG chewable tablet Chew 1 tablet (81 mg total) by mouth daily. 01/05/14  Yes Luke K Kilroy, PA-C  carbidopa-levodopa (SINEMET IR) 25-100 MG per tablet Take 0.5-1 tablets by mouth 3 (three) times daily. Patient taking differently: Take 1 tablet by mouth 3 (three) times daily.  01/24/14  Yes Penni Bombard, MD  cephALEXin (KEFLEX) 500 MG capsule Take 500 mg by mouth 4 (four) times daily. 11/21/14  Yes Historical Provider, MD  clopidogrel (PLAVIX) 75 MG tablet Take 1 tablet (75 mg total) by mouth daily. 01/05/14  Yes Luke K Kilroy, PA-C  clotrimazole (MYCELEX) 10 MG troche Take 10 mg by mouth 5 (five) times daily. 11/21/14 12/01/14 Yes Historical Provider, MD  diazepam (VALIUM) 2 MG tablet Take 2 mg by mouth every 8 (eight) hours as needed for muscle  spasms.  12/19/13  Yes Historical Provider, MD  fexofenadine (ALLEGRA) 180 MG tablet Take 180 mg by mouth daily as needed (allergies).    Yes Historical Provider, MD  furosemide (LASIX) 20 MG tablet Take 1 tablet (20 mg total) by mouth daily as needed. Daily as needed for swelling. Take two tablets daily for 14 days then return to one tablet daily 01/12/14  Yes Lorretta Harp, MD  HYDROcodone-acetaminophen (NORCO/VICODIN) 5-325 MG tablet Take 1 tablet by mouth daily as needed. 11/21/14 11/21/15 Yes Historical Provider, MD  isosorbide mononitrate (IMDUR) 60 MG 24 hr tablet Take 1 tablet (60 mg total) by mouth at bedtime. 07/17/14  Yes Peter M Martinique, MD  losartan (COZAAR) 100 MG tablet Take 100 mg by mouth daily.   Yes Historical Provider, MD  nitroGLYCERIN (NITROSTAT) 0.4 MG SL tablet Place 1 tablet (0.4 mg total) under the tongue every 5 (five) minutes as needed for chest pain (Chest pain). 11/29/13  Yes Burtis Junes, NP  pantoprazole (PROTONIX) 40 MG tablet  TAKE 1 TABLET ONCE A DAY 04/16/14  Yes Lorretta Harp, MD  polyethylene glycol [facility] / GLYCOLAX) packet Take 17 g by mouth daily.    Yes Historical Provider, MD  ciprofloxacin-dexamethasone (CIPRODEX) otic suspension Place 4 drops into the right ear 2 (two) times daily. 11/22/14   April Palumbo, MD  ferrous sulfate 325 (65 FE) MG tablet Take 325 mg by mouth 3 (three) times daily with meals. 11/13/13   Modena Jansky, MD   Physical Exam: Filed Vitals:   11/23/14 1548 11/23/14 1657  BP: 141/74 153/64  Pulse: 62 97  Temp: 98.8 F (37.1 C) 98.5 F (36.9 C)  TempSrc: Oral Oral  Resp: 20 18  SpO2: 96% 97%    Wt Readings from Last 3 Encounters:  11/22/14 85.276 kg (188 lb)  07/05/14 87.499 kg (192 lb 14.4 oz)  06/13/14 88.905 kg (196 lb)    General:  Appears calm and comfortable Eyes: PERRL, normal lids, irises & conjunctiva ENT: grossly normal hearing, lips & tongue Neck: no LAD, masses or thyromegaly Cardiovascular: RRR, no m/r/g. No LE edema. Respiratory: CTA bilaterally, no w/r/r. Normal respiratory effort. Abdomen: soft, nt, nd Skin: no rash or induration seen on limited exam Musculoskeletal: grossly normal tone BUE/BLE Psychiatric: grossly normal mood and affect, speech fluent and appropriate Neurologic: hard of hearing but answers questions appropriately, moves extremities equally           Labs on Admission:  Basic Metabolic Panel:  Recent Labs Lab 11/22/14 2235 11/23/14 1618  NA 134* 133*  K 3.5 3.8  CL 98* 99*  CO2 29 27  GLUCOSE 139* 116*  BUN 14 9  CREATININE 0.98 0.91  CALCIUM 10.6* 11.2*   Liver Function Tests:  Recent Labs Lab 11/23/14 1618  AST 36  ALT 11*  ALKPHOS 83  BILITOT 1.0  PROT 6.8  ALBUMIN 3.5   No results for input(s): LIPASE, AMYLASE in the last 168 hours. No results for input(s): AMMONIA in the last 168 hours. CBC:  Recent Labs Lab 11/22/14 2235 11/23/14 1618  WBC 5.8 5.3  HGB 13.4 13.5  HCT 39.8 40.7  MCV 92.1  91.9  PLT 168 184   Cardiac Enzymes: No results for input(s): CKTOTAL, CKMB, CKMBINDEX, TROPONINI in the last 168 hours.  BNP (last 3 results) No results for input(s): BNP in the last 8760 hours.  ProBNP (last 3 results)  Recent Labs  11/29/13 1153  PROBNP 123.0*  CBG: No results for input(s): GLUCAP in the last 168 hours.  Radiological Exams on Admission: Dg Chest 2 View  11/22/2014   CLINICAL DATA:  Cough for 1 month  EXAM: CHEST  2 VIEW  COMPARISON:  November 15, 2013  FINDINGS: The heart size and mediastinal contours are within normal limits. There is left apical pleural thickening and scarring unchanged. Surgical clips are identified in the left hilum unchanged. The visualized skeletal structures are unremarkable.  IMPRESSION: No active cardiopulmonary disease.  Postsurgical changes of the left lung unchanged. Left apical pleural thickening and scarring are unchanged.   Electronically Signed   By: Abelardo Diesel M.D.   On: 11/22/2014 22:17    EKG: Independently reviewed. Sinus bradycardia  Assessment/Plan Principal Problem:   Herpes zoster - Provide supportive therapy to assist with discomfort. - [place] for acyclovir administration, although this is been going on for 2 weeks and will not be as efficient as had been administered earlier  Active Problems:   Hyperglycemia - Obtain hemoglobin A1c - Monitor blood sugars - Diabetic diet. Should blood sugars remain elevated would recommend placing on sliding scale insulin   HTN (hypertension) - Given poor oral intake we'll hold some of patient's antihypertensives.   Paroxysmal a-fib- NSR on Amiodarone - Continue amiodarone. Heart rate has been within normal limits   COPD (chronic obstructive pulmonary disease) - Stable currently. No increased work of breathing - Chest x-ray reports no active cardiopulmonary disease   Decreased oral intake - Consulted nutritionist   Code Status: full DVT  Prophylaxis: heparin Family Communication: d/c patient and family Disposition Plan: med surg  Time spent: > 45 minutes  Velvet Bathe [facility] [employer] [phone]

## 2014-11-23 NOTE — Progress Notes (Signed)
ANTIBIOTIC CONSULT NOTE - INITIAL  Pharmacy Consult for acyclovir Indication: Ramsay Hunt Syndrome (herpes zoster oticus)  Allergies  Allergen Reactions  . Ciprofloxacin Other (See Comments)    Joint pain  . Doxycycline Other (See Comments)    arthralgias  . Atorvastatin Rash  . Sulfa Drugs Cross Reactors Rash  . Zocor [Simvastatin] Rash    Patient Measurements:    Vital Signs: Temp: 98.5 F (36.9 C) (09/30 1657) Temp Source: Oral (09/30 1657) BP: 153/64 mmHg (09/30 1657) Pulse Rate: 97 (09/30 1657) Intake/Output from previous day:   Intake/Output from this shift:    Labs:  Recent Labs  11/22/14 2235 11/23/14 1618  WBC 5.8 5.3  HGB 13.4 13.5  PLT 168 184  CREATININE 0.98 0.91   Estimated Creatinine Clearance: 78.7 mL/min (by C-G formula based on Cr of 0.91). No results for input(s): VANCOTROUGH, VANCOPEAK, VANCORANDOM, GENTTROUGH, GENTPEAK, GENTRANDOM, TOBRATROUGH, TOBRAPEAK, TOBRARND, AMIKACINPEAK, AMIKACINTROU, AMIKACIN in the last 72 hours.   Microbiology: Recent Results (from the past 720 hour(s))  Rapid strep screen     Status: None   Collection Time: 11/22/14  9:26 PM  Result Value Ref Range Status   Streptococcus, Group A Screen (Direct) NEGATIVE NEGATIVE Final    Comment: (NOTE) A Rapid Antigen test may result negative if the antigen level in the sample is below the detection level of this test. The FDA has not cleared this test as a stand-alone test therefore the rapid antigen negative result has reflexed to a Group A Strep culture.     Medical History: Past Medical History  Diagnosis Date  . CAD (coronary artery disease)     a. s/p DES-LCx 2006 b. repeat cath 2011- 90% distal LCx ISR, 100% mid RCA CTO, diffuse nonobstructive dz, medical management c. 07/2012 cath- 100% distal LCx ISR, 100% mid RCA CTO, nonobstructive dz; EF 40-45%  . PVC (premature ventricular contraction)   . HTN (hypertension)   . Paroxysmal a-fib   . PAD (peripheral artery  disease)     history of stenting in the past  . COPD (chronic obstructive pulmonary disease)   . LV dysfunction   . Anemia, iron deficiency   . Family history of anesthesia complication     "daughter gets PONV" (11/20/2013)  . Anginal pain   . Pneumonia 1-2 X's  . History of blood transfusion     "he's had several; probably related to his cancer" (11/20/2013)  . GERD (gastroesophageal reflux disease)   . H/O hiatal hernia   . History of gastric ulcer   . Arthritis     "generalized"  . Chronic lower back pain   . Anxiety   . Myocardial infarction 09/2009; ~ 2014    [name] 10/24/2009;   . Lung cancer dx'd 11/2005    non small cell    Assessment: 81 yom presenting with herpes zoster involving ear/throat. Pharmacy consulted to dose acyclovir for Ramsay Hunt Syndrome (herpes zoster oticus). Started on cipro in the ED yesterday but unable to eat or drink well today, so IV preferred per MD. Afebrile, wbc wnl. SCr 0.91, CrCl~78.  9/30 acyclovir>>  Goal of Therapy:  Resolution of infection  Plan:  Acyclovir '850mg'$  (~10/mg/kg/dose) q8h Mon clinical progress, renal function, LOT  Elicia Lamp, PharmD Clinical Pharmacist Pager [phone] 11/23/2014 5:26 PM

## 2014-11-23 NOTE — ED Notes (Signed)
Pt sent here by ENT doctor with shingles in right ear and throat. Pt unable to eat or drink well. Sent here for admission to the hospital.

## 2014-11-23 NOTE — Progress Notes (Signed)
Patient arrived to 5M18 AAO3. Family at the bedside. Vitals taken and fluids running. Bed alarm on. Monitoring closely. Hocutt, Rande Brunt, RN

## 2014-11-23 NOTE — ED Provider Notes (Signed)
CSN: [id number]     Arrival date & time 11/23/14  1543 History   First MD Initiated Contact with Patient 11/23/14 1636     Chief Complaint  Patient presents with  . Herpes Zoster    The history is provided by the patient, a relative and medical records.   81 year old male with history of CAD status post multiple PCI, Parkinson's, paroxysmal A. fib, peripheral artery disease, COPD, hypertension, lung cancer not currently immunocompromised presenting with right ear and throat. Onset 2 weeks ago. Worsening since then. Located in entire right ear and in throat. Described as soreness and swelling. Now severe. Worse with swallowing or touching here. Not improved by outpatient antibiotics. Associated with decreased by mouth intake due to pain with swallowing, frontal headache and possible vision change in right eye. No fevers. Patient was seen in ENT clinic today, had a scope placed to look at throat, diagnosed with zoster, sent for admission.   Past Medical History  Diagnosis Date  . CAD (coronary artery disease)     a. s/p DES-LCx 2006 b. repeat cath 2011- 90% distal LCx ISR, 100% mid RCA CTO, diffuse nonobstructive dz, medical management c. 07/2012 cath- 100% distal LCx ISR, 100% mid RCA CTO, nonobstructive dz; EF 40-45%  . PVC (premature ventricular contraction)   . HTN (hypertension)   . Paroxysmal a-fib   . PAD (peripheral artery disease)     history of stenting in the past  . COPD (chronic obstructive pulmonary disease)   . LV dysfunction   . Anemia, iron deficiency   . Family history of anesthesia complication     "daughter gets PONV" (11/20/2013)  . Anginal pain   . Pneumonia 1-2 X's  . History of blood transfusion     "he's had several; probably related to his cancer" (11/20/2013)  . GERD (gastroesophageal reflux disease)   . H/O hiatal hernia   . History of gastric ulcer   . Arthritis     "generalized"  . Chronic lower back pain   . Anxiety   . Myocardial infarction 09/2009; ~  2014    [name] 10/24/2009;   . 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 Lobectomy Left     left upper  . Appendectomy    . Anterior cervical decomp/discectomy fusion    . Twenty four hour intraesophageal ph profile.  05/02/2001  . Aortogram with bilateral lower extremity runoff.  07/11/2003  . Fiberoptic bronchoscopy, mediastinum.  10/20/2005  . Video assisted thoracoscopy Left 12/01/2005  . Esophagogastroduodenoscopy with biopsy.  04/14/2010  . Iliac artery stent Right 11/20/2013  . Tonsillectomy  1972  . Inguinal hernia repair Bilateral     "I've had several"  . Back surgery      "I've had several back surgeries; got spacers down in lower back" (11/20/2013)  . Cataract extraction w/ intraocular lens  implant, bilateral Bilateral   . Coronary angioplasty with stent placement  2006    DES to LCX/notes 01/04/2014  . Cardiac catheterization  2011; 07/2012    [name] 01/04/2014  . Femoral artery stent Left 01/04/2014  . Left heart catheterization with coronary angiogram N/A 08/19/2012    Procedure: LEFT HEART CATHETERIZATION WITH CORONARY ANGIOGRAM;  Surgeon: Burnell Blanks, MD;  Location: [facility] CATH LAB;  Service: Cardiovascular;  Laterality: N/A;  . Lower extremity angiogram N/A 11/20/2013    Procedure: LOWER EXTREMITY ANGIOGRAM;  Surgeon: Lorretta Harp, MD;  Location: [facility] CATH LAB;  Service: Cardiovascular;  Laterality: N/A;  . Lower extremity angiogram N/A 01/04/2014    Procedure: LOWER EXTREMITY ANGIOGRAM;  Surgeon: Lorretta Harp, MD;  Location: [facility] CATH LAB;  Service: Cardiovascular;  Laterality: N/A;   Family History  Problem Relation Age of Onset  . Heart Problems Mother   . Breast cancer Mother   . Other Father     Motorcycle Accident  . Breast cancer Sister    Social History  Substance Use Topics  . Smoking status: Current Every Day Smoker -- 1.00 packs/day for 65 years    Types: Cigarettes  . Smokeless  tobacco: Never Used  . Alcohol Use: Yes     Comment: quit 40+ years ago    Review of Systems  Constitutional: Negative for fever.  HENT: Positive for ear pain, sore throat and voice change.   Eyes: Positive for visual disturbance.  Respiratory: Negative for shortness of breath.   Cardiovascular: Negative for chest pain.  Gastrointestinal: Negative for vomiting and abdominal pain.  Genitourinary: Positive for decreased urine volume.  Skin: Positive for rash.  Allergic/Immunologic: Negative for immunocompromised state.  Neurological: Negative for syncope.  Psychiatric/Behavioral: Positive for behavioral problems (Family reports patient has been more sleepy this week and has poor balance, worse than baseline from Parkinson's).      Allergies  Ciprofloxacin; Doxycycline; Atorvastatin; Sulfa drugs cross reactors; and Zocor  Home Medications   Prior to Admission medications   Medication Sig Start Date End Date Taking? Authorizing Provider  acetaminophen (TYLENOL) 325 MG tablet Take 2 tablets (650 mg total) by mouth every 4 (four) hours as needed for headache or mild pain. 01/05/14  Yes Luke K Kilroy, PA-C  albuterol (PROVENTIL HFA;VENTOLIN HFA) 108 (90 BASE) MCG/ACT inhaler Inhale 2 puffs into the lungs every 4 (four) hours as needed for wheezing or shortness of breath.   Yes Historical Provider, MD  amiodarone (PACERONE) 200 MG tablet TAKE 1 TABLET DAILY 06/25/14  Yes Peter M Martinique, MD  amLODipine (NORVASC) 10 MG tablet Take 0.5 tablets (5 mg total) by mouth daily. 11/29/13  Yes Burtis Junes, NP  aspirin 81 MG chewable tablet Chew 1 tablet (81 mg total) by mouth daily. 01/05/14  Yes Luke K Kilroy, PA-C  carbidopa-levodopa (SINEMET IR) 25-100 MG per tablet Take 0.5-1 tablets by mouth 3 (three) times daily. Patient taking differently: Take 1 tablet by mouth 3 (three) times daily.  01/24/14  Yes Penni Bombard, MD  cephALEXin (KEFLEX) 500 MG capsule Take 500 mg by mouth 4 (four) times  daily. 11/21/14  Yes Historical Provider, MD  clopidogrel (PLAVIX) 75 MG tablet Take 1 tablet (75 mg total) by mouth daily. 01/05/14  Yes Luke K Kilroy, PA-C  clotrimazole (MYCELEX) 10 MG troche Take 10 mg by mouth 5 (five) times daily. 11/21/14 12/01/14 Yes Historical Provider, MD  diazepam (VALIUM) 2 MG tablet Take 2 mg by mouth every 8 (eight) hours as needed for muscle spasms.  12/19/13  Yes Historical Provider, MD  fexofenadine (ALLEGRA) 180 MG tablet Take 180 mg by mouth daily as needed (allergies).    Yes Historical Provider, MD  furosemide (LASIX) 20 MG tablet Take 1 tablet (20 mg total) by mouth daily as needed. Daily as needed for swelling. Take two tablets daily for 14 days then return to one tablet daily 01/12/14  Yes Lorretta Harp, MD  HYDROcodone-acetaminophen (NORCO/VICODIN) 5-325 MG tablet Take 1 tablet by mouth daily as needed. 11/21/14 11/21/15 Yes Historical Provider,  MD  isosorbide mononitrate (IMDUR) 60 MG 24 hr tablet Take 1 tablet (60 mg total) by mouth at bedtime. 07/17/14  Yes Peter M Martinique, MD  losartan (COZAAR) 100 MG tablet Take 100 mg by mouth daily.   Yes Historical Provider, MD  nitroGLYCERIN (NITROSTAT) 0.4 MG SL tablet Place 1 tablet (0.4 mg total) under the tongue every 5 (five) minutes as needed for chest pain (Chest pain). 11/29/13  Yes Burtis Junes, NP  pantoprazole (PROTONIX) 40 MG tablet TAKE 1 TABLET ONCE A DAY 04/16/14  Yes Lorretta Harp, MD  polyethylene glycol [employer] / GLYCOLAX) packet Take 17 g by mouth daily.    Yes Historical Provider, MD  ciprofloxacin-dexamethasone (CIPRODEX) otic suspension Place 4 drops into the right ear 2 (two) times daily. 11/22/14   April Palumbo, MD  ferrous sulfate 325 (65 FE) MG tablet Take 325 mg by mouth 3 (three) times daily with meals. 11/13/13   Modena Jansky, MD   BP 135/60 mmHg  Pulse 62  Temp(Src) 98.2 F (36.8 C) (Oral)  Resp 18  Ht '6\' 3"'$  (1.905 m)  Wt 187 lb (84.823 kg)  BMI 23.37 kg/m2  SpO2 95% Physical  Exam  Constitutional: He is oriented to person, place, and time. He appears well-developed and well-nourished. No distress.  HENT:  Right entire ear edematous with clear drainage from external canal, unable to visualize TM, overlying vesicles, severely tender to palpation. Palate with erythema  Eyes: Pupils are equal, round, and reactive to light. Right eye exhibits no discharge. Left eye exhibits no discharge.  Bilateral staining of eyes with fluorescein unremarkable, no vesicles seen  Neck: Normal range of motion. Neck supple. No tracheal deviation present.  Cardiovascular: Normal rate and regular rhythm.   Pulmonary/Chest: Effort normal and breath sounds normal. No respiratory distress.  Abdominal: Soft. He exhibits no distension. There is no tenderness.  Musculoskeletal: He exhibits no edema.  Neurological: He is alert and oriented to person, place, and time. A cranial nerve deficit (facial asymmetry with droop) is present.  Tremors  Skin: Skin is warm and dry.  Psychiatric: He has a normal mood and affect. His behavior is normal.    ED Course  Procedures (including critical care time) Labs Review Labs Reviewed  COMPREHENSIVE METABOLIC PANEL - Abnormal; Notable for the following:    Sodium 133 (*)    Chloride 99 (*)    Glucose, Bld 116 (*)    Calcium 11.2 (*)    ALT 11 (*)    All other components within normal limits  PHOSPHORUS - Abnormal; Notable for the following:    Phosphorus 2.4 (*)    All other components within normal limits  GLUCOSE, CAPILLARY - Abnormal; Notable for the following:    Glucose-Capillary 166 (*)    All other components within normal limits  CBC  MAGNESIUM  BASIC METABOLIC PANEL  CBC  HEMOGLOBIN A1C    Imaging Review Dg Chest 2 View  11/22/2014   CLINICAL DATA:  Cough for 1 month  EXAM: CHEST  2 VIEW  COMPARISON:  November 15, 2013  FINDINGS: The heart size and mediastinal contours are within normal limits. There is left apical pleural thickening  and scarring unchanged. Surgical clips are identified in the left hilum unchanged. The visualized skeletal structures are unremarkable.  IMPRESSION: No active cardiopulmonary disease.  Postsurgical changes of the left lung unchanged. Left apical pleural thickening and scarring are unchanged.   Electronically Signed   By: Mallie Darting.D.  On: 11/22/2014 22:17   I have personally reviewed and evaluated these images and lab results as part of my medical decision-making.   EKG Interpretation None      MDM   Final diagnoses:  Throat pain    81 year old male with history of CAD status post multiple PCI, Parkinson's, paroxysmal A. fib, peripheral artery disease, COPD, hypertension, lung cancer not currently immunocompromised presenting with right ear and throat. Afebrile, vital signs stable. Found to have zoster during the ENT clinic visit today. Given age, poor by mouth intake, questionable safety in home alone with increase in falls with loss of balance, admitted for pain control and further management.  Case discussed with Dr. Wilson Singer, who oversaw management of this patient.  Ivin Booty, MD 11/24/14 7510  Virgel Manifold, MD 11/24/14 [phone]

## 2014-11-24 DIAGNOSIS — K219 Gastro-esophageal reflux disease without esophagitis: Secondary | ICD-10-CM | POA: Diagnosis present

## 2014-11-24 DIAGNOSIS — Z7902 Long term (current) use of antithrombotics/antiplatelets: Secondary | ICD-10-CM | POA: Diagnosis not present

## 2014-11-24 DIAGNOSIS — Z79899 Other long term (current) drug therapy: Secondary | ICD-10-CM | POA: Diagnosis not present

## 2014-11-24 DIAGNOSIS — Z85118 Personal history of other malignant neoplasm of bronchus and lung: Secondary | ICD-10-CM | POA: Diagnosis not present

## 2014-11-24 DIAGNOSIS — B370 Candidal stomatitis: Secondary | ICD-10-CM | POA: Diagnosis present

## 2014-11-24 DIAGNOSIS — R739 Hyperglycemia, unspecified: Secondary | ICD-10-CM | POA: Diagnosis present

## 2014-11-24 DIAGNOSIS — I252 Old myocardial infarction: Secondary | ICD-10-CM | POA: Diagnosis not present

## 2014-11-24 DIAGNOSIS — F419 Anxiety disorder, unspecified: Secondary | ICD-10-CM | POA: Diagnosis present

## 2014-11-24 DIAGNOSIS — E46 Unspecified protein-calorie malnutrition: Secondary | ICD-10-CM | POA: Diagnosis present

## 2014-11-24 DIAGNOSIS — I2510 Atherosclerotic heart disease of native coronary artery without angina pectoris: Secondary | ICD-10-CM | POA: Diagnosis present

## 2014-11-24 DIAGNOSIS — M545 Low back pain: Secondary | ICD-10-CM | POA: Diagnosis present

## 2014-11-24 DIAGNOSIS — G8929 Other chronic pain: Secondary | ICD-10-CM | POA: Diagnosis present

## 2014-11-24 DIAGNOSIS — E876 Hypokalemia: Secondary | ICD-10-CM | POA: Diagnosis not present

## 2014-11-24 DIAGNOSIS — F17210 Nicotine dependence, cigarettes, uncomplicated: Secondary | ICD-10-CM | POA: Diagnosis present

## 2014-11-24 DIAGNOSIS — I10 Essential (primary) hypertension: Secondary | ICD-10-CM | POA: Diagnosis not present

## 2014-11-24 DIAGNOSIS — E43 Unspecified severe protein-calorie malnutrition: Secondary | ICD-10-CM | POA: Diagnosis present

## 2014-11-24 DIAGNOSIS — Z888 Allergy status to other drugs, medicaments and biological substances status: Secondary | ICD-10-CM | POA: Diagnosis not present

## 2014-11-24 DIAGNOSIS — Z981 Arthrodesis status: Secondary | ICD-10-CM | POA: Diagnosis not present

## 2014-11-24 DIAGNOSIS — R070 Pain in throat: Secondary | ICD-10-CM | POA: Diagnosis present

## 2014-11-24 DIAGNOSIS — I739 Peripheral vascular disease, unspecified: Secondary | ICD-10-CM | POA: Diagnosis present

## 2014-11-24 DIAGNOSIS — I480 Paroxysmal atrial fibrillation: Secondary | ICD-10-CM | POA: Diagnosis not present

## 2014-11-24 DIAGNOSIS — J449 Chronic obstructive pulmonary disease, unspecified: Secondary | ICD-10-CM | POA: Diagnosis present

## 2014-11-24 DIAGNOSIS — E871 Hypo-osmolality and hyponatremia: Secondary | ICD-10-CM | POA: Diagnosis present

## 2014-11-24 DIAGNOSIS — B029 Zoster without complications: Secondary | ICD-10-CM | POA: Diagnosis not present

## 2014-11-24 DIAGNOSIS — Z881 Allergy status to other antibiotic agents status: Secondary | ICD-10-CM | POA: Diagnosis not present

## 2014-11-24 DIAGNOSIS — G510 Bell's palsy: Secondary | ICD-10-CM | POA: Diagnosis not present

## 2014-11-24 DIAGNOSIS — I255 Ischemic cardiomyopathy: Secondary | ICD-10-CM | POA: Diagnosis present

## 2014-11-24 DIAGNOSIS — Z955 Presence of coronary angioplasty implant and graft: Secondary | ICD-10-CM | POA: Diagnosis not present

## 2014-11-24 DIAGNOSIS — Z7982 Long term (current) use of aspirin: Secondary | ICD-10-CM | POA: Diagnosis not present

## 2014-11-24 DIAGNOSIS — G20 Parkinson's disease: Secondary | ICD-10-CM | POA: Diagnosis present

## 2014-11-24 DIAGNOSIS — R1310 Dysphagia, unspecified: Secondary | ICD-10-CM | POA: Diagnosis present

## 2014-11-24 LAB — CBC
HEMATOCRIT: 39.1 % (ref 39.0–52.0)
HEMOGLOBIN: 13.2 g/dL (ref 13.0–17.0)
MCH: 30.8 pg (ref 26.0–34.0)
MCHC: 33.8 g/dL (ref 30.0–36.0)
MCV: 91.1 fL (ref 78.0–100.0)
Platelets: 183 10*3/uL (ref 150–400)
RBC: 4.29 MIL/uL (ref 4.22–5.81)
RDW: 14 % (ref 11.5–15.5)
WBC: 3 10*3/uL — AB (ref 4.0–10.5)

## 2014-11-24 LAB — BASIC METABOLIC PANEL
ANION GAP: 5 (ref 5–15)
BUN: 10 mg/dL (ref 6–20)
CHLORIDE: 98 mmol/L — AB (ref 101–111)
CO2: 28 mmol/L (ref 22–32)
Calcium: 10.7 mg/dL — ABNORMAL HIGH (ref 8.9–10.3)
Creatinine, Ser: 0.89 mg/dL (ref 0.61–1.24)
GFR calc Af Amer: >60 mL/min (ref >60)
GFR calc non Af Amer: >60 mL/min (ref >60)
GLUCOSE: 163 mg/dL — AB (ref 65–99)
POTASSIUM: 3.5 mmol/L (ref 3.5–5.1)
SODIUM: 131 mmol/L — AB (ref 135–145)

## 2014-11-24 LAB — HEMOGLOBIN A1C
Hgb A1c MFr Bld: 6 % — ABNORMAL HIGH (ref 4.8–5.6)
Mean Plasma Glucose: 126 mg/dL

## 2014-11-24 LAB — GLUCOSE, CAPILLARY
GLUCOSE-CAPILLARY: 148 mg/dL — AB (ref 65–99)
GLUCOSE-CAPILLARY: 138 mg/dL — AB (ref 65–99)
GLUCOSE-CAPILLARY: 145 mg/dL — AB (ref 65–99)

## 2014-11-24 MED ORDER — ENSURE ENLIVE PO LIQD
237.0000 mL | Freq: Two times a day (BID) | ORAL | Status: DC
  Administered 2014-11-24 – 2014-11-29 (×8): 237 mL via ORAL
  Filled 2014-11-24 (×13): qty 237

## 2014-11-24 MED ORDER — NEOMYCIN-POLYMYXIN-HC 3.5-10000-1 OT SUSP
3.0000 [drp] | Freq: Four times a day (QID) | OTIC | Status: DC
  Administered 2014-11-24 – 2014-11-30 (×24): 3 [drp] via OTIC
  Filled 2014-11-24: qty 10

## 2014-11-24 NOTE — Progress Notes (Signed)
Initial Nutrition Assessment  DOCUMENTATION CODES:  Severe malnutrition in context of acute illness/injury  INTERVENTION:  Ensure Enlive po BID, each supplement provides 350 kcal and 20 grams of protein  NUTRITION DIAGNOSIS:  Inadequate oral intake related to dysphagia, acute illness, poor appetite, painful swallowing as evidenced by energy intake < or equal to 50% for > or equal to 5 days, loss of >2% bw in 1 week.  GOAL:  Patient will meet greater than or equal to 90% of their needs  MONITOR:  PO intake, Supplement acceptance, Labs, I & O's  REASON FOR ASSESSMENT:  Consult Assessment of nutrition requirement/status  ASSESSMENT:  81 year old male PMHX CAD, GERD , peripheral artery disease,  COPD, HTN, lung cancer, recent shingles (ear/throat) presents with poor oral intake x2 weeks  Spoke with pt and family member at bedside. Pt is HOH and was difficult to communicate with.   Apparently pt has been eating very poorly these last 2 weeks. Pt identifies trouble/painful swallowing, poor appetite and weakness ("difficult to sit up") as the causes. He would eat 1 meal a day.This would sometimes just be a can of soup or some fast food. Pt would supplement his poor meal intake with 1 Ensure daily. He did not take any vit/min supplements.   Pt denies n/v/d. He has chronic constipation  Pt was found to have shingles approximately 2 weeks ago and family member reports that his symptoms largely coincide with this.  Pts normal weight is 190-197. Was 188 lbs at a doctors appointment on Wednesday. Family member believes pt has lost ~7 lbs (3.5%) these last 2 weeks. Though no documentation to confirm this, seems feasible given his very poor intake.   NFPE: WDL  Diet Order:  DIET SOFT Room service appropriate?: Yes; Fluid consistency:: Thin  Skin:  Dry  Last BM:  9/30  Height:  Ht Readings from Last 1 Encounters:  11/23/14 '6\' 3"'$  (1.905 m)   Weight:  Wt Readings from Last 1 Encounters:   11/23/14 187 lb (84.823 kg)   Wt Readings from Last 10 Encounters:  11/23/14 187 lb (84.823 kg)  11/22/14 188 lb (85.276 kg)  07/05/14 192 lb 14.4 oz (87.499 kg)  06/13/14 196 lb (88.905 kg)  02/06/14 193 lb (87.544 kg)  01/24/14 191 lb 9.6 oz (86.909 kg)  01/16/14 190 lb 3.2 oz (86.274 kg)  01/12/14 193 lb (87.544 kg)  01/05/14 193 lb (87.544 kg)  01/05/14 192 lb 0.3 oz (87.1 kg)   Ideal Body Weight:  89.1 kg  BMI:  Body mass index is 23.37 kg/(m^2).  Estimated Nutritional Needs:  Kcal:  1950-2100 (23-25 kcal/kg) Protein:  85-102 g (1-1.2 g/kg bw) Fluid:  2 liters  EDUCATION NEEDS:  No education needs identified at this time  Burtis Junes RD, LDN Nutrition Pager: [phone] 11/24/2014 11:01 AM

## 2014-11-24 NOTE — Progress Notes (Signed)
Pt's daughter requested to speak with MD. MD text paged to notify.

## 2014-11-24 NOTE — Progress Notes (Addendum)
[facility] PROGRESS NOTE  HARUTYUN MONTEVERDE UMP:[id number] DOB: [birth year] DOA: 11/23/2014 PCP: Curly Rim, MD  Assessment/Plan: Principal Problem:  Herpes zoster - Provide supportive therapy to assist with discomfort. - Anza - ENT evaluation today. I have reviewed their recommendations. Will consult ID if anti infectives on board do not improve patient's condition. Per ENT this is not Herpes Zoster Oticus - continue supportive therapy  Active Problems:  Hyperglycemia - Obtain hemoglobin A1c - Monitor blood sugars - Diabetic diet. Should blood sugars remain elevated would recommend placing on sliding scale insulin  HTN (hypertension) - Relatively well controlled for age, continue current regimen.  Paroxysmal a-fib- NSR on Amiodarone - Continue amiodarone. Heart rate has been within normal limits  COPD (chronic obstructive pulmonary disease) - Stable currently. No increased work of breathing - Chest x-ray reports no active cardiopulmonary disease  Decreased oral intake - Consulted nutritionist and agree with ensure supplementation.  Code Status: full Family Communication: discussed with patient and family member at bedside.  Disposition Plan:    Consultants:  None  Procedures:  None  Antibiotics:  None   Antiviral - Acyclovir  HPI/Subjective: Pt has no new complaints. Attempting to eat  Objective: Filed Vitals:   11/24/14 1309  BP: 146/58  Pulse: 61  Temp: 98 F (36.7 C)  Resp: 16    Intake/Output Summary (Last 24 hours) at 11/24/14 1534 Last data filed at 11/24/14 1506  Gross per 24 hour  Intake      0 ml  Output    500 ml  Net   -500 ml   Filed Weights   11/23/14 2008  Weight: 84.823 kg (187 lb)    Exam:   General:  Pt in nad, alert and awake  Cardiovascular: rrr, no mrg  Respiratory: cta bl, no wheezes  Abdomen: soft, ND, NT  Musculoskeletal: no cyanosis or clubbing   Data  Reviewed: Basic Metabolic Panel:  Recent Labs Lab 11/22/14 2235 11/23/14 1618 11/23/14 2049 11/24/14 0655  NA 134* 133*  --  131*  K 3.5 3.8  --  3.5  CL 98* 99*  --  98*  CO2 29 27  --  28  GLUCOSE 139* 116*  --  163*  BUN 14 9  --  10  CREATININE 0.98 0.91  --  0.89  CALCIUM 10.6* 11.2*  --  10.7*  MG  --   --  2.0  --   PHOS  --   --  2.4*  --    Liver Function Tests:  Recent Labs Lab 11/23/14 1618  AST 36  ALT 11*  ALKPHOS 83  BILITOT 1.0  PROT 6.8  ALBUMIN 3.5   No results for input(s): LIPASE, AMYLASE in the last 168 hours. No results for input(s): AMMONIA in the last 168 hours. CBC:  Recent Labs Lab 11/22/14 2235 11/23/14 1618 11/24/14 0655  WBC 5.8 5.3 3.0*  HGB 13.4 13.5 13.2  HCT 39.8 40.7 39.1  MCV 92.1 91.9 91.1  PLT 168 184 183   Cardiac Enzymes: No results for input(s): CKTOTAL, CKMB, CKMBINDEX, TROPONINI in the last 168 hours. BNP (last 3 results) No results for input(s): BNP in the last 8760 hours.  ProBNP (last 3 results)  Recent Labs  11/29/13 1153  PROBNP 123.0*    CBG:  Recent Labs Lab 11/23/14 2152 11/24/14 0636 11/24/14 1106  GLUCAP 166* 148* 138*    Recent Results (from the past 240 hour(s))  Rapid strep screen  Status: None   Collection Time: 11/22/14  9:26 PM  Result Value Ref Range Status   Streptococcus, Group A Screen (Direct) NEGATIVE NEGATIVE Final    Comment: (NOTE) A Rapid Antigen test may result negative if the antigen level in the sample is below the detection level of this test. The FDA has not cleared this test as a stand-alone test therefore the rapid antigen negative result has reflexed to a Group A Strep culture.      Studies: Dg Chest 2 View  11/22/2014   CLINICAL DATA:  Cough for 1 month  EXAM: CHEST  2 VIEW  COMPARISON:  November 15, 2013  FINDINGS: The heart size and mediastinal contours are within normal limits. There is left apical pleural thickening and scarring unchanged. Surgical  clips are identified in the left hilum unchanged. The visualized skeletal structures are unremarkable.  IMPRESSION: No active cardiopulmonary disease.  Postsurgical changes of the left lung unchanged. Left apical pleural thickening and scarring are unchanged.   Electronically Signed   By: Abelardo Diesel M.D.   On: 11/22/2014 22:17    Scheduled Meds: . acyclovir  850 mg Intravenous 3 times per day  . amiodarone  200 mg Oral Daily  . aspirin  81 mg Oral Daily  . carbidopa-levodopa  1 tablet Oral TID  . clopidogrel  75 mg Oral Daily  . feeding supplement (ENSURE ENLIVE)  237 mL Oral BID BM  . heparin  5,000 Units Subcutaneous 3 times per day  . isosorbide mononitrate  60 mg Oral QHS  . neomycin-polymyxin-hydrocortisone  3 drop Right Ear 4 times per day  . pantoprazole  40 mg Oral Daily   Continuous Infusions: . dextrose 5 % and 0.9% NaCl 100 mL/hr at 11/24/14 0932    Principal Problem:   Herpes zoster Active Problems:   Hyperglycemia   HTN (hypertension)   Paroxysmal a-fib- NSR on Amiodarone   COPD (chronic obstructive pulmonary disease)   Decreased oral intake   Protein-calorie malnutrition, severe    Time spent: > 35 minutes    Velvet Bathe  [facility] Pager [phone] If 7PM-7AM, please contact night-coverage at [email or web address], password [facility] 11/24/2014, 3:34 PM      Addendum:  Daughter would like for [place] to stop doing cbg checks. I discussed with her that his blood sugars are above normal levels. She states that his hgba1c is below 6.5 and that she would not like for her dad to get insulin as he is not diabetic. I will discontinue blood sugar checks and place on diabetic diet.  Also since patient is eating now will discontinue D5 normal saline administration.

## 2014-11-24 NOTE — Progress Notes (Signed)
11/24/2014 9:57 AM  [name] [id number]  Hospital Day 2    Temp:  [98.1 F (36.7 C)-98.8 F (37.1 C)] 98.1 F (36.7 C) (10/01 0907) Pulse Rate:  [49-97] 62 (10/01 0907) Resp:  [16-20] 18 (10/01 0907) BP: (117-153)/(41-74) 153/69 mmHg (10/01 0907) SpO2:  [94 %-99 %] 94 % (10/01 0907) Weight:  [84.823 kg (187 lb)] 84.823 kg (187 lb) (09/30 2008),    No intake or output data in the 24 hours ending 11/24/14 0957  Results for orders placed or performed during the hospital encounter of 11/23/14 (from the past 24 hour(s))  Comprehensive metabolic panel     Status: Abnormal   Collection Time: 11/23/14  4:18 PM  Result Value Ref Range   Sodium 133 (L) 135 - 145 mmol/L   Potassium 3.8 3.5 - 5.1 mmol/L   Chloride 99 (L) 101 - 111 mmol/L   CO2 27 22 - 32 mmol/L   Glucose, Bld 116 (H) 65 - 99 mg/dL   BUN 9 6 - 20 mg/dL   Creatinine, Ser 0.91 0.61 - 1.24 mg/dL   Calcium 11.2 (H) 8.9 - 10.3 mg/dL   Total Protein 6.8 6.5 - 8.1 g/dL   Albumin 3.5 3.5 - 5.0 g/dL   AST 36 15 - 41 U/L   ALT 11 (L) 17 - 63 U/L   Alkaline Phosphatase 83 38 - 126 U/L   Total Bilirubin 1.0 0.3 - 1.2 mg/dL   GFR calc non Af Amer >60 >60 mL/min   GFR calc Af Amer >60 >60 mL/min   Anion gap 7 5 - 15  CBC     Status: None   Collection Time: 11/23/14  4:18 PM  Result Value Ref Range   WBC 5.3 4.0 - 10.5 K/uL   RBC 4.43 4.22 - 5.81 MIL/uL   Hemoglobin 13.5 13.0 - 17.0 g/dL   HCT 40.7 39.0 - 52.0 %   MCV 91.9 78.0 - 100.0 fL   MCH 30.5 26.0 - 34.0 pg   MCHC 33.2 30.0 - 36.0 g/dL   RDW 14.3 11.5 - 15.5 %   Platelets 184 150 - 400 K/uL  Phosphorus     Status: Abnormal   Collection Time: 11/23/14  8:49 PM  Result Value Ref Range   Phosphorus 2.4 (L) 2.5 - 4.6 mg/dL  Magnesium     Status: None   Collection Time: 11/23/14  8:49 PM  Result Value Ref Range   Magnesium 2.0 1.7 - 2.4 mg/dL  Hemoglobin A1c     Status: Abnormal   Collection Time: 11/23/14  8:49 PM  Result Value Ref Range   Hgb A1c MFr Bld  6.0 (H) 4.8 - 5.6 %   Mean Plasma Glucose 126 mg/dL  Glucose, capillary     Status: Abnormal   Collection Time: 11/23/14  9:52 PM  Result Value Ref Range   Glucose-Capillary 166 (H) 65 - 99 mg/dL   Comment 1 Notify RN    Comment 2 Document in Chart   Glucose, capillary     Status: Abnormal   Collection Time: 11/24/14  6:36 AM  Result Value Ref Range   Glucose-Capillary 148 (H) 65 - 99 mg/dL   Comment 1 Notify RN    Comment 2 Document in Chart   Basic metabolic panel     Status: Abnormal   Collection Time: 11/24/14  6:55 AM  Result Value Ref Range   Sodium 131 (L) 135 - 145 mmol/L   Potassium 3.5 3.5 - 5.1 mmol/L  Chloride 98 (L) 101 - 111 mmol/L   CO2 28 22 - 32 mmol/L   Glucose, Bld 163 (H) 65 - 99 mg/dL   BUN 10 6 - 20 mg/dL   Creatinine, Ser 0.89 0.61 - 1.24 mg/dL   Calcium 10.7 (H) 8.9 - 10.3 mg/dL   GFR calc non Af Amer >60 >60 mL/min   GFR calc Af Amer >60 >60 mL/min   Anion gap 5 5 - 15  CBC     Status: Abnormal   Collection Time: 11/24/14  6:55 AM  Result Value Ref Range   WBC 3.0 (L) 4.0 - 10.5 K/uL   RBC 4.29 4.22 - 5.81 MIL/uL   Hemoglobin 13.2 13.0 - 17.0 g/dL   HCT 39.1 39.0 - 52.0 %   MCV 91.1 78.0 - 100.0 fL   MCH 30.8 26.0 - 34.0 pg   MCHC 33.8 30.0 - 36.0 g/dL   RDW 14.0 11.5 - 15.5 %   Platelets 183 150 - 400 K/uL    SUBJECTIVE:  Throat still very sore.  Swallowing is difficult.  Ear is less tender  OBJECTIVE:  RIGHT pinna still swollen with subtle vesiculations.  Outer rim is relatively spared. Only mildly tender.   Drainage from ear canal.  No facial weakness.  Voice hoarse.    IMPRESSION:  This is NOT Herpes Zoster Oticus, which includes hearing loss and facial paralysis.  I think he has involvement of the vagus nerve with eruptions on his RIGHT ear canal, hemi-soft palate, posterior pharyngeal wall, lateral pharyngeal wall, base of tongue, and supraglottic larynx, all RIGHT sided.  He may have a secondary external otitis and perichondritis of the  pinna.  I do not have an adequate overall explanation for his progressive weakness except on the basis of dehydration and malnutrition.    PLAN:  Consider Infectious disease consultation.  I will ask the nurses to culture his RIGHT ear canal, then begin CiproDex drops and consider anti-pseudomonal/anti staphylococcal IV antibiosis.    [name]

## 2014-11-24 NOTE — Progress Notes (Signed)
Fluid culture collected from right ear per Md order sent to lab for analysis.

## 2014-11-24 NOTE — Evaluation (Signed)
Physical Therapy Evaluation Patient Details Name: [name] MRN: [id number] DOB: [birth year] Today's Date: 11/24/2014   History of Present Illness  Pt is a 81 y/o male with a PMH of paroxysmal a-fib, HTN, shingles, COPD, and ischemic cardiomyopathy. Pt presents with complaints of progressive poor oral intake and general weakness for past 2 weeks. Reportedly pt had a scope showing shingles outbreak at the throat.   Clinical Impression  Pt admitted with above diagnosis. Pt currently with functional limitations due to the deficits listed below (see PT Problem List). At the time of PT eval pt required min assist for mobility and safety with OOB. It is unclear at this time whether pt will have 24 hour assist at d/c, and feel post-acute rehab at the SNF level may be appropriate. Pt will benefit from skilled PT to increase their independence and safety with mobility to allow discharge to the venue listed below.       Follow Up Recommendations SNF;Supervision/Assistance - 24 hour    Equipment Recommendations  Rolling walker with 5" wheels    Recommendations for Other Services       Precautions / Restrictions Precautions Precautions: Fall Restrictions Weight Bearing Restrictions: No      Mobility  Bed Mobility Overal bed mobility: Needs Assistance Bed Mobility: Supine to Sit     Supine to sit: Min assist     General bed mobility comments: Assist to transition fully to EOB. Use of bedrails required.   Transfers Overall transfer level: Needs assistance Equipment used: Rolling walker (2 wheeled) Transfers: Sit to/from Stand Sit to Stand: Min assist         General transfer comment: Poor form when powering up to full stand. Pt requires hands on assist to achieve full standing and maintain balance.   Ambulation/Gait Ambulation/Gait assistance: Min assist Ambulation Distance (Feet): 15 Feet Assistive device: Rolling walker (2 wheeled) Gait Pattern/deviations: Step-through  pattern;Decreased stride length;Staggering left;Staggering right Gait velocity: Decreased Gait velocity interpretation: Below normal speed for age/gender General Gait Details: Pt with gross unsteadiness and poor form with walker. Assist required to maintain standing balance.   Stairs            Wheelchair Mobility    Modified Rankin (Stroke Patients Only)       Balance Overall balance assessment: Needs assistance Sitting-balance support: Feet supported;No upper extremity supported Sitting balance-Leahy Scale: Fair     Standing balance support: Bilateral upper extremity supported;During functional activity Standing balance-Leahy Scale: Poor                               Pertinent Vitals/Pain Pain Assessment: No/denies pain    Home Living Family/patient expects to be discharged to:: Private residence Living Arrangements: Alone Available Help at Discharge: Family;Available PRN/intermittently Type of Home: House Home Access: Stairs to enter Entrance Stairs-Rails: None Entrance Stairs-Number of Steps: Flight through garage which is his normal entry to home. 4 steps to enter through front door.  Home Layout: Two level;Able to live on main level with bedroom/bathroom Home Equipment: Walker - 4 wheels      Prior Function Level of Independence: Independent with assistive device(s)         Comments: Pt reports he uses rollator for mobility - has not been using all the time however since feeling weak has been using more often.      Hand Dominance   Dominant Hand: Right    Extremity/Trunk Assessment  Upper Extremity Assessment: Generalized weakness           Lower Extremity Assessment: Generalized weakness      Cervical / Trunk Assessment: Kyphotic  Communication   Communication: HOH  Cognition Arousal/Alertness: Awake/alert Behavior During Therapy: Flat affect Overall Cognitive Status: No family/caregiver present to determine baseline  cognitive functioning                      General Comments      Exercises        Assessment/Plan    PT Assessment Patient needs continued PT services  PT Diagnosis Difficulty walking;Generalized weakness   PT Problem List Decreased strength;Decreased range of motion;Decreased activity tolerance;Decreased balance;Decreased mobility;Decreased knowledge of use of DME;Decreased safety awareness;Decreased knowledge of precautions;Cardiopulmonary status limiting activity  PT Treatment Interventions DME instruction;Gait training;Stair training;Functional mobility training;Therapeutic activities;Therapeutic exercise;Neuromuscular re-education;Patient/family education   PT Goals (Current goals can be found in the Care Plan section) Acute Rehab PT Goals Patient Stated Goal: Pt did not state goals at this time.  PT Goal Formulation: With patient Time For Goal Achievement: 12/01/14 Potential to Achieve Goals: Good    Frequency Min 3X/week   Barriers to discharge Decreased caregiver support Pt lives alone    Co-evaluation               End of Session Equipment Utilized During Treatment: Gait belt Activity Tolerance: Patient limited by fatigue Patient left: in chair;with call bell/phone within reach;with chair alarm set Nurse Communication: Mobility status    Functional Assessment Tool Used: Clinical judgement Functional Limitation: Mobility: Walking and moving around Mobility: Walking and Moving Around Current Status (Z6109): At least 40 percent but less than 60 percent impaired, limited or restricted Mobility: Walking and Moving Around Goal Status [phone]): At least 40 percent but less than 60 percent impaired, limited or restricted    Time: 1346-1407 PT Time Calculation (min) (ACUTE ONLY): 21 min   Charges:   PT Evaluation $Initial PT Evaluation Tier I: 1 Procedure     PT G Codes:   PT G-Codes **NOT FOR INPATIENT CLASS** Functional Assessment Tool Used:  Clinical judgement Functional Limitation: Mobility: Walking and moving around Mobility: Walking and Moving Around Current Status (U9811): At least 40 percent but less than 60 percent impaired, limited or restricted Mobility: Walking and Moving Around Goal Status [phone]): At least 40 percent but less than 60 percent impaired, limited or restricted    Rolinda Roan 11/24/2014, 2:28 PM   Rolinda Roan, PT, DPT Acute Rehabilitation Services Pager: [phone]

## 2014-11-25 NOTE — Progress Notes (Signed)
[facility] PROGRESS NOTE  [name] [id number]:[id number] DOB: 25-Dec-1933 DOA: 11/23/2014 PCP: Curly Rim, MD  Assessment/Plan: Principal Problem:  Herpes zoster - Provide supportive therapy to assist with discomfort. - [facility] - ENT evaluation today. I have reviewed their recommendations. Will consult ID if anti infectives on board do not improve patient's condition. Per ENT this is not Herpes Zoster Oticus - continue supportive therapy  Active Problems:  Hyperglycemia - hgb a1c reviewed: 6.0 - Patient's daughter requested cessation of CBG checks - Diabetic diet.   HTN (hypertension) - Relatively well controlled for age, continue current regimen.  Paroxysmal a-fib- NSR on Amiodarone - Continue amiodarone. Heart rate has been within normal limits   COPD (chronic obstructive pulmonary disease) - Stable currently. No increased work of breathing - Chest x-ray reports no active cardiopulmonary disease  Decreased oral intake - Consulted nutritionist and agree with ensure supplementation.  Code Status: full Family Communication: discussed with patient and family member at bedside.  Disposition Plan: pending improvement in condition   Consultants:  ENT  Procedures:  None  Antibiotics:  None   Antiviral - Acyclovir  HPI/Subjective: Pt has no new complaints. Attempting to eat  Objective: Filed Vitals:   11/25/14 1748  BP:   Pulse: 60  Temp:   Resp: 16   No intake or output data in the 24 hours ending 11/25/14 1909 Filed Weights   11/23/14 2008  Weight: 84.823 kg (187 lb)    Exam:   General:  Pt in nad, alert and awake  Cardiovascular: rrr, no mrg  Respiratory: cta bl, no wheezes  Abdomen: soft, ND, NT  Musculoskeletal: no cyanosis or clubbing   Data Reviewed: Basic Metabolic Panel:  Recent Labs Lab 11/22/14 2235 11/23/14 1618 11/23/14 2049 11/24/14 0655  NA 134* 133*  --  131*  K 3.5 3.8  --   3.5  CL 98* 99*  --  98*  CO2 29 27  --  28  GLUCOSE 139* 116*  --  163*  BUN 14 9  --  10  CREATININE 0.98 0.91  --  0.89  CALCIUM 10.6* 11.2*  --  10.7*  MG  --   --  2.0  --   PHOS  --   --  2.4*  --    Liver Function Tests:  Recent Labs Lab 11/23/14 1618  AST 36  ALT 11*  ALKPHOS 83  BILITOT 1.0  PROT 6.8  ALBUMIN 3.5   No results for input(s): LIPASE, AMYLASE in the last 168 hours. No results for input(s): AMMONIA in the last 168 hours. CBC:  Recent Labs Lab 11/22/14 2235 11/23/14 1618 11/24/14 0655  WBC 5.8 5.3 3.0*  HGB 13.4 13.5 13.2  HCT 39.8 40.7 39.1  MCV 92.1 91.9 91.1  PLT 168 184 183   Cardiac Enzymes: No results for input(s): CKTOTAL, CKMB, CKMBINDEX, TROPONINI in the last 168 hours. BNP (last 3 results) No results for input(s): BNP in the last 8760 hours.  ProBNP (last 3 results)  Recent Labs  11/29/13 1153  PROBNP 123.0*    CBG:  Recent Labs Lab 11/23/14 2152 11/24/14 0636 11/24/14 1106 11/24/14 1634  GLUCAP 166* 148* 138* 145*    Recent Results (from the past 240 hour(s))  Rapid strep screen     Status: None   Collection Time: 11/22/14  9:26 PM  Result Value Ref Range Status   Streptococcus, Group A Screen (Direct) NEGATIVE NEGATIVE Final    Comment: (NOTE)  A Rapid Antigen test may result negative if the antigen level in the sample is below the detection level of this test. The FDA has not cleared this test as a stand-alone test therefore the rapid antigen negative result has reflexed to a Group A Strep culture.      Studies: No results found.  Scheduled Meds: . acyclovir  850 mg Intravenous 3 times per day  . amiodarone  200 mg Oral Daily  . aspirin  81 mg Oral Daily  . carbidopa-levodopa  1 tablet Oral TID  . clopidogrel  75 mg Oral Daily  . feeding supplement (ENSURE ENLIVE)  237 mL Oral BID BM  . heparin  5,000 Units Subcutaneous 3 times per day  . isosorbide mononitrate  60 mg Oral QHS  .  neomycin-polymyxin-hydrocortisone  3 drop Right Ear 4 times per day  . pantoprazole  40 mg Oral Daily   Continuous Infusions:    Principal Problem:   Herpes zoster Active Problems:   Hyperglycemia   HTN (hypertension)   Paroxysmal a-fib- NSR on Amiodarone   COPD (chronic obstructive pulmonary disease) (HCC)   Decreased oral intake   Protein-calorie malnutrition, severe ([place])   Malnutrition ([place])    Time spent: > 35 minutes    Velvet Bathe  [facility] Pager [phone] If 7PM-7AM, please contact night-coverage at [email or web address], password [facility] 11/25/2014, 7:09 PM  LOS: 1 day

## 2014-11-25 NOTE — Progress Notes (Signed)
11/25/2014 10:34 AM  [name] [id number]  Hopsital Day 3    Temp:  [97.8 F (36.6 C)-98.3 F (36.8 C)] 97.8 F (36.6 C) (10/02 1010) Pulse Rate:  [57-84] 57 (10/02 1010) Resp:  [16-18] 16 (10/02 1010) BP: (120-146)/(58-66) 136/66 mmHg (10/02 1010) SpO2:  [94 %-98 %] 95 % (10/02 1010),     Intake/Output Summary (Last 24 hours) at 11/25/14 1034 Last data filed at 11/24/14 1506  Gross per 24 hour  Intake      0 ml  Output    500 ml  Net   -500 ml    Results for orders placed or performed during the hospital encounter of 11/23/14 (from the past 24 hour(s))  Glucose, capillary     Status: Abnormal   Collection Time: 11/24/14 11:06 AM  Result Value Ref Range   Glucose-Capillary 138 (H) 65 - 99 mg/dL  Glucose, capillary     Status: Abnormal   Collection Time: 11/24/14  4:34 PM  Result Value Ref Range   Glucose-Capillary 145 (H) 65 - 99 mg/dL    SUBJECTIVE:  Still c/o sore throat.    OBJECTIVE:  Less swelling RIGHT pinna.  Some crusting developing.  Possible sl weakness RIGHT corner of mouth.   IMPRESSION:  Satisfactory check  PLAN:  No change in plans.   [name]

## 2014-11-25 NOTE — Progress Notes (Signed)
PT Cancellation Note  Patient Details Name: [name] MRN: [id number] DOB: December 07, 1933   Cancelled Treatment:    Reason Eval/Treat Not Completed: Fatigue/lethargy limiting ability to participate Pt has just returned to bed after being bathed and transferred to/from chair. Family request pt rest at this time. Will follow-up likely tomorrow.  Ellouise Newer 11/25/2014, 3:59 PM Camille Bal [place], [place]

## 2014-11-26 ENCOUNTER — Inpatient Hospital Stay (HOSPITAL_COMMUNITY): Payer: Medicare Other

## 2014-11-26 LAB — CULTURE, GROUP A STREP: Strep A Culture: NEGATIVE

## 2014-11-26 NOTE — Progress Notes (Signed)
ANTIBIOTIC CONSULT NOTE - FOLLOW UP  Pharmacy Consult for Acyclovir Indication: Potential herpes zoster infection.  Allergies  Allergen Reactions  . Ciprofloxacin Other (See Comments)    Joint pain  . Doxycycline Other (See Comments)    arthralgias  . Atorvastatin Rash  . Sulfa Drugs Cross Reactors Rash  . Zocor [Simvastatin] Rash    Patient Measurements: Height: '6\' 3"'$  (190.5 cm) Weight: 187 lb (84.823 kg) IBW/kg (Calculated) : 84.5  Vital Signs: Temp: 97.9 F (36.6 C) (10/03 0539) Temp Source: Oral (10/03 0539) BP: 136/67 mmHg (10/03 0539) Pulse Rate: 55 (10/03 0539) Intake/Output from previous day:   Intake/Output from this shift:    Labs:  Recent Labs  11/23/14 1618 11/24/14 0655  WBC 5.3 3.0*  HGB 13.5 13.2  PLT 184 183  CREATININE 0.91 0.89   Estimated Creatinine Clearance: 80.4 mL/min (by C-G formula based on Cr of 0.89). No results for input(s): VANCOTROUGH, VANCOPEAK, VANCORANDOM, GENTTROUGH, GENTPEAK, GENTRANDOM, TOBRATROUGH, TOBRAPEAK, TOBRARND, AMIKACINPEAK, AMIKACINTROU, AMIKACIN in the last 72 hours.   Microbiology: Recent Results (from the past 720 hour(s))  Rapid strep screen     Status: None   Collection Time: 11/22/14  9:26 PM  Result Value Ref Range Status   Streptococcus, Group A Screen (Direct) NEGATIVE NEGATIVE Final    Comment: (NOTE) A Rapid Antigen test may result negative if the antigen level in the sample is below the detection level of this test. The FDA has not cleared this test as a stand-alone test therefore the rapid antigen negative result has reflexed to a Group A Strep culture.     Anti-infectives    Start     Dose/Rate Route Frequency Ordered Stop   11/23/14 1745  acyclovir (ZOVIRAX) 850 mg in dextrose 5 % 150 mL IVPB     850 mg 167 mL/hr over 60 Minutes Intravenous 3 times per day 11/23/14 1733        Assessment: 81 yo M presenting with herpes zoster involving ear/throat. Pharmacy consulted to dose acyclovir for  Ramsay Hunt Syndrome (herpes zoster oticus). Started on cipro in the ED yesterday but unable to eat or drink well today, so IV preferred per MD. Afebrile, wbc wnl. SCr 0.91, CrCl~78  Infectious Disease: Acyclovir for Ramsay Hunt Syndrome (herpes zoster oticus) but per ENT consult, this is NOT herpes zoster oticus.  ENT suggesting broader abx for staph/PSA coverage and ID consult.  Primary MD aware.  Afebrile. WBC down to 3. Renal stable.  9/30 acyclovir>>  10/1 ear cx>>   Goal of Therapy:  Eradication of infection   Plan:  Acyclovir '850mg'$  (~10/mg/kg/dose) q8h using IBS  Averiana Clouatre S. Alford Highland, PharmD, BCPS Clinical Staff Pharmacist Pager [phone]  Eilene Ghazi Stilling[place] 11/26/2014,8:49 AM

## 2014-11-26 NOTE — Progress Notes (Signed)
Physical Therapy Treatment Patient Details Name: [name] MRN: [id number] DOB: 04/21/1933 Today's Date: 11/26/2014    History of Present Illness Pt is a 81 y/o male with a PMH of paroxysmal a-fib, HTN, shingles, COPD, and ischemic cardiomyopathy. Pt presents with complaints of progressive poor oral intake and general weakness for past 2 weeks. Reportedly pt had a scope showing shingles outbreak at the throat.     PT Comments    Pt with noted R facial droop and significant R lateral lean this date. Pt required maxA for sit to stand transfers and ambulation which was a regression from previous session. Daughter who is a Marine scientist was concerned he had a stroke, discussed this with the RN. RN to contact MD. Pt remains unsafe to d/c home alone at this time and would benefit from ST-SNF upon d/c to achieve maximal functional recovery.  Follow Up Recommendations  SNF;Supervision/Assistance - 24 hour     Equipment Recommendations  Rolling walker with 5" wheels    Recommendations for Other Services       Precautions / Restrictions Precautions Precautions: Fall Restrictions Weight Bearing Restrictions: No    Mobility  Bed Mobility Overal bed mobility: Needs Assistance Bed Mobility: Supine to Sit     Supine to sit: Min assist     General bed mobility comments: pt pulls up on bed rail and daughter, labored effort  Transfers Overall transfer level: Needs assistance Equipment used: Rolling walker (2 wheeled) Transfers: Sit to/from Stand Sit to Stand: Mod assist         General transfer comment: maxA for walker safety, modA to power up, retropulsion/posterior lean  Ambulation/Gait Ambulation/Gait assistance: Max assist Ambulation Distance (Feet): 30 Feet Assistive device: Rolling walker (2 wheeled) Gait Pattern/deviations: Step-through pattern;Decreased stride length;Shuffle;Staggering right Gait velocity: Decreased   General Gait Details: pt unable to maintain bilat  terminal knee extension. pt with strong R lateral lean requiring maxA to keep pelvis at midline. pt with poor walker safety also requiring maxA. pt fatigues quickly   Stairs            Wheelchair Mobility    Modified Rankin (Stroke Patients Only)       Balance Overall balance assessment: Needs assistance Sitting-balance support: Feet supported;Single extremity supported Sitting balance-Leahy Scale: Poor Sitting balance - Comments: R lateral lean, max v/c's to self correct to midline   Standing balance support: Bilateral upper extremity supported Standing balance-Leahy Scale: Poor Standing balance comment: unable to stand without external support                    Cognition Arousal/Alertness: Awake/alert (but reports being sleepy from valium at 930) Behavior During Therapy: Flat affect Overall Cognitive Status: Impaired/Different from baseline Area of Impairment: Safety/judgement         Safety/Judgement: Decreased awareness of safety;Decreased awareness of deficits          Exercises      General Comments General comments (skin integrity, edema, etc.): pt required maxA for donning/doffing of underwear due to being soiled. pt then stood with maxA x 1 min to urinate       Pertinent Vitals/Pain Pain Assessment: No/denies pain    Home Living                      Prior Function            PT Goals (current goals can now be found in the care plan section) Acute Rehab PT  Goals Patient Stated Goal: didn't state Progress towards PT goals: Progressing toward goals    Frequency  Min 3X/week    PT Plan Current plan remains appropriate    Co-evaluation             End of Session Equipment Utilized During Treatment: Gait belt Activity Tolerance: Patient limited by fatigue Patient left: in chair;with call bell/phone within reach;with chair alarm set;with family/visitor present     Time: 0141-0301 PT Time Calculation (min) (ACUTE  ONLY): 38 min  Charges:  $Gait Training: 8-22 mins $Therapeutic Activity: 23-37 mins                    G Codes:      Kingsley Callander 11/26/2014, 1:36 PM   Kittie Plater, PT, DPT Pager #: [phone] Office #: [phone]

## 2014-11-26 NOTE — Progress Notes (Addendum)
[facility] PROGRESS NOTE  MALIKE FOGLIO EVO:[id number] DOB: [birth year] DOA: 11/23/2014 PCP: Curly Rim, MD  Assessment/Plan: Principal Problem:  Herpes zoster - Provide supportive therapy to assist with discomfort. - [place] - ENT evaluation today. I have reviewed their recommendations. Will consult ID if anti infectives on board do not improve patient's condition. Per ENT this is not Herpes Zoster Oticus - continue supportive therapy - Oral intake reportedly still poor. Will place order for strict I and O's for better assessment.  Addendum: Right facial droop - As per my discussion with family at mention that this is most likely related to his herpes zoster infection. But given concerns of weakness with ambulation decided to obtain CT scan without contrast to assess for stroke. CT scan reports no acute intracranial abnormalities.  Active Problems:  Hyperglycemia - hgb a1c reviewed: 6.0 - Patient's daughter requested cessation of CBG checks - Diabetic diet.   HTN (hypertension) - Relatively well controlled for age, continue current regimen.  Paroxysmal a-fib- NSR on Amiodarone - Continue amiodarone. Heart rate has been within normal limits   COPD (chronic obstructive pulmonary disease) - Stable currently. No increased work of breathing - Chest x-ray reports no active cardiopulmonary disease  Decreased oral intake - Consulted nutritionist and agree with ensure supplementation.  Code Status: full Family Communication: discussed with patient and family member at bedside.  Disposition Plan: pending improvement in condition   Consultants:  ENT  Procedures:  None  Antibiotics:  None   Antiviral - Acyclovir  HPI/Subjective: Pt has no new complaints. Attempting to eat  Objective: Filed Vitals:   11/26/14 1351  BP: 140/63  Pulse: 62  Temp: 98.5 F (36.9 C)  Resp: 18   No intake or output data in the 24 hours  ending 11/26/14 1652 Filed Weights   11/23/14 2008  Weight: 84.823 kg (187 lb)    Exam:   General:  Pt in nad, alert and awake  Cardiovascular: rrr, no mrg  Respiratory: cta bl, no wheezes  Abdomen: soft, ND, NT  Musculoskeletal: no cyanosis or clubbing   Data Reviewed: Basic Metabolic Panel:  Recent Labs Lab 11/22/14 2235 11/23/14 1618 11/23/14 2049 11/24/14 0655  NA 134* 133*  --  131*  K 3.5 3.8  --  3.5  CL 98* 99*  --  98*  CO2 29 27  --  28  GLUCOSE 139* 116*  --  163*  BUN 14 9  --  10  CREATININE 0.98 0.91  --  0.89  CALCIUM 10.6* 11.2*  --  10.7*  MG  --   --  2.0  --   PHOS  --   --  2.4*  --    Liver Function Tests:  Recent Labs Lab 11/23/14 1618  AST 36  ALT 11*  ALKPHOS 83  BILITOT 1.0  PROT 6.8  ALBUMIN 3.5   No results for input(s): LIPASE, AMYLASE in the last 168 hours. No results for input(s): AMMONIA in the last 168 hours. CBC:  Recent Labs Lab 11/22/14 2235 11/23/14 1618 11/24/14 0655  WBC 5.8 5.3 3.0*  HGB 13.4 13.5 13.2  HCT 39.8 40.7 39.1  MCV 92.1 91.9 91.1  PLT 168 184 183   Cardiac Enzymes: No results for input(s): CKTOTAL, CKMB, CKMBINDEX, TROPONINI in the last 168 hours. BNP (last 3 results) No results for input(s): BNP in the last 8760 hours.  ProBNP (last 3 results)  Recent Labs  11/29/13 1153  PROBNP 123.0*  CBG:  Recent Labs Lab 11/23/14 2152 11/24/14 0636 11/24/14 1106 11/24/14 1634  GLUCAP 166* 148* 138* 145*    Recent Results (from the past 240 hour(s))  Rapid strep screen     Status: None   Collection Time: 11/22/14  9:26 PM  Result Value Ref Range Status   Streptococcus, Group A Screen (Direct) NEGATIVE NEGATIVE Final    Comment: (NOTE) A Rapid Antigen test may result negative if the antigen level in the sample is below the detection level of this test. The FDA has not cleared this test as a stand-alone test therefore the rapid antigen negative result has reflexed to a Group A  Strep culture.   Culture, Group A Strep     Status: None   Collection Time: 11/22/14  9:26 PM  Result Value Ref Range Status   Strep A Culture Negative  Final    Comment: (NOTE) Performed At: [facility] [place], [place] [id number] Lindon Romp MD OE:[id number]   Ear culture     Status: None (Preliminary result)   Collection Time: 11/24/14  1:41 PM  Result Value Ref Range Status   Specimen Description EAR  Final   Special Requests RIGHT EAR FLUID ON SWAB  Final   Culture   Final    NO GROWTH 1 DAY Performed at [facility]    Report Status PENDING  Incomplete     Studies: Ct Head Wo Contrast  11/26/2014   CLINICAL DATA:  RIGHT facial and mouth droop, leaning to the RIGHT, generalized body weakness for 3 weeks  EXAM: CT HEAD WITHOUT CONTRAST  TECHNIQUE: Contiguous axial images were obtained from the base of the skull through the vertex without intravenous contrast.  COMPARISON:  11/10/2013  FINDINGS: Generalized atrophy.  Normal ventricular morphology.  No midline shift or mass effect.  Small vessel chronic ischemic changes of deep cerebral white matter.  No intracranial hemorrhage, mass lesion or evidence acute infarction.  No extra-axial fluid collections.  Visualized paranasal sinuses and mastoid air cells clear.  No acute osseous findings.  Atherosclerotic calcifications at the carotid siphons.  IMPRESSION: Atrophy with small vessel chronic ischemic changes of deep cerebral white matter.  No acute intracranial abnormalities.   Electronically Signed   By: Lavonia Dana M.D.   On: 11/26/2014 15:17    Scheduled Meds: . acyclovir  850 mg Intravenous 3 times per day  . amiodarone  200 mg Oral Daily  . aspirin  81 mg Oral Daily  . carbidopa-levodopa  1 tablet Oral TID  . clopidogrel  75 mg Oral Daily  . feeding supplement (ENSURE ENLIVE)  237 mL Oral BID BM  . heparin  5,000 Units Subcutaneous 3 times per day  . isosorbide mononitrate  60 mg Oral QHS   . neomycin-polymyxin-hydrocortisone  3 drop Right Ear 4 times per day  . pantoprazole  40 mg Oral Daily   Continuous Infusions:    Principal Problem:   Herpes zoster Active Problems:   Hyperglycemia   HTN (hypertension)   Paroxysmal a-fib- NSR on Amiodarone   COPD (chronic obstructive pulmonary disease) (HCC)   Decreased oral intake   Protein-calorie malnutrition, severe ([place])   Malnutrition ([place])    Time spent: > 35 minutes    Velvet Bathe  [facility] Pager [phone] If 7PM-7AM, please contact night-coverage at [email or web address], password [facility] 11/26/2014, 4:52 PM  LOS: 2 days

## 2014-11-26 NOTE — Progress Notes (Signed)
11/26/2014 8:56 AM  [name] [id number]  Hospital day 4   Temp:  [97.4 F (36.3 C)-97.9 F (36.6 C)] 97.9 F (36.6 C) (10/03 0539) Pulse Rate:  [55-63] 55 (10/03 0539) Resp:  [16-17] 17 (10/03 0539) BP: (133-143)/(61-68) 136/67 mmHg (10/03 0539) SpO2:  [92 %-99 %] 95 % (10/03 0539),    No intake or output data in the 24 hours ending 11/26/14 0856  No results found for this or any previous visit (from the past 24 hour(s)).  SUBJECTIVE:  Still sore throat  OBJECTIVE:  RIGHT corner of mouth weak, but no obvious facial paralysis.  Oral cavity cleaner and more moist.  RIGHT pinna drying and less swollen.  IMPRESSION:  Satisfactory check  PLAN:  No change in plans. Ear culture pending.  [name]

## 2014-11-27 LAB — EAR CULTURE: Culture: NO GROWTH

## 2014-11-27 MED ORDER — DEXTROSE-NACL 5-0.9 % IV SOLN
INTRAVENOUS | Status: DC
  Administered 2014-11-27: 17:00:00 via INTRAVENOUS

## 2014-11-27 NOTE — Care Management Important Message (Signed)
Important Message  Patient Details  Name: [name] MRN: [id number] Date of Birth: October 21, 1933   Medicare Important Message Given:  Yes-second notification given    Delorse Lek 11/27/2014, 11:32 AM

## 2014-11-27 NOTE — Care Management Note (Signed)
Case Management Note  Patient Details  Name: [name] MRN: [id number] Date of Birth: [birth year]  Subjective/Objective:                    Action/Plan: Patient admitted with Herpes Zoster of the throat. Pt lives at home alone. PT recommending SNF. CM will continue to follow for discharge needs.   Expected Discharge Date:                  Expected Discharge Plan:  Skilled Nursing Facility  In-House Referral:  Clinical Social Work  Discharge planning Services     Post Acute Care Choice:    Choice offered to:     DME Arranged:    DME Agency:     HH Arranged:    [facility] Agency:     Status of Service:  In process, will continue to follow  Medicare Important Message Given:  Yes-second notification given Date Medicare IM Given:    Medicare IM give by:    Date Additional Medicare IM Given:    Additional Medicare Important Message give by:     If discussed at [place] of Stay Meetings, dates discussed:    Additional Comments:  Pollie Friar, RN 11/27/2014, 2:07 PM

## 2014-11-27 NOTE — Clinical Social Work Placement (Signed)
   CLINICAL SOCIAL WORK PLACEMENT  NOTE  Date:  11/27/2014  Patient Details  Name: [name] MRN: [id number] Date of Birth: 1934-02-01  Clinical Social Work is seeking post-discharge placement for this patient at the [place] level of care (*CSW will initial, date and re-position this form in  chart as items are completed):  Yes   Patient/family provided with [facility] Work Department's list of facilities offering this level of care within the geographic area requested by the patient (or if unable, by the patient's family).  Yes   Patient/family informed of their freedom to choose among providers that offer the needed level of care, that participate in Medicare, Medicaid or managed care program needed by the patient, have an available bed and are willing to accept the patient.  Yes   Patient/family informed of [facility]'s ownership interest in [facility] and [facility], as well as of the fact that they are under no obligation to receive care at these facilities.  PASRR submitted to EDS on 11/27/14     PASRR number received on 11/27/14     Existing PASRR number confirmed on       FL2 transmitted to all facilities in geographic area requested by pt/family on 11/27/14     FL2 transmitted to all facilities within larger geographic area on       Patient informed that his/her managed care company has contracts with or will negotiate with certain facilities, including the following:            Patient/family informed of bed offers received.  Patient chooses bed at       Physician recommends and patient chooses bed at      Patient to be transferred to   on  .  Patient to be transferred to facility by       Patient family notified on   of transfer.  Name of family member notified:        PHYSICIAN Please sign FL2     Additional Comment:    _______________________________________________ Glendon Axe, MSW, LCSWA [phone] 11/27/2014 10:50 AM

## 2014-11-27 NOTE — Clinical Social Work Note (Signed)
Clinical Social Work Assessment  Patient Details  Name: [name] MRN: [id number] Date of Birth: [birth year]  Date of referral:  11/27/14               Reason for consult:  Discharge Planning, Facility Placement                Permission sought to share information with:  Case Manager, Customer service manager, Family Supports Permission granted to share information::  Yes, Verbal Permission Granted  Name::      [name] )  Agency::   (SNF's )  Relationship::   (Daughter )  Contact Information:   [phone])  Housing/Transportation Living arrangements for the past 2 months:  Single Family Home Source of Information:  Adult Children Patient Interpreter Needed:  None Criminal Activity/Legal Involvement Pertinent to Current Situation/Hospitalization:  No - Comment as needed Significant Relationships:  Adult Children Lives with:  Self Do you feel safe going back to the place where you live?  Yes Need for family participation in patient care:  Yes (Comment)  Care giving concerns:  Patient currently requiring short-term rehab and 24 hour supervision/assistance.   Social Worker assessment / plan:  Holiday representative met with patient in reference to post-acute placement for SNF. CSW introduced CSW role. Patient politely suggested that CSW contact his daughter, [name] in regards to discharge planning. CSW contacted pt's dtr to present PT recommendations for SNF. CSW introduced CSW role and SNF process. Pt's dtr stated she and family were aware of recommendation however has not decided on SNF at this time. Pt's dtr confirmed that patient is from home alone however she and her brother, [name] live next door. Pt's dtr reported that pt will have assistance if he returns home with family. Pt's dtr plans to meet with patient today to assess his thoughts on options for discharge planning. Pt's dtr also stated that she wants to speak with her brother, [name] to further discuss plans to  discharge. Pt's dtr agreeable to SNF search and prefers [facility]. No further concerns reported at this time. BSW Intern to complete FL-2 and place on chart for MD signature. CSW will continue to follow pt and pt's family for continued support and to facilitate pt's discharge needs once medically stable if family chooses SNF placement.   Employment status:  Retired Nurse, adult PT Recommendations:  [place] / Referral to community resources:  [place]  Patient/Family's Response to care:  Pt alert and oriented. Pt advised and gave CSW permission to contact dtr, [name]. Pt's dtr agreeable to SNF search however will further discuss plan with family. Pt's dtr prefers [facility]. Pt's family involved and supportive of pt's care. Pt's dtr pleasant and appreciated social work intervention.  Patient/Family's Understanding of and Emotional Response to Diagnosis, Current Treatment, and Prognosis:  Pt's dtr knowledgeable of diagnosis and medical intervention. Pt's dtr expressed understanding of the process of discharge planning for SNF. CSW remains available as needed.   Emotional Assessment Appearance:  Appears stated age Attitude/Demeanor/Rapport:  Unable to Assess Affect (typically observed):  Appropriate Orientation:  Oriented to Self, Oriented to Place, Oriented to  Time, Oriented to Situation Alcohol / Substance use:  Not Applicable Psych involvement (Current and /or in the community):  No (Comment)  Discharge Needs  Concerns to be addressed:  Care Coordination Readmission within the last 30 days:  No Current discharge risk:  Lives alone, Dependent with Mobility Barriers to  Discharge:  Continued Medical Work up   [employer], MSW, [place] [phone] 11/27/2014 10:35 AM

## 2014-11-27 NOTE — Progress Notes (Signed)
Physical Therapy Treatment Patient Details Name: [name] MRN: [id number] DOB: May 23, 1933 Today's Date: 11/27/2014    History of Present Illness Pt is a 81 y/o male with a PMH of paroxysmal a-fib, HTN, shingles, COPD, and ischemic cardiomyopathy. Pt presents with complaints of progressive poor oral intake and general weakness for past 2 weeks. Reportedly pt had a scope showing shingles outbreak at the throat.     PT Comments    Pt with improved transfer ability and ambulation tolerance this date. Pt con't to lean to the R, has slurred speech and R facial droop. Pt remains unsafe to return home alone and con't to benefit from SNF upon d/c to maximize functional recovery.  Follow Up Recommendations  SNF;Supervision/Assistance - 24 hour     Equipment Recommendations  Rolling walker with 5" wheels    Recommendations for Other Services OT consult     Precautions / Restrictions Precautions Precautions: Fall Restrictions Weight Bearing Restrictions: No    Mobility  Bed Mobility Overal bed mobility: Needs Assistance Bed Mobility: Supine to Sit     Supine to sit: Min assist     General bed mobility comments: pt pulls up on bed rail and daughter, labored effort  Transfers Overall transfer level: Needs assistance Equipment used: Rolling walker (2 wheeled) Transfers: Sit to/from Stand Sit to Stand: Min assist         General transfer comment: minA to steady pt, max v/c's for safe hand placement and not to pull up on walker, pt leans back on bed  Ambulation/Gait Ambulation/Gait assistance: Mod assist Ambulation Distance (Feet): 50 Feet (x2) Assistive device: Rolling walker (2 wheeled) Gait Pattern/deviations: Step-through pattern;Decreased stride length;Trunk flexed Gait velocity: decresaed   General Gait Details: max encouragement required, attempted multiple times to have pt look forward instead of down however pt unable. Pt with increased difficulty with clearing  R LE as fatigue sets in, max v/c's for safe walker management   Stairs            Wheelchair Mobility    Modified Rankin (Stroke Patients Only)       Balance Overall balance assessment: Needs assistance Sitting-balance support: Feet supported;Single extremity supported Sitting balance-Leahy Scale: Fair     Standing balance support: Bilateral upper extremity supported Standing balance-Leahy Scale: Poor                      Cognition Arousal/Alertness: Awake/alert Behavior During Therapy: Flat affect Overall Cognitive Status: Impaired/Different from baseline Area of Impairment: Safety/judgement         Safety/Judgement: Decreased awareness of safety;Decreased awareness of deficits          Exercises      General Comments        Pertinent Vitals/Pain Pain Assessment: No/denies pain    Home Living                      Prior Function            PT Goals (current goals can now be found in the care plan section) Progress towards PT goals: Progressing toward goals    Frequency  Min 3X/week    PT Plan Current plan remains appropriate    Co-evaluation             End of Session Equipment Utilized During Treatment: Gait belt Activity Tolerance: Patient limited by fatigue Patient left: in chair;with call bell/phone within reach;with chair alarm set;with family/visitor present  Time: 1130-1155 PT Time Calculation (min) (ACUTE ONLY): 25 min  Charges:  $Gait Training: 23-37 mins                    G Codes:      Kingsley Callander 11/27/2014, 12:52 PM  Kittie Plater, PT, DPT Pager #: [phone] Office #: [phone]

## 2014-11-27 NOTE — Progress Notes (Signed)
[facility] PROGRESS NOTE  AHREN PETTINGER EXH:[id number] DOB: April 25, 1933 DOA: 11/23/2014 PCP: Curly Rim, MD  Brief narrative: Patient is a 81 year old presented with shingles affecting his throat and right ear. ENT specialist on board. Prolonged hospital course has been secondary to poor oral intake as a result of discomfort associated with shingles. He is currently on acyclovir  Assessment/Plan: Principal Problem:  Herpes zoster - Provide supportive therapy to assist with discomfort. - [place] - ENT evaluation today. I have reviewed their recommendations. Will consult ID if anti infectives on board do not improve patient's condition. Per ENT this is not Herpes Zoster Oticus - continue supportive therapy - Oral intake reportedly still poor. Will place order for strict I and O's for better assessment. Given continued poor oral intake we'll place on D5 normal saline  Right facial droop - As per my discussion with family at mention that this is most likely related to his herpes zoster infection. But given concerns of weakness with ambulation.  Obtained CT scan without contrast to assess for stroke. CT scan reports no acute intracranial abnormalities.  Active Problems:  Hyperglycemia - hgb a1c reviewed: 6.0 - Patient's daughter requested cessation of CBG checks - Diabetic diet.   HTN (hypertension) - Relatively well controlled for age, continue current regimen.  Paroxysmal a-fib- NSR on Amiodarone - Continue amiodarone. Heart rate has been within normal limits   COPD (chronic obstructive pulmonary disease) - Stable currently. No increased work of breathing - Chest x-ray reports no active cardiopulmonary disease  Decreased oral intake - Consulted nutritionist and agree with ensure supplementation.  Code Status: full Family Communication: discussed with patient and family member at bedside.  Disposition Plan: pending improvement in oral  intake. May consider d/c in the next 1-2 days with improvement in oral intake.   Consultants:  ENT  Procedures:  None  Antibiotics:  None   Antiviral - Acyclovir  HPI/Subjective: Pt has no new complaints. Still poor oral intake per nursing, daughter, and patient.  Objective: Filed Vitals:   11/27/14 1326  BP: 144/60  Pulse: 62  Temp: 98.4 F (36.9 C)  Resp: 20    Intake/Output Summary (Last 24 hours) at 11/27/14 1612 Last data filed at 11/27/14 1228  Gross per 24 hour  Intake    480 ml  Output    550 ml  Net    -70 ml   Filed Weights   11/23/14 2008  Weight: 84.823 kg (187 lb)    Exam:   General:  Pt in nad, alert and awake  Cardiovascular: rrr, no mrg  Respiratory: cta bl, no wheezes  Abdomen: soft, ND, NT  Musculoskeletal: no cyanosis or clubbing   Data Reviewed: Basic Metabolic Panel:  Recent Labs Lab 11/22/14 2235 11/23/14 1618 11/23/14 2049 11/24/14 0655  NA 134* 133*  --  131*  K 3.5 3.8  --  3.5  CL 98* 99*  --  98*  CO2 29 27  --  28  GLUCOSE 139* 116*  --  163*  BUN 14 9  --  10  CREATININE 0.98 0.91  --  0.89  CALCIUM 10.6* 11.2*  --  10.7*  MG  --   --  2.0  --   PHOS  --   --  2.4*  --    Liver Function Tests:  Recent Labs Lab 11/23/14 1618  AST 36  ALT 11*  ALKPHOS 83  BILITOT 1.0  PROT 6.8  ALBUMIN 3.5  No results for input(s): LIPASE, AMYLASE in the last 168 hours. No results for input(s): AMMONIA in the last 168 hours. CBC:  Recent Labs Lab 11/22/14 2235 11/23/14 1618 11/24/14 0655  WBC 5.8 5.3 3.0*  HGB 13.4 13.5 13.2  HCT 39.8 40.7 39.1  MCV 92.1 91.9 91.1  PLT 168 184 183   Cardiac Enzymes: No results for input(s): CKTOTAL, CKMB, CKMBINDEX, TROPONINI in the last 168 hours. BNP (last 3 results) No results for input(s): BNP in the last 8760 hours.  ProBNP (last 3 results)  Recent Labs  11/29/13 1153  PROBNP 123.0*    CBG:  Recent Labs Lab 11/23/14 2152 11/24/14 0636  11/24/14 1106 11/24/14 1634  GLUCAP 166* 148* 138* 145*    Recent Results (from the past 240 hour(s))  Rapid strep screen     Status: None   Collection Time: 11/22/14  9:26 PM  Result Value Ref Range Status   Streptococcus, Group A Screen (Direct) NEGATIVE NEGATIVE Final    Comment: (NOTE) A Rapid Antigen test may result negative if the antigen level in the sample is below the detection level of this test. The FDA has not cleared this test as a stand-alone test therefore the rapid antigen negative result has reflexed to a Group A Strep culture.   Culture, Group A Strep     Status: None   Collection Time: 11/22/14  9:26 PM  Result Value Ref Range Status   Strep A Culture Negative  Final    Comment: (NOTE) Performed At: [facility] [place], [place] [id number] Lindon Romp MD NW:[id number]   Ear culture     Status: None   Collection Time: 11/24/14  1:41 PM  Result Value Ref Range Status   Specimen Description EAR  Final   Special Requests RIGHT EAR FLUID ON SWAB  Final   Culture   Final    NO GROWTH 2 DAYS Performed at [facility]    Report Status 11/27/2014 FINAL  Final     Studies: Ct Head Wo Contrast  11/26/2014   CLINICAL DATA:  RIGHT facial and mouth droop, leaning to the RIGHT, generalized body weakness for 3 weeks  EXAM: CT HEAD WITHOUT CONTRAST  TECHNIQUE: Contiguous axial images were obtained from the base of the skull through the vertex without intravenous contrast.  COMPARISON:  11/10/2013  FINDINGS: Generalized atrophy.  Normal ventricular morphology.  No midline shift or mass effect.  Small vessel chronic ischemic changes of deep cerebral white matter.  No intracranial hemorrhage, mass lesion or evidence acute infarction.  No extra-axial fluid collections.  Visualized paranasal sinuses and mastoid air cells clear.  No acute osseous findings.  Atherosclerotic calcifications at the carotid siphons.  IMPRESSION: Atrophy with small  vessel chronic ischemic changes of deep cerebral white matter.  No acute intracranial abnormalities.   Electronically Signed   By: Lavonia Dana M.D.   On: 11/26/2014 15:17    Scheduled Meds: . acyclovir  850 mg Intravenous 3 times per day  . amiodarone  200 mg Oral Daily  . aspirin  81 mg Oral Daily  . carbidopa-levodopa  1 tablet Oral TID  . clopidogrel  75 mg Oral Daily  . feeding supplement (ENSURE ENLIVE)  237 mL Oral BID BM  . heparin  5,000 Units Subcutaneous 3 times per day  . isosorbide mononitrate  60 mg Oral QHS  . neomycin-polymyxin-hydrocortisone  3 drop Right Ear 4 times per day  . pantoprazole  40 mg  Oral Daily   Continuous Infusions:    Principal Problem:   Herpes zoster Active Problems:   Hyperglycemia   HTN (hypertension)   Paroxysmal a-fib- NSR on Amiodarone   COPD (chronic obstructive pulmonary disease) (HCC)   Decreased oral intake   Protein-calorie malnutrition, severe ([place])   Malnutrition ([place])    Time spent: > 35 minutes    Velvet Bathe  [facility] Pager [phone] If 7PM-7AM, please contact night-coverage at [email or web address], password [facility] 11/27/2014, 4:12 PM  LOS: 3 days

## 2014-11-28 ENCOUNTER — Inpatient Hospital Stay (HOSPITAL_COMMUNITY): Payer: Medicare Other

## 2014-11-28 DIAGNOSIS — B029 Zoster without complications: Principal | ICD-10-CM

## 2014-11-28 DIAGNOSIS — G510 Bell's palsy: Secondary | ICD-10-CM

## 2014-11-28 DIAGNOSIS — I480 Paroxysmal atrial fibrillation: Secondary | ICD-10-CM

## 2014-11-28 DIAGNOSIS — I10 Essential (primary) hypertension: Secondary | ICD-10-CM

## 2014-11-28 MED ORDER — PREDNISONE 20 MG PO TABS
80.0000 mg | ORAL_TABLET | Freq: Every day | ORAL | Status: DC
  Administered 2014-11-29 – 2014-11-30 (×2): 80 mg via ORAL
  Filled 2014-11-28 (×2): qty 4

## 2014-11-28 NOTE — Progress Notes (Signed)
[facility] PROGRESS NOTE  [name] WUJ:[id number] DOB: [birth year] DOA: 11/23/2014 PCP: Curly Rim, MD  HPI [name] is a 81 yo male who presented to the hospital complaining of poor oral intake and generalized weakness that had progressively worsened over 2 weeks. He was recently diagnosed with shingles of the throat and R ear by ENT. Patient was admitted due to poor oral intake causing weakness secondary to shingles pain. PMH includes: paroxysmal atrial fibrillation on amiodarone, hypertension, recent diagnosis of herpes zoster/shingles, COPD, and ischemic cardiomyopathy  Subjective: Patient is doing ok today. Still reports pain in the R ear and difficulty swallowing foods. Daughter is very concerned about facial droop. Denies N/V/D, or abdominal pain. No chest pain or SOB.   Assessment/Plan: Principal Problem:  Herpes Zoster: Patient admitted with shingles infection to the R ear and throat (per scope by ENT). ENT following. Pain is currently under control. Patient is on Acyclovir, continue with supportive care.   Active Problems:  Bell's Palsy: Patient experiencing moderate paralysis of the R side of the face (no movement of eyebrows). No other focal neurological deficits. Likely secondary to Herpes Zoster infection. CT without an acute abnormality. Will obtain MRI to confirm no acute intracranial process and initiate steroid therapy.   Dysphagia: Patient reports difficulty with swallowing foods, no history of esophageal strictures. Etiology unknown, but likely secondary to vagus nerve involvement with herpes zoster infection. Contributing to generalized weakness from poor PO intake. Will consult speech to determine appropriate diet for patient.   Hyperglycemia: No history of diabetes, HgbA1c 6.0. Per daughter's request, CBG checks discontinued. Continue carb-healthy diet and FU w/ PCP for outpatient management.    Hypertension: 157/65 on admission, managed at home with  Losartan and Norvasc. Discontinued at home medications on admission due to weakness. Remains stable, continue to monitor.    Hyponatremia: 131 on admission, secondary to poor oral intake. Encouraged PO intake, will monitor.   Paroxysmal Afib: Chronic, managed at home with Amiodarone, Aspirin and Plavix. Currently stable, NSR. Continue at-home meds.  COPD: Chronic, managed at home with Albuterol currently stable without exacerbation. Continue supportive therapy and monitor.  Thrush: Patient presented under treatment for oral thrush. Symptoms remain. Initiated Nystatin, continue monitor.   Code Status: Full Family Communication: Daughter at bedside  Disposition Plan: SNF when stable    Consultants:  ENT  Nutritionist   Procedures:  None  Antibiotics: Anti-infectives    Start     Dose/Rate Route Frequency Ordered Stop   11/23/14 1745  acyclovir (ZOVIRAX) 850 mg in dextrose 5 % 150 mL IVPB     850 mg 167 mL/hr over 60 Minutes Intravenous 3 times per day 11/23/14 1733          Objective: Filed Vitals:   11/28/14 0957  BP:   Pulse:   Temp: 97.9 F (36.6 C)  Resp:     Intake/Output Summary (Last 24 hours) at 11/28/14 1249 Last data filed at 11/27/14 1743  Gross per 24 hour  Intake    120 ml  Output    300 ml  Net   -180 ml   Filed Weights   11/23/14 2008  Weight: 84.823 kg (187 lb)    Exam:   General:  Frail appearing man, in bed with no acute distress  HEENT: R ear with crusted lesions, erythematous and edematous. Tongue with thick, brown coating (not able to scrape off)  Cardiovascular: RRR, no m/r/g. No peripheral edema  Respiratory: CTA b/l. No wheeze or crackles  Abdomen: Soft, non-tender, non-distended. + BS  Neuro: A&Ox 3, grip strength equal bilaterally. R sided facial paralysis (unable to raise eyebrow, droop). Appropriate movement of upper and lower extremities.   Data Reviewed: Basic Metabolic Panel:  Recent Labs Lab 11/22/14 2235  11/23/14 1618 11/23/14 2049 11/24/14 0655  NA 134* 133*  --  131*  K 3.5 3.8  --  3.5  CL 98* 99*  --  98*  CO2 29 27  --  28  GLUCOSE 139* 116*  --  163*  BUN 14 9  --  10  CREATININE 0.98 0.91  --  0.89  CALCIUM 10.6* 11.2*  --  10.7*  MG  --   --  2.0  --   PHOS  --   --  2.4*  --    Liver Function Tests:  Recent Labs Lab 11/23/14 1618  AST 36  ALT 11*  ALKPHOS 83  BILITOT 1.0  PROT 6.8  ALBUMIN 3.5   No results for input(s): LIPASE, AMYLASE in the last 168 hours. No results for input(s): AMMONIA in the last 168 hours. CBC:  Recent Labs Lab 11/22/14 2235 11/23/14 1618 11/24/14 0655  WBC 5.8 5.3 3.0*  HGB 13.4 13.5 13.2  HCT 39.8 40.7 39.1  MCV 92.1 91.9 91.1  PLT 168 184 183   Cardiac Enzymes: No results for input(s): CKTOTAL, CKMB, CKMBINDEX, TROPONINI in the last 168 hours. BNP (last 3 results) No results for input(s): BNP in the last 8760 hours.  ProBNP (last 3 results)  Recent Labs  11/29/13 1153  PROBNP 123.0*    CBG:  Recent Labs Lab 11/23/14 2152 11/24/14 0636 11/24/14 1106 11/24/14 1634  GLUCAP 166* 148* 138* 145*    Recent Results (from the past 240 hour(s))  Rapid strep screen     Status: None   Collection Time: 11/22/14  9:26 PM  Result Value Ref Range Status   Streptococcus, Group A Screen (Direct) NEGATIVE NEGATIVE Final    Comment: (NOTE) A Rapid Antigen test may result negative if the antigen level in the sample is below the detection level of this test. The FDA has not cleared this test as a stand-alone test therefore the rapid antigen negative result has reflexed to a Group A Strep culture.   Culture, Group A Strep     Status: None   Collection Time: 11/22/14  9:26 PM  Result Value Ref Range Status   Strep A Culture Negative  Final    Comment: (NOTE) Performed At: [facility] [place], [place] [id number] Lindon Romp MD GU:[id number]   Ear culture     Status: None   Collection Time:  11/24/14  1:41 PM  Result Value Ref Range Status   Specimen Description EAR  Final   Special Requests RIGHT EAR FLUID ON SWAB  Final   Culture   Final    NO GROWTH 2 DAYS Performed at [facility]    Report Status 11/27/2014 FINAL  Final     Studies: Ct Head Wo Contrast  11/26/2014   CLINICAL DATA:  RIGHT facial and mouth droop, leaning to the RIGHT, generalized body weakness for 3 weeks  EXAM: CT HEAD WITHOUT CONTRAST  TECHNIQUE: Contiguous axial images were obtained from the base of the skull through the vertex without intravenous contrast.  COMPARISON:  11/10/2013  FINDINGS: Generalized atrophy.  Normal ventricular morphology.  No midline shift or mass effect.  Small vessel chronic ischemic changes of deep cerebral white matter.  No intracranial hemorrhage, mass lesion or evidence acute infarction.  No extra-axial fluid collections.  Visualized paranasal sinuses and mastoid air cells clear.  No acute osseous findings.  Atherosclerotic calcifications at the carotid siphons.  IMPRESSION: Atrophy with small vessel chronic ischemic changes of deep cerebral white matter.  No acute intracranial abnormalities.   Electronically Signed   By: Lavonia Dana M.D.   On: 11/26/2014 15:17    Scheduled Meds: . acyclovir  850 mg Intravenous 3 times per day  . amiodarone  200 mg Oral Daily  . aspirin  81 mg Oral Daily  . carbidopa-levodopa  1 tablet Oral TID  . clopidogrel  75 mg Oral Daily  . feeding supplement (ENSURE ENLIVE)  237 mL Oral BID BM  . heparin  5,000 Units Subcutaneous 3 times per day  . isosorbide mononitrate  60 mg Oral QHS  . neomycin-polymyxin-hydrocortisone  3 drop Right Ear 4 times per day  . pantoprazole  40 mg Oral Daily   Continuous Infusions: . dextrose 5 % and 0.9% NaCl 50 mL/hr at 11/27/14 1705    Principal Problem:   Herpes zoster Active Problems:   HTN (hypertension)   Paroxysmal a-fib- NSR on Amiodarone   COPD (chronic obstructive pulmonary disease) (HCC)    Hyperglycemia   Decreased oral intake   Protein-calorie malnutrition, severe ([place])   Malnutrition ([place])     Kingsten Enfield, Student-PA  [facility] If 7PM-7AM, please contact night-coverage at [email or web address], password [facility] 11/28/2014, 12:49 PM  LOS: 4 days

## 2014-11-28 NOTE — Progress Notes (Signed)
11/28/2014 11:45 AM  [name] [id number]  Hospital Day 6    Temp:  [97.9 F (36.6 C)-98.4 F (36.9 C)] 97.9 F (36.6 C) (10/05 0957) Pulse Rate:  [59-73] 73 (10/05 0605) Resp:  [16-20] 18 (10/05 0605) BP: (120-151)/(50-68) 136/50 mmHg (10/05 0605) SpO2:  [94 %-100 %] 94 % (10/05 0605),     Intake/Output Summary (Last 24 hours) at 11/28/14 1145 Last data filed at 11/27/14 1743  Gross per 24 hour  Intake    360 ml  Output    300 ml  Net     60 ml    No results found for this or any previous visit (from the past 24 hour(s)).  SUBJECTIVE:  Still large throat pain and some choking with swallowing  OBJECTIVE:  RIGHT ear drying nicely.  Slowly progressive RIGHT facial weakness.  IMPRESSION:  This may now have a component of Herpes Zoster oticus.    PLAN:  I agree with plan for MRI head.  Swallow eval pending. No systemic antibiotics needed.  [name]

## 2014-11-28 NOTE — Evaluation (Signed)
Clinical/Bedside Swallow Evaluation Patient Details  Name: [name] MRN: [id number] Date of Birth: 09-12-1933  Today's Date: 11/28/2014 Time: SLP Start Time (ACUTE ONLY): 1250 SLP Stop Time (ACUTE ONLY): 1315 SLP Time Calculation (min) (ACUTE ONLY): 25 min  Past Medical History:  Past Medical History  Diagnosis Date  . CAD (coronary artery disease)     a. s/p DES-LCx 2006 b. repeat cath 2011- 90% distal LCx ISR, 100% mid RCA CTO, diffuse nonobstructive dz, medical management c. 07/2012 cath- 100% distal LCx ISR, 100% mid RCA CTO, nonobstructive dz; EF 40-45%  . PVC (premature ventricular contraction)   . HTN (hypertension)   . Paroxysmal a-fib   . PAD (peripheral artery disease)     history of stenting in the past  . COPD (chronic obstructive pulmonary disease)   . LV dysfunction   . Anemia, iron deficiency   . Family history of anesthesia complication     "daughter gets PONV" (11/20/2013)  . Anginal pain   . Pneumonia 1-2 X's  . History of blood transfusion     "he's had several; probably related to his cancer" (11/20/2013)  . GERD (gastroesophageal reflux disease)   . H/O hiatal hernia   . History of gastric ulcer   . Arthritis     "generalized"  . Chronic lower back pain   . Anxiety   . Myocardial infarction 09/2009; ~ 2014    [name] 10/24/2009;   . Lung cancer dx'd 11/2005    non small cell   Past Surgical History:  Past Surgical History  Procedure Laterality Date  . Lobectomy Left     left upper  . Appendectomy    . Anterior cervical decomp/discectomy fusion    . Twenty four hour intraesophageal ph profile.  05/02/2001  . Aortogram with bilateral lower extremity runoff.  07/11/2003  . Fiberoptic bronchoscopy, mediastinum.  10/20/2005  . Video assisted thoracoscopy Left 12/01/2005  . Esophagogastroduodenoscopy with biopsy.  04/14/2010  . Iliac artery stent Right 11/20/2013  . Tonsillectomy  1972  . Inguinal hernia repair Bilateral     "I've had several"  .  Back surgery      "I've had several back surgeries; got spacers down in lower back" (11/20/2013)  . Cataract extraction w/ intraocular lens  implant, bilateral Bilateral   . Coronary angioplasty with stent placement  2006    DES to LCX/notes 01/04/2014  . Cardiac catheterization  2011; 07/2012    [name] 01/04/2014  . Femoral artery stent Left 01/04/2014  . Left heart catheterization with coronary angiogram N/A 08/19/2012    Procedure: LEFT HEART CATHETERIZATION WITH CORONARY ANGIOGRAM;  Surgeon: Burnell Blanks, MD;  Location: [facility] CATH LAB;  Service: Cardiovascular;  Laterality: N/A;  . Lower extremity angiogram N/A 11/20/2013    Procedure: LOWER EXTREMITY ANGIOGRAM;  Surgeon: Lorretta Harp, MD;  Location: [facility] CATH LAB;  Service: Cardiovascular;  Laterality: N/A;  . Lower extremity angiogram N/A 01/04/2014    Procedure: LOWER EXTREMITY ANGIOGRAM;  Surgeon: Lorretta Harp, MD;  Location: [facility] CATH LAB;  Service: Cardiovascular;  Laterality: N/A;   HPI:  81 y/o male with a PMH of paroxysmal a-fib, HTN, shingles affecting throat and right ear, COPD, and ischemic cardiomyopathy. Pt presents with complaints of progressive poor oral intake and general weakness for past 2 weeks.  ENT following - pt may have component of Herpes Zoster oticus.  C/O throat pain on right.  There is clear deficit of CN VII on right, LMN.  Pt with  reported choking associated with PO intake per daughter.    Assessment / Plan / Recommendation Clinical Impression  Pt presents with clear right CN VII impairment, impacting upper and lower face with reduced eye closure, no labial retraction on right, reduced NL fold on right.  No other CN involvement noted per assessment. Tongue covered in brown thrush-like coating. Pt C/O odynophagia.  There were no overt s/s of aspiration, but multiple swallows per bolus regardless of consistency.  Laryngeal function appears to be WNL.  Recommend pt continue with primarily liquids, soft  solids for comfort - eat in upright position.  D/W dtr, pt.  Will follow briefly for toleration and safety given mild deficits and symptoms described by pt.  D/W MD.     Aspiration Risk  Mild    Diet Recommendation Age appropriate regular solids;Thin   Medication Administration: Crushed with puree    Other  Recommendations Oral Care Recommendations: Oral care BID   Follow Up Recommendations       Frequency and Duration min 2x/week  1 week       Swallow Study Prior Functional Status       General Date of Onset: 11/28/14 Other Pertinent Information: 81 y/o male with a PMH of paroxysmal a-fib, HTN, shingles, COPD, and ischemic cardiomyopathy. Pt presents with complaints of progressive poor oral intake and general weakness for past 2 weeks.  ENT following - pt may have component of Herpes Zoster oticus.  C/O throat pain on right.  There is clear deficit of CN VII on right LMN.  Pt with reported choking associated with PO intake per daughter.  Type of Study: Bedside swallow evaluation Previous Swallow Assessment: none Diet Prior to this Study: Regular;Thin liquids Temperature Spikes Noted: No Respiratory Status: Room air Behavior/Cognition: Alert;Cooperative Oral Cavity - Dentition: Adequate natural dentition/normal for age Self-Feeding Abilities: Able to feed self Patient Positioning: Upright in bed Volitional Cough: Strong Volitional Swallow: Able to elicit    Oral/Motor/Sensory Function Overall Oral Motor/Sensory Function: Impaired (right LMN CN VII involvement - impacts upper and lower face)   Ice Chips Ice chips: Within functional limits   Thin Liquid Thin Liquid: Impaired Presentation: Cup;Straw Pharyngeal  Phase Impairments: Multiple swallows    Nectar Thick Nectar Thick Liquid: Not tested   Honey Thick Honey Thick Liquid: Not tested   Puree Puree: Impaired Presentation: Self Fed;Spoon Pharyngeal Phase Impairments: Multiple swallows   Solid   [name] L. [name], [place]  CCC/SLP Pager [phone]     Solid: Not tested       [name] 11/28/2014,1:28 PM

## 2014-11-29 DIAGNOSIS — E876 Hypokalemia: Secondary | ICD-10-CM

## 2014-11-29 LAB — BASIC METABOLIC PANEL
ANION GAP: 5 (ref 5–15)
BUN: 5 mg/dL — ABNORMAL LOW (ref 6–20)
CHLORIDE: 97 mmol/L — AB (ref 101–111)
CO2: 30 mmol/L (ref 22–32)
CREATININE: 0.77 mg/dL (ref 0.61–1.24)
Calcium: 9.9 mg/dL (ref 8.9–10.3)
GFR calc non Af Amer: >60 mL/min (ref >60)
Glucose, Bld: 129 mg/dL — ABNORMAL HIGH (ref 65–99)
POTASSIUM: 2.8 mmol/L — AB (ref 3.5–5.1)
Sodium: 132 mmol/L — ABNORMAL LOW (ref 135–145)

## 2014-11-29 MED ORDER — BOOST / RESOURCE BREEZE PO LIQD
1.0000 | ORAL | Status: DC
  Filled 2014-11-29 (×2): qty 1

## 2014-11-29 MED ORDER — LOSARTAN POTASSIUM 50 MG PO TABS
100.0000 mg | ORAL_TABLET | Freq: Every day | ORAL | Status: DC
  Administered 2014-11-29 – 2014-11-30 (×2): 100 mg via ORAL
  Filled 2014-11-29 (×3): qty 2

## 2014-11-29 MED ORDER — NYSTATIN 100000 UNIT/ML MT SUSP
5.0000 mL | Freq: Four times a day (QID) | OROMUCOSAL | Status: DC
  Administered 2014-11-29 (×3): 500000 [IU] via ORAL
  Filled 2014-11-29 (×8): qty 5

## 2014-11-29 MED ORDER — POTASSIUM CHLORIDE CRYS ER 20 MEQ PO TBCR
40.0000 meq | EXTENDED_RELEASE_TABLET | ORAL | Status: AC
  Administered 2014-11-29 (×2): 40 meq via ORAL
  Filled 2014-11-29 (×2): qty 2

## 2014-11-29 MED ORDER — BOOST PLUS PO LIQD
237.0000 mL | Freq: Three times a day (TID) | ORAL | Status: DC
  Administered 2014-11-29 – 2014-11-30 (×2): 237 mL via ORAL
  Filled 2014-11-29 (×8): qty 237

## 2014-11-29 NOTE — Progress Notes (Signed)
Physical Therapy Treatment Patient Details Name: [name] MRN: [id number] DOB: 30-Oct-1933 Today's Date: 11/29/2014    History of Present Illness Pt is a 81 y/o male with a PMH of paroxysmal a-fib, HTN, shingles, COPD, and ischemic cardiomyopathy. Pt presents with complaints of progressive poor oral intake and general weakness for past 2 weeks. Reportedly pt had a scope showing shingles outbreak at the throat.     PT Comments    Patient reluctantly participating due to sleeping initially and reports lots of interruptions to sleep.  Feel he brightened during treatment and, although poor awareness of balance deficit, seemed to push himself some with increased distance with ambulation as well as for work on balance at bedside.  Continues to have good family support when they can be local, but SNF rehab needed for max level of recovery prior to d/c home as no assist available.  Follow Up Recommendations  SNF;Supervision/Assistance - 24 hour     Equipment Recommendations  Rolling walker with 5" wheels    Recommendations for Other Services       Precautions / Restrictions Precautions Precautions: Fall    Mobility  Bed Mobility Overal bed mobility: Needs Assistance Bed Mobility: Sidelying to Sit;Supine to Sit   Sidelying to sit: Min assist Supine to sit: Mod assist     General bed mobility comments: increased time and effortful coming up to sit, assist with legs in bed to supine  Transfers Overall transfer level: Needs assistance Equipment used: Rolling walker (2 wheeled) Transfers: Sit to/from Stand Sit to Stand: Min assist;Mod assist         General transfer comment: cues for safety and hand over had to reach back for rail to sit otherwise sits uncontrolled, appropriate anterior weight shift to stand, but leans back once standing  Ambulation/Gait Ambulation/Gait assistance: Mod assist Ambulation Distance (Feet): 90 Feet Assistive device: Rolling walker (2  wheeled) Gait Pattern/deviations: Step-through pattern;Decreased weight shift to left;Decreased stride length;Trunk flexed;Wide base of support     General Gait Details: Cues for forward gaze to assist with awareness of upright, facilitation through trunk for left weight shift in left stance, assist for walker with turning, cued on return to room when fatigued to have his left hip touch mine to improve left weight shift   Stairs            Wheelchair Mobility    Modified Rankin (Stroke Patients Only)       Balance Overall balance assessment: Needs assistance           Standing balance-Leahy Scale: Poor Standing balance comment: worked on standing at bedside with alternate steps forward and back and cues for overshifting to left with right stepping, attempted with single UE supported, but demonstrated increased posterior lean and right trunk collapse without UE support on right                    Cognition Arousal/Alertness: Awake/alert   Overall Cognitive Status: Impaired/Different from baseline Area of Impairment: Awareness;Following commands       Following Commands: Follows one step commands with increased time Safety/Judgement: Decreased awareness of deficits Awareness: Intellectual   General Comments: unable to correct balance with visual strategy, verbal cues and seems unaware despite having this issue for several days    Exercises      General Comments General comments (skin integrity, edema, etc.): stood at end of session to urinate pt using left hand to manage toileting activity and able to stand with  min/mod A +2 for balance during toileting activity      Pertinent Vitals/Pain Pain Assessment: Faces Faces Pain Scale: Hurts a little bit (reports pain easing off) Pain Location: lower back Pain Descriptors / Indicators: Sore Pain Intervention(s): Monitored during session;Repositioned    Home Living                      Prior  Function            PT Goals (current goals can now be found in the care plan section) Progress towards PT goals: Progressing toward goals    Frequency  Min 3X/week    PT Plan Current plan remains appropriate    Co-evaluation             End of Session Equipment Utilized During Treatment: Gait belt Activity Tolerance: Patient limited by fatigue Patient left: in bed;with family/visitor present     Time: 2548-6282 PT Time Calculation (min) (ACUTE ONLY): 33 min  Charges:  $Gait Training: 8-22 mins $Therapeutic Activity: 8-22 mins                    G Codes:      Jigar Zielke,CYNDI 12/22/2014, 11:48 AM  Magda Kiel, [place] 12/22/2014

## 2014-11-29 NOTE — Progress Notes (Signed)
Speech Language Pathology Treatment: Dysphagia  Patient Details Name: [name] MRN: [id number] DOB: Jun 25, 1933 Today's Date: 11/29/2014 Time: 8616-8372 SLP Time Calculation (min) (ACUTE ONLY): 10 min  Assessment / Plan / Recommendation Clinical Impression  Pt reports that his odynophagia is improving, but that he has not eaten much due to lack of appetite. Immediate coughing was noted x1 following several consecutive straw sips, likely related to reduced oral containment and premature spillage. With Min cues from SLP for more appropriate pacing, he tolerates thin liquids and regular textures well. Given that pt is still having some intermittent coughing with PO intake, will continue to follow briefly.   HPI Other Pertinent Information: 81 y/o male with a PMH of paroxysmal a-fib, HTN, shingles affecting throat and right ear, COPD, and ischemic cardiomyopathy. Pt presents with complaints of progressive poor oral intake and general weakness for past 2 weeks.  ENT following - pt may have component of Herpes Zoster oticus.  C/O throat pain on right.  There is clear deficit of CN VII on right LMN.  Pt with reported choking associated with PO intake per daughter.    Pertinent Vitals Pain Assessment: Faces Faces Pain Scale: Hurts a little bit Pain Location: odynophagia Pain Intervention(s): Limited activity within patient's tolerance;Monitored during session  SLP Plan  Continue with current plan of care    Recommendations Diet recommendations: Regular;Thin liquid Liquids provided via: Cup;Straw Medication Administration: Crushed with puree Supervision: Patient able to self feed;Intermittent supervision to cue for compensatory strategies Compensations: Slow rate;Small sips/bites Postural Changes and/or Swallow Maneuvers: Seated upright 90 degrees       Oral Care Recommendations: Oral care BID Follow up Recommendations: None Plan: Continue with current plan of care    Germain Osgood,  M.A. CCC-SLP [phone]  Germain Osgood 11/29/2014, 4:58 PM

## 2014-11-29 NOTE — Progress Notes (Addendum)
[facility] PROGRESS NOTE  ILYAAS MUSTO STM:[id number] DOB: 10/19/1933 DOA: 11/23/2014 PCP: Curly Rim, MD  HPI [name] is a 81 yo male who presented to the hospital complaining of poor oral intake and generalized weakness that had progressively worsened over 2 weeks. He was recently diagnosed with shingles of the throat and R ear by ENT. Patient was admitted due to poor oral intake causing weakness secondary to shingles pain. PMH includes: paroxysmal atrial fibrillation on amiodarone, hypertension, recent diagnosis of herpes zoster/shingles, COPD, and ischemic cardiomyopathy  Subjective: Patient is doing better today. Continues to report difficulty with swallowing foods (thickened). 1 episode of emesis in AM secondary to trying to eat foods. Pain in R ear improving. Denies N/V/D, or abdominal pain. No chest pain or SOB.  Assessment/Plan: Principal Problem:  Herpes Zoster: Patient admitted with shingles infection to the R ear and throat (per scope by ENT). ENT following. Pain is remains under control. Patient is currently on IV Acyclovir, will transition to oral therapy tomorrow in anticipation of discahrge. Patient will need to remain on oral acyclovir for 3-5 more days. Continue with supportive care.   Active Problems:  Bell's Palsy: Patient continues to experience moderate paralysis of the R side of the face (no movement of eyebrows, able to partially close eyelid) with no other focal neurological deficits. Likely secondary to Herpes Zoster infection (possibly [place]). CT/MRI without acute abnormality. Continue steroid therapy (for 7 days total) and monitor.    Dysphagia: Patient experiencing difficulty with swallowing foods. Etiology unknown, but likely secondary to vagus nerve involvement with herpes zoster infection. Contributing to generalized weakness from poor PO intake. SLP consulted, recommends age-appropriate regular solids. Still expressing difficulty today,  advised to continue diet/Ensure as tolerated and monitor.   Hyperglycemia: No history of diabetes, HgbA1c 6.0. Per daughter's request, CBG checks discontinued. Continue carb-healthy diet and FU w/ PCP for outpatient management.   Hypertension: 157/65 on admission, managed at home with Losartan and Norvasc. Discontinued at home medications on admission due to weakness. Starting to elevate today (highest: 160/66). Will re-start Losartan and monitor.    Hypokalemia: replete and recheck  Hyponatremia: 131 on admission, secondary to poor oral intake. Improving (132 today). Continued to encourage PO intake, will monitor.   Paroxysmal Afib: Chronic, managed at home with Amiodarone, Aspirin and Plavix. Not a candidate for anticoagulation given GI bleeding. Currently stable, NSR. Continue at-home meds.  COPD: Chronic, managed at home with Albuterol. Currently stable without exacerbation. Continue supportive therapy and monitor.  Thrush: Patient presented under treatment for oral thrush. Symptoms remain. Will reinitiate Nystatin, continue monitor.   Deconditioning: Secondary to acute illness-frailty-recommendations from physical therapy for SNF.  Severe Malnutrition:continue Supplements  Code Status: Full Family Communication: Daughter at bedside  Disposition Plan: SNF when stable   Consultants:  ENT  SLP/PT  Nutritionist   Procedures:  None  Antibiotics: Anti-infectives    Start     Dose/Rate Route Frequency Ordered Stop   11/23/14 1745  acyclovir (ZOVIRAX) 850 mg in dextrose 5 % 150 mL IVPB     850 mg 167 mL/hr over 60 Minutes Intravenous 3 times per day 11/23/14 1733          Objective: Filed Vitals:   11/29/14 0936  BP: 160/66  Pulse: 60  Temp: 98 F (36.7 C)  Resp: 16   No intake or output data in the 24 hours ending 11/29/14 1049 Filed Weights   11/23/14 2008  Weight: 84.823 kg (187 lb)  Exam:   General: Elderly appearing man, sitting in bed with no  acute distress  HEENT: R ear with crusted lesions, erythematous and edematous, improved from yesterday. Tongue continues to have thick, brown/white coating (not able to scrape off)  Cardiovascular: RRR, no m/r/g. No peripheral edema  Respiratory: CTA b/l. No wheeze or crackles  Abdomen: Soft, non-tender, non-distended. + BS  Neuro: A&Ox 3, grip strength equal bilaterally. R sided facial paralysis (unable to raise eyebrow, mouth droop, able to close eyelid partially). Appropriate movement of upper and lower extremities.  Data Reviewed: Basic Metabolic Panel:  Recent Labs Lab 11/22/14 2235 11/23/14 1618 11/23/14 2049 11/24/14 0655 11/29/14 0658  NA 134* 133*  --  131* 132*  K 3.5 3.8  --  3.5 2.8*  CL 98* 99*  --  98* 97*  CO2 29 27  --  28 30  GLUCOSE 139* 116*  --  163* 129*  BUN 14 9  --  10 5*  CREATININE 0.98 0.91  --  0.89 0.77  CALCIUM 10.6* 11.2*  --  10.7* 9.9  MG  --   --  2.0  --   --   PHOS  --   --  2.4*  --   --    Liver Function Tests:  Recent Labs Lab 11/23/14 1618  AST 36  ALT 11*  ALKPHOS 83  BILITOT 1.0  PROT 6.8  ALBUMIN 3.5   No results for input(s): LIPASE, AMYLASE in the last 168 hours. No results for input(s): AMMONIA in the last 168 hours. CBC:  Recent Labs Lab 11/22/14 2235 11/23/14 1618 11/24/14 0655  WBC 5.8 5.3 3.0*  HGB 13.4 13.5 13.2  HCT 39.8 40.7 39.1  MCV 92.1 91.9 91.1  PLT 168 184 183   Cardiac Enzymes: No results for input(s): CKTOTAL, CKMB, CKMBINDEX, TROPONINI in the last 168 hours. BNP (last 3 results) No results for input(s): BNP in the last 8760 hours.  ProBNP (last 3 results)  Recent Labs  11/29/13 1153  PROBNP 123.0*    CBG:  Recent Labs Lab 11/23/14 2152 11/24/14 0636 11/24/14 1106 11/24/14 1634  GLUCAP 166* 148* 138* 145*    Recent Results (from the past 240 hour(s))  Rapid strep screen     Status: None   Collection Time: 11/22/14  9:26 PM  Result Value Ref Range Status   Streptococcus,  Group A Screen (Direct) NEGATIVE NEGATIVE Final    Comment: (NOTE) A Rapid Antigen test may result negative if the antigen level in the sample is below the detection level of this test. The FDA has not cleared this test as a stand-alone test therefore the rapid antigen negative result has reflexed to a Group A Strep culture.   Culture, Group A Strep     Status: None   Collection Time: 11/22/14  9:26 PM  Result Value Ref Range Status   Strep A Culture Negative  Final    Comment: (NOTE) Performed At: [facility] [place], [place] [id number] Lindon Romp MD FY:[id number]   Ear culture     Status: None   Collection Time: 11/24/14  1:41 PM  Result Value Ref Range Status   Specimen Description EAR  Final   Special Requests RIGHT EAR FLUID ON SWAB  Final   Culture   Final    NO GROWTH 2 DAYS Performed at [facility]    Report Status 11/27/2014 FINAL  Final     Studies: Mr Brain Wo Contrast  11/28/2014   CLINICAL DATA:  Herpes infection of the right ear and throat. Right-sided facial droop.  EXAM: MRI HEAD WITHOUT CONTRAST  TECHNIQUE: Multiplanar, multiecho pulse sequences of the brain and surrounding structures were obtained without intravenous contrast.  COMPARISON:  CT head without contrast 11/26/2014.  FINDINGS: Diffusion-weighted images demonstrate no evidence for acute or subacute infarction. No acute hemorrhage or mass lesion is present. The ventricles are proportionate to the degree of atrophy. Asymmetric left periventricular white matter changes are noted. Scattered subcortical T2 changes are present bilaterally.  White matter changes extend into the brainstem. The internal auditory canals are clear bilaterally. With  Flow is present in the major intracranial arteries. Bilateral lens replacements are noted. The paranasal sinuses are clear. There is fluid in the right mastoid air cells and middle ear cavity. Minimal fluid is present in the left  mastoid air cells as well.  IMPRESSION: 1. Right middle ear and mastoid effusion compatible with suggested acute infection. There is some edema along the external auditory canal mucosa as well. 2. Moderate atrophy and diffuse white matter disease. This likely reflects the sequela of chronic microvascular ischemia. 3. No acute intracranial abnormality.   Electronically Signed   By: San Morelle M.D.   On: 11/28/2014 19:36    Scheduled Meds: . acyclovir  850 mg Intravenous 3 times per day  . amiodarone  200 mg Oral Daily  . aspirin  81 mg Oral Daily  . carbidopa-levodopa  1 tablet Oral TID  . clopidogrel  75 mg Oral Daily  . feeding supplement (ENSURE ENLIVE)  237 mL Oral BID BM  . heparin  5,000 Units Subcutaneous 3 times per day  . isosorbide mononitrate  60 mg Oral QHS  . neomycin-polymyxin-hydrocortisone  3 drop Right Ear 4 times per day  . pantoprazole  40 mg Oral Daily  . predniSONE  80 mg Oral Q breakfast   Continuous Infusions: . dextrose 5 % and 0.9% NaCl 50 mL/hr at 11/27/14 1705    Principal Problem:   Herpes zoster Active Problems:   HTN (hypertension)   Paroxysmal a-fib- NSR on Amiodarone   COPD (chronic obstructive pulmonary disease) (HCC)   Hyperglycemia   Decreased oral intake   Protein-calorie malnutrition, severe ([place])   Malnutrition ([place])   Bell's palsy       Micayla Zeltman, Student-PA  [facility] If 7PM-7AM, please contact night-coverage at [email or web address], password [facility] 11/29/2014, 10:49 AM  LOS: 5 days    Attending MD note  Patient was seen, examined,treatment plan was discussed with the PA-S.  I have personally reviewed the clinical findings, lab, imaging studies and management of this patient in detail. I agree with the documentation, as recorded by the PA-S.   Essentially unchanged from yesterday-continues to have right-sided facial weakness and a patent consistent with a lower motor neuron lesion-CN VII-suspect Bell's palsy. MRI  brain negative. Started on prednisone, continuing acyclovir. Supplement potassium-recheck electrolytes in a.m., suspect SNF on 10/7. Suspect deconditioning/generalized weakness will improve with PT at SNF.   [facility]

## 2014-11-29 NOTE — Clinical Social Work Note (Addendum)
CSW spoke with Anderson Malta at [facility]. Anderson Malta reported that she can no longer accept the pt. CSW met the pt's daughter Ivin Booty at the bedside to inform her regarding the changes. CSW provided Ivin Booty a SNF list to review. CSW is awaiting a call from [place] with her top 4 SNF choices.   Addendum: CSW informed by Ivin Booty that she would like the pt to go to [facility]. Regina from [facility] will complete admission paperwork with Ivin Booty in the morning.   [place], MSW, [place]

## 2014-11-29 NOTE — Progress Notes (Signed)
Initial Nutrition Assessment  DOCUMENTATION CODES:   Severe malnutrition in context of acute illness/injury  INTERVENTION:  Provide Boost Plus BID in between meals, provides 360 kcal and 13 grams of protein Provide Boost Breeze po once daily, each supplement provides 250 kcal and 9 grams of protein Provide [employer] Breakfast once daily   NUTRITION DIAGNOSIS:   Inadequate oral intake related to dysphagia, acute illness, poor appetite as evidenced by energy intake < or equal to 50% for > or equal to 5 days, per patient/family report.  Ongoing  GOAL:   Patient will meet greater than or equal to 90% of their needs  Unmet  MONITOR:   PO intake, Supplement acceptance, Labs, I & O's  REASON FOR ASSESSMENT:   Consult Assessment of nutrition requirement/status  ASSESSMENT:   81 year old male with history of paroxysmal atrial fibrillation on amiodarone, hypertension, recent diagnosis of herpes zoster/shingles, COPD, and ischemic cardiomyopathy. Presents to the hospital complaining of poor oral intake and generalized weakness. The poor oral intake has been present for the last 2 weeks reportedly and has gotten progressively worse. He was recently diagnosed with shingles affecting his throat and ear.   RD re-consulted due to swallowing difficulty and Poor PO intake. Family at bedside report that patient is having difficulty swallowing Ensure due to it being too thick and is coughing on regular liquids due to being too thin. Per daughter, it has been trial and error with foods. Pt likes Ensure and Boost supplements. Boost Breeze trial at time of visit caused some choking. RD encouraged small sips with clearing of throat in between. Discussed the Ensure is thinner at room temperature. Encouraged frequent meals and snacks due to poor intake. Per nursing notes, pt is eating 25-50% of meals. Per family, pt is to be discharged to SNF tomorrow.   Labs: low sodium, low potassium, low  chloride  Diet Order:  Diet Carb Modified Fluid consistency:: Thin; Room service appropriate?: Yes  Skin:  Reviewed, no issues  Last BM:  10/3  Height:   Ht Readings from Last 1 Encounters:  11/23/14 '6\' 3"'$  (1.905 m)    Weight:   Wt Readings from Last 1 Encounters:  11/23/14 187 lb (84.823 kg)    Ideal Body Weight:  89.1 kg  BMI:  Body mass index is 23.37 kg/(m^2).  Estimated Nutritional Needs:   Kcal:  1950-2100 (23-25 kcal/kg)  Protein:  85-102 g (1-1.2 g/kg bw)  Fluid:  2 liters  EDUCATION NEEDS:   No education needs identified at this time  [place], LDN Inpatient Clinical Dietitian Pager: [phone] After Hours Pager: [phone]

## 2014-11-30 DIAGNOSIS — E43 Unspecified severe protein-calorie malnutrition: Secondary | ICD-10-CM

## 2014-11-30 LAB — BASIC METABOLIC PANEL
ANION GAP: 9 (ref 5–15)
BUN: 9 mg/dL (ref 6–20)
CALCIUM: 10.9 mg/dL — AB (ref 8.9–10.3)
CO2: 28 mmol/L (ref 22–32)
Chloride: 97 mmol/L — ABNORMAL LOW (ref 101–111)
Creatinine, Ser: 0.79 mg/dL (ref 0.61–1.24)
GFR calc Af Amer: >60 mL/min (ref >60)
GFR calc non Af Amer: >60 mL/min (ref >60)
GLUCOSE: 105 mg/dL — AB (ref 65–99)
POTASSIUM: 3.7 mmol/L (ref 3.5–5.1)
Sodium: 134 mmol/L — ABNORMAL LOW (ref 135–145)

## 2014-11-30 MED ORDER — BOOST PLUS PO LIQD: 237.0000 mL | Freq: Three times a day (TID) | ORAL | Status: DC

## 2014-11-30 MED ORDER — PREDNISONE 20 MG PO TABS: 80.0000 mg | ORAL_TABLET | Freq: Every day | ORAL | Status: DC

## 2014-11-30 MED ORDER — BOOST / RESOURCE BREEZE PO LIQD: 1.0000 | ORAL | Status: DC

## 2014-11-30 MED ORDER — DIAZEPAM 2 MG PO TABS: 2.0000 mg | ORAL_TABLET | Freq: Three times a day (TID) | ORAL | Status: AC | PRN

## 2014-11-30 MED ORDER — NYSTATIN 100000 UNIT/ML MT SUSP: 5.0000 mL | Freq: Four times a day (QID) | OROMUCOSAL | Status: DC

## 2014-11-30 MED ORDER — BOOST PLUS PO LIQD
237.0000 mL | Freq: Three times a day (TID) | ORAL | Status: DC
  Administered 2014-11-30: 237 mL via ORAL
  Filled 2014-11-30 (×4): qty 237

## 2014-11-30 MED ORDER — LIDOCAINE VISCOUS 2 % MT SOLN: 15.0000 mL | Freq: Four times a day (QID) | OROMUCOSAL | Status: DC | PRN

## 2014-11-30 MED ORDER — HYDROCODONE-ACETAMINOPHEN 5-325 MG PO TABS: 1.0000 | ORAL_TABLET | Freq: Every day | ORAL | Status: DC | PRN

## 2014-11-30 NOTE — Discharge Summary (Addendum)
Physician Discharge Summary  [name]:[id number] DOB: 07-29-1933 DOA: 11/23/2014  PCP: Curly Rim, MD  Admit date: 11/23/2014 Discharge date: 11/30/2014  Time spent: 35 minutes  Recommendations for Outpatient Follow-up:  1. SNF- Patient is being discharged to [facility]  1. Please continue steroid x5 days (last dose 10/12) with no taper for Bell's Palsy  2. Please continue Nystatin for oral thrush (discontinue when symptoms resolve) 3. Please continue boost/ensure nutritional supplementation d/t poor oral intake secondary to dysphagia 4. Please repeat CBC/BMP in 2 weeks  5. Please ensure eventual FU with PCP for chronic medication conditions (HTN, COPD, Paroxysmal Afib, Hyperglycemia)   Discharge Diagnoses:  Principal Problem:   Herpes zoster Active Problems:   HTN (hypertension)   Paroxysmal a-fib- NSR on Amiodarone   COPD (chronic obstructive pulmonary disease) (HCC)   Hyperglycemia   Decreased oral intake   Protein-calorie malnutrition, severe ([place])   Malnutrition ([place])   Bell's palsy   Discharge Condition: Stable for d/c to SNF   Diet recommendation: None   Filed Weights   11/23/14 2008  Weight: 84.823 kg (187 lb)    History of present illness:  [name] is a 81 yo male who presented to the hospital complaining of poor oral intake and generalized weakness that had progressively worsened over 2 weeks. He was recently diagnosed with shingles of the throat and R ear by ENT. Patient was admitted due to poor oral intake causing weakness secondary to shingles pain. PMH includes: paroxysmal atrial fibrillation on amiodarone, hypertension, recent diagnosis of herpes zoster/shingles, COPD, and ischemic cardiomyopathy  Hospital Course:  Principal Problem:  Herpes Zoster: Patient admitted with shingles infection to the R ear and throat (per scope by ENT as outpatient). ENT followed throughout hospitalization. Patient received IV Acyclovir x 8 days and  Tetracain ear drops with significant improvement. All visible lesions have crusted. No medications need continued on discharge per ENT recommendations (spoke with Dr Erik Obey on 70/1). Pain controlled during hospitalization. Continue with supportive care on discharge.   Active Problems:  Bell's Palsy: Patient hospitalization complicated by new onset moderate paralysis of the R side of the face (no movement of eyebrows, able to partially close eyelid) with no other focal neurological deficits. Likely secondary to Herpes Zoster infection (possibly [place]). CT/MRI without acute abnormality. Initiated steroid therapy, no complications. No improvement of symptoms during hospital stay. Continue steroid therapy for 5 more days after discharge (Last dose on 10/12) without taper.  Dysphagia: Patient began to experience difficulty with swallowing foods during hospitalization. Etiology unknown, but likely secondary to vagus nerve involvement with herpes zoster infection. This is believed to be contributing to generalized weakness complaint from admission (poor PO intake). SLP consulted and recommended age-appropriate regular solids. Continue diet recommendations and boost/ensure nutritional supplements as tolerated on discharge.   Hyperglycemia: No history of diabetes, HgbA1c 6.0. Per daughter's request, CBG checks discontinued midway through hospitalization. Continue carb-healthy diet and FU w/ PCP for outpatient management on discharge.   Hypertension: BP was 157/65 on admission, managed at home with Losartan and Norvasc. Discontinued at home medications on admission due to weakness. BP started to elevate rise again on 10/6 (highest: 160/66). Losartan restarted and BP stabilized. Continue Losartan and Imdur on discharge and FU with PCP for HTN management.   Hypokalemia: Initially 2.7, likely secondary to poor PO intake. Resolved with repletion.  Hyponatremia: 131 on admission, secondary to poor oral  intake. Improved to 134 on discharge. Continued to encourage PO intake on discharge.  Paroxysmal Afib: Chronic, managed at home with Amiodarone, Aspirin and Plavix. Not a candidate for anticoagulation given hx of GI bleeding. Remained stable in NSR during hospitalization. Continue at-home meds on discharge.   COPD: Chronic, managed at home with Albuterol. No exacerbation during hospitalization. Continue at home medications on discharge.  Thrush: Patient presented with oral thrush. Initiated Nystatin therapy with mild symptom improvement. Continue Nystatin on discharge.   Deconditioning: Secondary to acute illness-frailty. SNF on discharge.  Severe Malnutrition: Secondary to poor PO intake/dysphagia. Initiate nutritional supplements. Continue on discharge.  Procedures:  None  Consultations:  ENT  SLP/PT  Nutritionist   Discharge Exam: Filed Vitals:   11/30/14 0542  BP: 142/60  Pulse: 66  Temp: 97.8 F (36.6 C)  Resp: 17     General: Elderly appearing man, sitting on side of bed eating breakfast, no acute distress  HEENT: R ear with crusted lesions, erythematous and edematous, much improved from admission. Tongue continues to have thick, brown/white coating (not able to scrape off)  Cardiovascular: RRR, no m/r/g. No peripheral edema  Respiratory: CTA b/l. No wheeze or crackles  Abdomen: Soft, non-tender, non-distended. + BS  Neuro: A&Ox 3, grip strength equal bilaterally. R sided facial paralysis (unable to raise eyebrow, mouth droop, able to close eyelid partially). Appropriate movement of upper and lower extremities.    Discharge Instructions Discharge Instructions    Call MD for:  difficulty breathing, headache or visual disturbances    Complete by:  As directed      Call MD for:  persistant dizziness or light-headedness    Complete by:  As directed      Call MD for:  redness, tenderness, or signs of infection (pain, swelling, redness, odor or green/yellow  discharge around incision site)    Complete by:  As directed      Diet general    Complete by:  As directed      Increase activity slowly    Complete by:  As directed           Current Discharge Medication List    START taking these medications   Details  !! feeding supplement (BOOST / RESOURCE BREEZE) LIQD Take 1 Container by mouth daily. Refills: 0    !! lactose free nutrition (BOOST PLUS) LIQD Take 237 mLs by mouth 3 (three) times daily after meals. Refills: 0    lidocaine (XYLOCAINE) 2 % solution Use as directed 15 mLs in the mouth or throat every 6 (six) hours as needed for mouth pain. Qty: 100 mL, Refills: 0    nystatin (MYCOSTATIN) 100000 UNIT/ML suspension Take 5 mLs (500,000 Units total) by mouth 4 (four) times daily. Qty: 60 mL, Refills: 0    predniSONE (DELTASONE) 20 MG tablet Take 4 tablets (80 mg total) by mouth daily with breakfast. Last day on 12/05/14 and then stop     !! - Potential duplicate medications found. Please discuss with provider.    CONTINUE these medications which have CHANGED   Details  diazepam (VALIUM) 2 MG tablet Take 1 tablet (2 mg total) by mouth every 8 (eight) hours as needed for muscle spasms. Qty: 12 tablet, Refills: 0    HYDROcodone-acetaminophen (NORCO/VICODIN) 5-325 MG tablet Take 1 tablet by mouth daily as needed. Qty: 15 tablet, Refills: 0      CONTINUE these medications which have NOT CHANGED   Details  acetaminophen (TYLENOL) 325 MG tablet Take 2 tablets (650 mg total) by mouth every 4 (four) hours as needed for headache  or mild pain.    albuterol (PROVENTIL HFA;VENTOLIN HFA) 108 (90 BASE) MCG/ACT inhaler Inhale 2 puffs into the lungs every 4 (four) hours as needed for wheezing or shortness of breath.    amiodarone (PACERONE) 200 MG tablet TAKE 1 TABLET DAILY Qty: 30 tablet, Refills: 2    aspirin 81 MG chewable tablet Chew 1 tablet (81 mg total) by mouth daily.    carbidopa-levodopa (SINEMET IR) 25-100 MG per tablet Take  0.5-1 tablets by mouth 3 (three) times daily. Qty: 90 tablet, Refills: 6    clopidogrel (PLAVIX) 75 MG tablet Take 1 tablet (75 mg total) by mouth daily. Qty: 30 tablet, Refills: 11    fexofenadine (ALLEGRA) 180 MG tablet Take 180 mg by mouth daily as needed (allergies).     furosemide (LASIX) 20 MG tablet Take 1 tablet (20 mg total) by mouth daily as needed. Daily as needed for swelling. Take two tablets daily for 14 days then return to one tablet daily Qty: 45 tablet, Refills: 3    isosorbide mononitrate (IMDUR) 60 MG 24 hr tablet Take 1 tablet (60 mg total) by mouth at bedtime. Qty: 30 tablet, Refills: 6    losartan (COZAAR) 100 MG tablet Take 100 mg by mouth daily.    nitroGLYCERIN (NITROSTAT) 0.4 MG SL tablet Place 1 tablet (0.4 mg total) under the tongue every 5 (five) minutes as needed for chest pain (Chest pain). Qty: 25 tablet, Refills: 6    pantoprazole (PROTONIX) 40 MG tablet TAKE 1 TABLET ONCE A DAY Qty: 30 tablet, Refills: 6    polyethylene glycol (MIRALAX / GLYCOLAX) packet Take 17 g by mouth daily.     ferrous sulfate 325 (65 FE) MG tablet Take 325 mg by mouth 3 (three) times daily with meals.      STOP taking these medications     amLODipine (NORVASC) 10 MG tablet      cephALEXin (KEFLEX) 500 MG capsule      clotrimazole (MYCELEX) 10 MG troche      ciprofloxacin-dexamethasone (CIPRODEX) otic suspension        Allergies  Allergen Reactions  . Ciprofloxacin Other (See Comments)    Joint pain  . Doxycycline Other (See Comments)    arthralgias  . Atorvastatin Rash  . Sulfa Drugs Cross Reactors Rash  . Zocor [Simvastatin] Rash   Follow-up Information    Follow up with CORRINGTON,KIP A, MD. Schedule an appointment as soon as possible for a visit in 2 weeks.   Specialty:  Family Medicine   Contact information:   [place] [address] [place] [address] [phone]       Follow up with [name], MD. Schedule an appointment as soon as possible  for a visit in 2 weeks.   Specialty:  Otolaryngology   Contact information:   [address] [place]  [address] [phone]        The results of significant diagnostics from this hospitalization (including imaging, microbiology, ancillary and laboratory) are listed below for reference.    Significant Diagnostic Studies: Dg Chest 2 View  11/22/2014   CLINICAL DATA:  Cough for 1 month  EXAM: CHEST  2 VIEW  COMPARISON:  November 15, 2013  FINDINGS: The heart size and mediastinal contours are within normal limits. There is left apical pleural thickening and scarring unchanged. Surgical clips are identified in the left hilum unchanged. The visualized skeletal structures are unremarkable.  IMPRESSION: No active cardiopulmonary disease.  Postsurgical changes of the  left lung unchanged. Left apical pleural thickening and scarring are unchanged.   Electronically Signed   By: Abelardo Diesel M.D.   On: 11/22/2014 22:17   Ct Head Wo Contrast  11/26/2014   CLINICAL DATA:  RIGHT facial and mouth droop, leaning to the RIGHT, generalized body weakness for 3 weeks  EXAM: CT HEAD WITHOUT CONTRAST  TECHNIQUE: Contiguous axial images were obtained from the base of the skull through the vertex without intravenous contrast.  COMPARISON:  11/10/2013  FINDINGS: Generalized atrophy.  Normal ventricular morphology.  No midline shift or mass effect.  Small vessel chronic ischemic changes of deep cerebral white matter.  No intracranial hemorrhage, mass lesion or evidence acute infarction.  No extra-axial fluid collections.  Visualized paranasal sinuses and mastoid air cells clear.  No acute osseous findings.  Atherosclerotic calcifications at the carotid siphons.  IMPRESSION: Atrophy with small vessel chronic ischemic changes of deep cerebral white matter.  No acute intracranial abnormalities.   Electronically Signed   By: Lavonia Dana M.D.   On: 11/26/2014 15:17   Mr Brain Wo Contrast  11/28/2014   CLINICAL  DATA:  Herpes infection of the right ear and throat. Right-sided facial droop.  EXAM: MRI HEAD WITHOUT CONTRAST  TECHNIQUE: Multiplanar, multiecho pulse sequences of the brain and surrounding structures were obtained without intravenous contrast.  COMPARISON:  CT head without contrast 11/26/2014.  FINDINGS: Diffusion-weighted images demonstrate no evidence for acute or subacute infarction. No acute hemorrhage or mass lesion is present. The ventricles are proportionate to the degree of atrophy. Asymmetric left periventricular white matter changes are noted. Scattered subcortical T2 changes are present bilaterally.  White matter changes extend into the brainstem. The internal auditory canals are clear bilaterally. With  Flow is present in the major intracranial arteries. Bilateral lens replacements are noted. The paranasal sinuses are clear. There is fluid in the right mastoid air cells and middle ear cavity. Minimal fluid is present in the left mastoid air cells as well.  IMPRESSION: 1. Right middle ear and mastoid effusion compatible with suggested acute infection. There is some edema along the external auditory canal mucosa as well. 2. Moderate atrophy and diffuse white matter disease. This likely reflects the sequela of chronic microvascular ischemia. 3. No acute intracranial abnormality.   Electronically Signed   By: San Morelle M.D.   On: 11/28/2014 19:36    Microbiology: Recent Results (from the past 240 hour(s))  Rapid strep screen     Status: None   Collection Time: 11/22/14  9:26 PM  Result Value Ref Range Status   Streptococcus, Group A Screen (Direct) NEGATIVE NEGATIVE Final    Comment: (NOTE) A Rapid Antigen test may result negative if the antigen level in the sample is below the detection level of this test. The FDA has not cleared this test as a stand-alone test therefore the rapid antigen negative result has reflexed to a Group A Strep culture.   Culture, Group A Strep      Status: None   Collection Time: 11/22/14  9:26 PM  Result Value Ref Range Status   Strep A Culture Negative  Final    Comment: (NOTE) Performed At: [facility] [place], [place] [id number] Lindon Romp MD PX:[id number]   Ear culture     Status: None   Collection Time: 11/24/14  1:41 PM  Result Value Ref Range Status   Specimen Description EAR  Final   Special Requests RIGHT EAR FLUID ON SWAB  Final  Culture   Final    NO GROWTH 2 DAYS Performed at [facility]    Report Status 11/27/2014 FINAL  Final     Labs: Basic Metabolic Panel:  Recent Labs Lab 11/23/14 1618 11/23/14 2049 11/24/14 0655 11/29/14 0658 11/30/14 0542  NA 133*  --  131* 132* 134*  K 3.8  --  3.5 2.8* 3.7  CL 99*  --  98* 97* 97*  CO2 27  --  '28 30 28  '$ GLUCOSE 116*  --  163* 129* 105*  BUN 9  --  10 5* 9  CREATININE 0.91  --  0.89 0.77 0.79  CALCIUM 11.2*  --  10.7* 9.9 10.9*  MG  --  2.0  --   --   --   PHOS  --  2.4*  --   --   --    Liver Function Tests:  Recent Labs Lab 11/23/14 1618  AST 36  ALT 11*  ALKPHOS 83  BILITOT 1.0  PROT 6.8  ALBUMIN 3.5   No results for input(s): LIPASE, AMYLASE in the last 168 hours. No results for input(s): AMMONIA in the last 168 hours. CBC:  Recent Labs Lab 11/23/14 1618 11/24/14 0655  WBC 5.3 3.0*  HGB 13.5 13.2  HCT 40.7 39.1  MCV 91.9 91.1  PLT 184 183   Cardiac Enzymes: No results for input(s): CKTOTAL, CKMB, CKMBINDEX, TROPONINI in the last 168 hours. BNP: BNP (last 3 results) No results for input(s): BNP in the last 8760 hours.  ProBNP (last 3 results) No results for input(s): PROBNP in the last 8760 hours.  CBG:  Recent Labs Lab 11/23/14 2152 11/24/14 0636 11/24/14 1106 11/24/14 1634  GLUCAP 166* 148* 138* 145*    Signed:  Micayla Zeltman   [facility] 11/30/2014, 10:53 AM  Attending MD note  Patient was seen, examined,treatment plan was discussed with the PA-S.  I have  personally reviewed the clinical findings, lab, imaging studies and management of this patient in detail. I agree with the documentation, as recorded by the PA-S.   81 year old gentleman admitted with failure to thrive symptoms and right ear zoster. Hospital course, complicated by development of right facial paralysis-secondary to low motor neuron involvement (Bells Palsy). Hospital course also complicated by development of significant generalized weakness and deconditioning. Slowly improving-per family-diet/appetite improving, mobility also improving. Right facial paralysis essentially unchanged. Spoke with ENT-10/7-recommended no further anti-viral treatment at this time, recommendations are to continue with prednisone for a total of 1 week. Follow-up with ENT in 2 weeks. Seen by physical therapy-recommendations are to transfer to SNF. Spoke with daughter at bedside.   [facility]

## 2014-11-30 NOTE — Care Management Important Message (Signed)
Important Message  Patient Details  Name: [name] MRN: [id number] Date of Birth: 1933/07/19   Medicare Important Message Given:  Yes-third notification given    Delorse Lek 11/30/2014, 12:02 PM

## 2014-11-30 NOTE — Clinical Social Work Placement (Signed)
   CLINICAL SOCIAL WORK PLACEMENT  NOTE  Date:  11/30/2014  Patient Details  Name: [name] MRN: [id number] Date of Birth: [birth year]  Clinical Social Work is seeking post-discharge placement for this patient at the [place] level of care (*CSW will initial, date and re-position this form in  chart as items are completed):  Yes   Patient/family provided with [facility] Work Department's list of facilities offering this level of care within the geographic area requested by the patient (or if unable, by the patient's family).  Yes   Patient/family informed of their freedom to choose among providers that offer the needed level of care, that participate in Medicare, Medicaid or managed care program needed by the patient, have an available bed and are willing to accept the patient.  Yes   Patient/family informed of [facility]'s ownership interest in [facility] and [facility], as well as of the fact that they are under no obligation to receive care at these facilities.  PASRR submitted to EDS on 11/27/14     PASRR number received on 11/27/14     Existing PASRR number confirmed on       FL2 transmitted to all facilities in geographic area requested by pt/family on 11/27/14     FL2 transmitted to all facilities within larger geographic area on       Patient informed that his/her managed care company has contracts with or will negotiate with certain facilities, including the following:        Yes   Patient/family informed of bed offers received.  Patient chooses bed at [facility]     Physician recommends and patient chooses bed at      Patient to be transferred to [facility] on 11/30/14.  Patient to be transferred to facility by [facility]      Patient family notified on 11/30/14 of transfer.  Name of family member notified:  Vella Kohler, daughter      PHYSICIAN       Additional Comment:     _______________________________________________ Greta Doom, LCSW 11/30/2014, 11:12 AM

## 2014-12-03 ENCOUNTER — Encounter: Payer: Self-pay | Admitting: Adult Health

## 2014-12-03 ENCOUNTER — Non-Acute Institutional Stay (SKILLED_NURSING_FACILITY): Payer: Medicare Other | Admitting: Adult Health

## 2014-12-03 DIAGNOSIS — R531 Weakness: Secondary | ICD-10-CM | POA: Diagnosis not present

## 2014-12-03 DIAGNOSIS — I480 Paroxysmal atrial fibrillation: Secondary | ICD-10-CM

## 2014-12-03 DIAGNOSIS — R600 Localized edema: Secondary | ICD-10-CM | POA: Diagnosis not present

## 2014-12-03 DIAGNOSIS — K5900 Constipation, unspecified: Secondary | ICD-10-CM

## 2014-12-03 DIAGNOSIS — E876 Hypokalemia: Secondary | ICD-10-CM | POA: Diagnosis not present

## 2014-12-03 DIAGNOSIS — J449 Chronic obstructive pulmonary disease, unspecified: Secondary | ICD-10-CM | POA: Diagnosis not present

## 2014-12-03 DIAGNOSIS — I10 Essential (primary) hypertension: Secondary | ICD-10-CM | POA: Diagnosis not present

## 2014-12-03 DIAGNOSIS — B370 Candidal stomatitis: Secondary | ICD-10-CM | POA: Diagnosis not present

## 2014-12-03 DIAGNOSIS — G20A1 Parkinson's disease without dyskinesia, without mention of fluctuations: Secondary | ICD-10-CM

## 2014-12-03 DIAGNOSIS — G20 Parkinson's disease: Secondary | ICD-10-CM

## 2014-12-03 DIAGNOSIS — I255 Ischemic cardiomyopathy: Secondary | ICD-10-CM

## 2014-12-03 DIAGNOSIS — G510 Bell's palsy: Secondary | ICD-10-CM

## 2014-12-03 DIAGNOSIS — B029 Zoster without complications: Secondary | ICD-10-CM

## 2014-12-03 DIAGNOSIS — J309 Allergic rhinitis, unspecified: Secondary | ICD-10-CM

## 2014-12-03 DIAGNOSIS — K219 Gastro-esophageal reflux disease without esophagitis: Secondary | ICD-10-CM

## 2014-12-03 DIAGNOSIS — R1310 Dysphagia, unspecified: Secondary | ICD-10-CM

## 2014-12-03 NOTE — Progress Notes (Signed)
Patient ID: LEELYND MALDONADO, male   DOB: 02/23/1934, 81 y.o.   MRN: [id number]    DATE:  12/03/2014 MRN:  [id number]  BIRTHDAY: [birth year]  Facility:  Nursing Home Location:  [place] Room Number: 307-P  LEVEL OF CARE:  SNF [phone])  Contact Information    Name Relation Home Work [place] Daughter [phone]   K[place]en, Gunnoe [phone]  [phone]   Hetty Ely [phone]  [phone]   Mitchel Honour   [phone]      Chief Complaint  Patient presents with  . Hospitalization Follow-up    Generalized weakness, herpes zoster, Bell's palsy, hypertension, hypokalemia, PAF, COPD, thrush, ischemic cardiomyopathy, Parkinson's disease, allergic rhinitis, edema, GERD and constipation    HISTORY OF PRESENT ILLNESS:  This is a 81 year old male was been admitted to [facility] on 10/0 716 from [facility]. He has PMH of PAF, hypertension, recent diagnosis of herpes zoster/shingles, COPD and ischemic cardiomyopathy. He was recently diagnosed with shingles of the throat and right ear. He was admitted to the hospital due to poor oral intake causing weakness secondary to shingles pain. ENT followed throughout hospitalization. He received IV Acyclovir 8 days and tetracaine eardrops with significant improvement. All visible lesions have crusted.  Hospitalization was complicated by a new onset moderate paralysis of the right side of the face (no movement of either gross's, able to partially close eyelid) with no other focal neurological deficits. This is thought to be likely secondary to herpes zoster infection (possibly [place]). CT/MRI without acute abnormality. Started on this during therapy without improvement of symptoms during hospitalization.  He has been admitted for a short-term rehabilitation.  PAST MEDICAL HISTORY:  Past Medical History  Diagnosis Date  . CAD (coronary artery  disease)     a. s/p DES-LCx 2006 b. repeat cath 2011- 90% distal LCx ISR, 100% mid RCA CTO, diffuse nonobstructive dz, medical management c. 07/2012 cath- 100% distal LCx ISR, 100% mid RCA CTO, nonobstructive dz; EF 40-45%  . PVC (premature ventricular contraction)   . HTN (hypertension)   . Paroxysmal a-fib ([place])   . PAD (peripheral artery disease) ([facility])     history of stenting in the past  . COPD (chronic obstructive pulmonary disease) ([place])   . LV dysfunction   . Anemia, iron deficiency   . Family history of anesthesia complication     "daughter gets PONV" (11/20/2013)  . Anginal pain ([place])   . Pneumonia 1-2 X's  . History of blood transfusion     "he's had several; probably related to his cancer" (11/20/2013)  . GERD (gastroesophageal reflux disease)   . H/O hiatal hernia   . History of gastric ulcer   . Arthritis     "generalized"  . Chronic lower back pain   . Anxiety   . Myocardial infarction ([place]) 09/2009; ~ 2014    [name] 10/24/2009;   . Lung cancer ([place]) dx'd 11/2005    non small cell     CURRENT MEDICATIONS: Reviewed     Medication List       This list is accurate as of: 12/03/14  3:17 PM.  Always use your most recent med list.               acetaminophen 325 MG tablet  Commonly known as:  TYLENOL  Take 2 tablets (650 mg total) by mouth every 4 (four) hours as needed for headache or mild pain.  albuterol 108 (90 BASE) MCG/ACT inhaler  Commonly known as:  PROVENTIL HFA;VENTOLIN HFA  Inhale 2 puffs into the lungs every 4 (four) hours as needed for wheezing or shortness of breath.     amiodarone 200 MG tablet  Commonly known as:  PACERONE  TAKE 1 TABLET DAILY     aspirin 81 MG chewable tablet  Chew 1 tablet (81 mg total) by mouth daily.     carbidopa-levodopa 25-100 MG tablet  Commonly known as:  SINEMET IR  Take 1 tablet by mouth 3 (three) times daily.     clopidogrel 75 MG tablet  Commonly known as:  PLAVIX  Take 1 tablet (75 mg total) by mouth  daily.     diazepam 2 MG tablet  Commonly known as:  VALIUM  Take 1 tablet (2 mg total) by mouth every 8 (eight) hours as needed for muscle spasms.     feeding supplement Liqd  Take 1 Container by mouth daily.     lactose free nutrition Liqd  Take 237 mLs by mouth 3 (three) times daily after meals.     ferrous sulfate 325 (65 FE) MG tablet  Take 325 mg by mouth 3 (three) times daily with meals.     fexofenadine 180 MG tablet  Commonly known as:  ALLEGRA  Take 180 mg by mouth daily as needed (allergies).     furosemide 20 MG tablet  Commonly known as:  LASIX  Take 20 mg by mouth daily as needed.     HYDROcodone-acetaminophen 5-325 MG tablet  Commonly known as:  NORCO/VICODIN  Take 1 tablet by mouth daily as needed.     isosorbide mononitrate 60 MG 24 hr tablet  Commonly known as:  IMDUR  Take 1 tablet (60 mg total) by mouth at bedtime.     lidocaine 2 % solution  Commonly known as:  XYLOCAINE  Use as directed 15 mLs in the mouth or throat every 6 (six) hours as needed for mouth pain.     losartan 100 MG tablet  Commonly known as:  COZAAR  Take 100 mg by mouth daily.     nitroGLYCERIN 0.4 MG SL tablet  Commonly known as:  NITROSTAT  Place 1 tablet (0.4 mg total) under the tongue every 5 (five) minutes as needed for chest pain (Chest pain).     nystatin 100000 UNIT/ML suspension  Commonly known as:  MYCOSTATIN  Take 5 mLs (500,000 Units total) by mouth 4 (four) times daily.     pantoprazole 40 MG tablet  Commonly known as:  PROTONIX  TAKE 1 TABLET ONCE A DAY     polyethylene glycol packet  Commonly known as:  MIRALAX / GLYCOLAX  Take 17 g by mouth daily.     predniSONE 20 MG tablet  Commonly known as:  DELTASONE  Take 4 tablets (80 mg total) by mouth daily with breakfast. Last day on 12/05/14 and then stop          Allergies  Allergen Reactions  . Ciprofloxacin Other (See Comments)    Joint pain  . Doxycycline Other (See Comments)    arthralgias  .  Atorvastatin Rash  . Sulfa Drugs Cross Reactors Rash  . Zocor [Simvastatin] Rash     REVIEW OF SYSTEMS:  GENERAL: no change in appetite, no fatigue, no weight changes, no fever, chills or weakness EYES: Denies change in vision, dry eyes, eye pain, itching or discharge EARS: Denies change in hearing, ringing in ears, or earache NOSE: Denies  nasal congestion or epistaxis MOUTH and THROAT: Denies oral discomfort, gingival pain or bleeding, pain from teeth or hoarseness   RESPIRATORY: no cough, SOB, DOE, wheezing, hemoptysis CARDIAC: no chest pain, edema or palpitations GI: no abdominal pain, diarrhea, constipation, heart burn, nausea or vomiting GU: Denies dysuria, frequency, hematuria, incontinence, or discharge PSYCHIATRIC: Denies feeling of depression or anxiety. No report of hallucinations, insomnia, paranoia, or agitation   PHYSICAL EXAMINATION  GENERAL APPEARANCE: Well nourished. In no acute distress. Normal body habitus SKIN:  Skin is warm and dry.  HEAD: Normal in size and contour. No evidence of trauma EYES:  No blepharitis. PERRL. Right lower eyelid droops slightly EARS: right pinnae with crusting. Patient is hard of hearing on right ear MOUTH and THROAT: Lips are without lesions. Tongue has slight brownish coating NECK: supple, trachea midline, no neck masses, no thyroid tenderness, no thyromegaly LYMPHATICS: no LAN in the neck, no supraclavicular LAN RESPIRATORY: breathing is even & unlabored, BS CTAB CARDIAC: RRR, no murmur,no extra heart sounds, no edema GI: abdomen soft, normal BS, no masses, no tenderness, no hepatomegaly, no splenomegaly EXTREMITIES:  Able to move 4 extremities NEURO:  Right facial paralysis PSYCHIATRIC: Alert and oriented X 3. Affect and behavior are appropriate  LABS/RADIOLOGY: Labs reviewed: Basic Metabolic Panel:  Recent Labs  11/23/14 2049 11/24/14 0655 11/29/14 0658 11/30/14 0542  NA  --  131* 132* 134*  K  --  3.5 2.8* 3.7  CL   --  98* 97* 97*  CO2  --  '28 30 28  '$ GLUCOSE  --  163* 129* 105*  BUN  --  10 5* 9  CREATININE  --  0.89 0.77 0.79  CALCIUM  --  10.7* 9.9 10.9*  MG 2.0  --   --   --   PHOS 2.4*  --   --   --    Liver Function Tests:  Recent Labs  01/05/14 2321 11/23/14 1618  AST 20 36  ALT 13 11*  ALKPHOS 69 83  BILITOT 0.3 1.0  PROT 7.3 6.8  ALBUMIN 3.6 3.5    Recent Labs  01/06/14 0141  LIPASE 18    CBC:  Recent Labs  01/05/14 2321 11/22/14 2235 11/23/14 1618 11/24/14 0655  WBC 11.4* 5.8 5.3 3.0*  NEUTROABS 9.2*  --   --   --   HGB 11.3* 13.4 13.5 13.2  HCT 35.3* 39.8 40.7 39.1  MCV 91.2 92.1 91.9 91.1  PLT 239 168 184 183    Cardiac Enzymes:  Recent Labs  01/04/14 1445 01/04/14 1910 01/05/14 0115  TROPONINI <0.30 <0.30 <0.30   CBG:  Recent Labs  11/24/14 0636 11/24/14 1106 11/24/14 1634  GLUCAP 148* 138* 145*    Dg Chest 2 View  11/22/2014   CLINICAL DATA:  Cough for 1 month  EXAM: CHEST  2 VIEW  COMPARISON:  November 15, 2013  FINDINGS: The heart size and mediastinal contours are within normal limits. There is left apical pleural thickening and scarring unchanged. Surgical clips are identified in the left hilum unchanged. The visualized skeletal structures are unremarkable.  IMPRESSION: No active cardiopulmonary disease.  Postsurgical changes of the left lung unchanged. Left apical pleural thickening and scarring are unchanged.   Electronically Signed   By: Abelardo Diesel M.D.   On: 11/22/2014 22:17   Ct Head Wo Contrast  11/26/2014   CLINICAL DATA:  RIGHT facial and mouth droop, leaning to the RIGHT, generalized body weakness for 3 weeks  EXAM: CT HEAD  WITHOUT CONTRAST  TECHNIQUE: Contiguous axial images were obtained from the base of the skull through the vertex without intravenous contrast.  COMPARISON:  11/10/2013  FINDINGS: Generalized atrophy.  Normal ventricular morphology.  No midline shift or mass effect.  Small vessel chronic ischemic changes of deep  cerebral white matter.  No intracranial hemorrhage, mass lesion or evidence acute infarction.  No extra-axial fluid collections.  Visualized paranasal sinuses and mastoid air cells clear.  No acute osseous findings.  Atherosclerotic calcifications at the carotid siphons.  IMPRESSION: Atrophy with small vessel chronic ischemic changes of deep cerebral white matter.  No acute intracranial abnormalities.   Electronically Signed   By: Lavonia Dana M.D.   On: 11/26/2014 15:17   Mr Brain Wo Contrast  11/28/2014   CLINICAL DATA:  Herpes infection of the right ear and throat. Right-sided facial droop.  EXAM: MRI HEAD WITHOUT CONTRAST  TECHNIQUE: Multiplanar, multiecho pulse sequences of the brain and surrounding structures were obtained without intravenous contrast.  COMPARISON:  CT head without contrast 11/26/2014.  FINDINGS: Diffusion-weighted images demonstrate no evidence for acute or subacute infarction. No acute hemorrhage or mass lesion is present. The ventricles are proportionate to the degree of atrophy. Asymmetric left periventricular white matter changes are noted. Scattered subcortical T2 changes are present bilaterally.  White matter changes extend into the brainstem. The internal auditory canals are clear bilaterally. With  Flow is present in the major intracranial arteries. Bilateral lens replacements are noted. The paranasal sinuses are clear. There is fluid in the right mastoid air cells and middle ear cavity. Minimal fluid is present in the left mastoid air cells as well.  IMPRESSION: 1. Right middle ear and mastoid effusion compatible with suggested acute infection. There is some edema along the external auditory canal mucosa as well. 2. Moderate atrophy and diffuse white matter disease. This likely reflects the sequela of chronic microvascular ischemia. 3. No acute intracranial abnormality.   Electronically Signed   By: San Morelle M.D.   On: 11/28/2014 19:36     ASSESSMENT/PLAN:  Generalized weakness - for rehabilitation; continue volume 2 mg 1 tab by mouth every 8 hours when necessary for muscle spasm, Norco 5/325 mg 1 tab by mouth daily when necessary and Tylenol 325 mg take 2 tabs = '650mg'$  by mouth every 4 hours when necessary for pain; check CBC in 1 week  Herpes zoster - continue lidocaine 2% solution 15 ML by mouth every 6 hours when necessary for pain; follow-up with Dr. Erik Obey, otolaryngology, in 2 weeks  Dysphagia - for ST evaluation and treatment; continue regular diet  Bell's palsy - continue prednisone 20 mg take 4 tabs = 80 mg by mouth daily till 10/12  Hypertension - continue Cozaar 100 mg 1 tab by mouth daily  Hypokalemia - K3.7; supplemented in the hospital; check CMP in 1 week   PAF - rate controlled; continue amiodarone 200 mg 1 tab by mouth daily, aspirin 81 mg 1 tab by mouth daily and Plavix 75 mg 1 tab by mouth daily  COPD - continue albuterol 108 gACT 2 puffs into the lungs every 4 hours when necessary  Thrush - continue nystatin 100,000 units/mL suspension take 5 mL by mouth 4 times a day 14 days  Ischemic cardiomyopathy - continue Imdur 60 mg 24 hour 1 tab by mouth daily at bedtime, NTG when necessary, Plavix 75 mg 1 tab by mouth daily and aspirin 81 mg 1 tab by mouth daily  Parkinson's disease - continue Sinemet 25-100  mg 1 tab by mouth 3 times a day  Allergic rhinitis - continue Allegra 180 mg 1 tab by mouth daily when necessary  Edema BLE - continue Lasix 20 mg 1 tab by mouth daily when necessary  GERD - continue Protonix 40 mg 1 tab by mouth daily  Constipation - continue MiraLAX 17 g by mouth daily    Goals of care:  Short-term rehabilitation     [facility], NP [place]

## 2014-12-14 ENCOUNTER — Telehealth: Payer: Self-pay | Admitting: Diagnostic Neuroimaging

## 2014-12-14 NOTE — Telephone Encounter (Signed)
Pt's daughter called sts pt has shingles x 3wks  - ENT will send referral - sts he should see neurologist-shingles in rt eye, ear, throat and also has Bell's Palsy. Eye has been effected, he will have surgery to close lid as he can not blink now. Pain is intense. Is it ok to schedule him with him another provider as Dr Leta Baptist does not have availability for 1 week. Please call and advise the at [phone]

## 2014-12-17 NOTE — Telephone Encounter (Signed)
Ok to see me later this week. -VRP

## 2014-12-18 ENCOUNTER — Ambulatory Visit: Payer: Medicare Other | Admitting: Neurology

## 2014-12-18 NOTE — Telephone Encounter (Signed)
Pt's daughter, Drucilla Chalet and wanted to let RN know that this Thursdays appt is good. If you need to change anything to please call her. Thank you . May call [phone]

## 2014-12-18 NOTE — Telephone Encounter (Signed)
Left vm for daughter, Janace Hoard informing her that Dr Leta Baptist will see her father this week or very early next week. Advised her that Dr Leta Baptist is out of office this Friday. Requested she call back to schedule him for FU. Left this caller's name, number.

## 2014-12-19 ENCOUNTER — Encounter: Payer: Self-pay | Admitting: Adult Health

## 2014-12-19 ENCOUNTER — Non-Acute Institutional Stay (SKILLED_NURSING_FACILITY): Payer: Medicare Other | Admitting: Adult Health

## 2014-12-19 DIAGNOSIS — J309 Allergic rhinitis, unspecified: Secondary | ICD-10-CM | POA: Diagnosis not present

## 2014-12-19 DIAGNOSIS — G20 Parkinson's disease: Secondary | ICD-10-CM | POA: Diagnosis not present

## 2014-12-19 DIAGNOSIS — R531 Weakness: Secondary | ICD-10-CM | POA: Diagnosis not present

## 2014-12-19 DIAGNOSIS — R600 Localized edema: Secondary | ICD-10-CM | POA: Diagnosis not present

## 2014-12-19 DIAGNOSIS — I480 Paroxysmal atrial fibrillation: Secondary | ICD-10-CM | POA: Diagnosis not present

## 2014-12-19 DIAGNOSIS — I255 Ischemic cardiomyopathy: Secondary | ICD-10-CM | POA: Diagnosis not present

## 2014-12-19 DIAGNOSIS — I10 Essential (primary) hypertension: Secondary | ICD-10-CM

## 2014-12-19 DIAGNOSIS — G510 Bell's palsy: Secondary | ICD-10-CM | POA: Diagnosis not present

## 2014-12-19 DIAGNOSIS — B029 Zoster without complications: Secondary | ICD-10-CM

## 2014-12-19 DIAGNOSIS — K5900 Constipation, unspecified: Secondary | ICD-10-CM | POA: Diagnosis not present

## 2014-12-19 DIAGNOSIS — R1310 Dysphagia, unspecified: Secondary | ICD-10-CM

## 2014-12-19 DIAGNOSIS — J449 Chronic obstructive pulmonary disease, unspecified: Secondary | ICD-10-CM

## 2014-12-19 DIAGNOSIS — K219 Gastro-esophageal reflux disease without esophagitis: Secondary | ICD-10-CM | POA: Diagnosis not present

## 2014-12-19 NOTE — Progress Notes (Signed)
Patient ID: [name], male   DOB: [birth year], 81 y.o.   MRN: [id number]    DATE:  12/19/14   MRN:  [id number]  BIRTHDAY: [birth year]  Facility:  Nursing Home Location:  [place] Room Number: 307-P  LEVEL OF CARE:  SNF [phone])      Contact Information    Name Relation Home Work [place] Daughter [phone]   Clary, Meeker [phone]  [phone]   Hetty Ely [phone]  [phone]   Mitchel Honour   [phone]     Chief Complaint  Patient presents with  . Discharge Note    Generalized weakness, herpes zoster, Bell's palsy, hypertension, PAF, COPD, thrush, ischemic cardiomyopathy, Parkinson's disease, allergic rhinitis, edema, GERD and constipation    HISTORY OF PRESENT ILLNESS:  This is a 81 year old male who is for discharge home with home health PT, OT, CNA and speech therapy. He has been admitted to [facility] on 11/714 from [facility]. He has PMH of PAF, hypertension, recent diagnosis of herpes zoster/shingles, COPD and ischemic cardiomyopathy. He was recently diagnosed with shingles of the throat and right ear. He was admitted to the hospital due to poor oral intake causing weakness secondary to shingles pain. ENT followed throughout hospitalization. He received IV Acyclovir 8 days and tetracaine eardrops with significant improvement. All visible lesions have crusted.  Hospitalization was complicated by a new onset moderate paralysis of the right side of the face (no movement of eyebrows, able to partially close eyelid) with no other focal neurological deficits. This is thought to be likely secondary to herpes zoster infection (possibly [place]). CT/MRI without acute abnormality. Started on steroid therapy without improvement of symptoms during hospitalization. Steroid given till 12/05/14.  He had temporary tarsorrhaphy on his right eye and erythromycin  ointment applied @ bedtime. He  Had ENT follow-up on 12/13/14  Patient was admitted to this facility for short-term rehabilitation after the patient's recent hospitalization.  Patient has completed SNF rehabilitation and therapy has cleared the patient for discharge.   PAST MEDICAL HISTORY:  Past Medical History  Diagnosis Date  . CAD (coronary artery disease)     a. s/p DES-LCx 2006 b. repeat cath 2011- 90% distal LCx ISR, 100% mid RCA CTO, diffuse nonobstructive dz, medical management c. 07/2012 cath- 100% distal LCx ISR, 100% mid RCA CTO, nonobstructive dz; EF 40-45%  . PVC (premature ventricular contraction)   . HTN (hypertension)   . Paroxysmal a-fib ([place])   . PAD (peripheral artery disease) ([facility])     history of stenting in the past  . COPD (chronic obstructive pulmonary disease) ([place])   . LV dysfunction   . Anemia, iron deficiency   . Family history of anesthesia complication     "daughter gets PONV" (11/20/2013)  . Anginal pain ([place])   . Pneumonia 1-2 X's  . History of blood transfusion     "he's had several; probably related to his cancer" (11/20/2013)  . GERD (gastroesophageal reflux disease)   . H/O hiatal hernia   . History of gastric ulcer   . Arthritis     "generalized"  . Chronic lower back pain   . Anxiety   . Myocardial infarction ([place]) 09/2009; ~ 2014    [name] 10/24/2009;   . Lung cancer ([place]) dx'd 11/2005    non small cell     CURRENT MEDICATIONS: Reviewed     Medication List  This list is accurate as of: 12/19/14 12:41 PM.  Always use your most recent med list.               acetaminophen 325 MG tablet  Commonly known as:  TYLENOL  Take 2 tablets (650 mg total) by mouth every 4 (four) hours as needed for headache or mild pain.     albuterol (2.5 MG/3ML) 0.083% nebulizer solution  Commonly known as:  PROVENTIL  Take 2.5 mg by nebulization every 4 (four) hours as needed for wheezing or shortness of breath.     amiodarone 200 MG tablet    Commonly known as:  PACERONE  TAKE 1 TABLET DAILY     aspirin 81 MG chewable tablet  Chew 1 tablet (81 mg total) by mouth daily.     carbidopa-levodopa 25-100 MG tablet  Commonly known as:  SINEMET IR  Take 1 tablet by mouth 3 (three) times daily.     clopidogrel 75 MG tablet  Commonly known as:  PLAVIX  Take 1 tablet (75 mg total) by mouth daily.     diazepam 2 MG tablet  Commonly known as:  VALIUM  Take 1 tablet (2 mg total) by mouth every 8 (eight) hours as needed for muscle spasms.     erythromycin ophthalmic ointment  Place 1 application into the right eye 3 (three) times daily.     feeding supplement Liqd  Take 1 Container by mouth daily.     lactose free nutrition Liqd  Take 237 mLs by mouth 3 (three) times daily after meals.     ferrous sulfate 325 (65 FE) MG tablet  Take 325 mg by mouth 3 (three) times daily with meals.     fexofenadine 180 MG tablet  Commonly known as:  ALLEGRA  Take 180 mg by mouth daily as needed (allergies).     furosemide 20 MG tablet  Commonly known as:  LASIX  Take 20 mg by mouth daily as needed.     HYDROcodone-acetaminophen 5-325 MG tablet  Commonly known as:  NORCO/VICODIN  Take 1 tablet by mouth 2 (two) times daily as needed for moderate pain.     isosorbide mononitrate 60 MG 24 hr tablet  Commonly known as:  IMDUR  Take 1 tablet (60 mg total) by mouth at bedtime.     lidocaine 2 % solution  Commonly known as:  XYLOCAINE  Use as directed 15 mLs in the mouth or throat every 6 (six) hours as needed for mouth pain.     losartan 100 MG tablet  Commonly known as:  COZAAR  Take 100 mg by mouth daily.     nitroGLYCERIN 0.4 MG SL tablet  Commonly known as:  NITROSTAT  Place 1 tablet (0.4 mg total) under the tongue every 5 (five) minutes as needed for chest pain (Chest pain).     nystatin 100000 UNIT/ML suspension  Commonly known as:  MYCOSTATIN  Take 5 mLs (500,000 Units total) by mouth 4 (four) times daily.     pantoprazole  40 MG tablet  Commonly known as:  PROTONIX  TAKE 1 TABLET ONCE A DAY     polyethylene glycol packet  Commonly known as:  MIRALAX / GLYCOLAX  Take 17 g by mouth daily.          Allergies  Allergen Reactions  . Ciprofloxacin Other (See Comments)    Joint pain  . Doxycycline Other (See Comments)    arthralgias  . Atorvastatin Rash  . Sulfa Drugs  Cross Reactors Rash  . Zocor [Simvastatin] Rash     REVIEW OF SYSTEMS:  GENERAL: no change in appetite, no fatigue, no weight changes, no fever, chills or weakness EYES: Denies change in vision, dry eyes, eye pain, itching or discharge EARS: Denies change in hearing, ringing in ears, or earache NOSE: Denies nasal congestion or epistaxis MOUTH and THROAT: Denies oral discomfort, gingival pain or bleeding, pain from teeth or hoarseness   RESPIRATORY: no cough, SOB, DOE, wheezing, hemoptysis CARDIAC: no chest pain, edema or palpitations GI: no abdominal pain, diarrhea, constipation, heart burn, nausea or vomiting GU: Denies dysuria, frequency, hematuria, incontinence, or discharge PSYCHIATRIC: Denies feeling of depression or anxiety. No report of hallucinations, insomnia, paranoia, or agitation   PHYSICAL EXAMINATION  GENERAL APPEARANCE: Well nourished. In no acute distress. Normal body habitus SKIN:  Skin is warm and dry.  HEAD: Normal in size and contour. No evidence of trauma EYES:  Had temporary tarsorrhaphy of right eye EARS: right pinnae with crusting. Patient is hard of hearing on right ear MOUTH and THROAT: Lips are without lesions. Tongue has slight brownish coating NECK: supple, trachea midline, no neck masses, no thyroid tenderness, no thyromegaly LYMPHATICS: no LAN in the neck, no supraclavicular LAN RESPIRATORY: breathing is even & unlabored, BS CTAB CARDIAC: RRR, no murmur,no extra heart sounds, no edema GI: abdomen soft, normal BS, no masses, no tenderness, no hepatomegaly, no splenomegaly EXTREMITIES:  Able to  move 4 extremities NEURO:  Right facial paralysis PSYCHIATRIC: Alert and oriented X 3. Affect and behavior are appropriate  LABS/RADIOLOGY: Labs reviewed: Basic Metabolic Panel:  Recent Labs  11/23/14 2049 11/24/14 0655 11/29/14 0658 11/30/14 0542  NA  --  131* 132* 134*  K  --  3.5 2.8* 3.7  CL  --  98* 97* 97*  CO2  --  '28 30 28  '$ GLUCOSE  --  163* 129* 105*  BUN  --  10 5* 9  CREATININE  --  0.89 0.77 0.79  CALCIUM  --  10.7* 9.9 10.9*  MG 2.0  --   --   --   PHOS 2.4*  --   --   --    Liver Function Tests:  Recent Labs  01/05/14 2321 11/23/14 1618  AST 20 36  ALT 13 11*  ALKPHOS 69 83  BILITOT 0.3 1.0  PROT 7.3 6.8  ALBUMIN 3.6 3.5    Recent Labs  01/06/14 0141  LIPASE 18    CBC:  Recent Labs  01/05/14 2321 11/22/14 2235 11/23/14 1618 11/24/14 0655  WBC 11.4* 5.8 5.3 3.0*  NEUTROABS 9.2*  --   --   --   HGB 11.3* 13.4 13.5 13.2  HCT 35.3* 39.8 40.7 39.1  MCV 91.2 92.1 91.9 91.1  PLT 239 168 184 183    Cardiac Enzymes:  Recent Labs  01/04/14 1445 01/04/14 1910 01/05/14 0115  TROPONINI <0.30 <0.30 <0.30   CBG:  Recent Labs  11/24/14 0636 11/24/14 1106 11/24/14 1634  GLUCAP 148* 138* 145*    Dg Chest 2 View  11/22/2014  CLINICAL DATA:  Cough for 1 month EXAM: CHEST  2 VIEW COMPARISON:  November 15, 2013 FINDINGS: The heart size and mediastinal contours are within normal limits. There is left apical pleural thickening and scarring unchanged. Surgical clips are identified in the left hilum unchanged. The visualized skeletal structures are unremarkable. IMPRESSION: No active cardiopulmonary disease. Postsurgical changes of the left lung unchanged. Left apical pleural thickening and scarring are unchanged. Electronically  Signed   By: Abelardo Diesel M.D.   On: 11/22/2014 22:17   Ct Head Wo Contrast  11/26/2014  CLINICAL DATA:  RIGHT facial and mouth droop, leaning to the RIGHT, generalized body weakness for 3 weeks EXAM: CT HEAD WITHOUT  CONTRAST TECHNIQUE: Contiguous axial images were obtained from the base of the skull through the vertex without intravenous contrast. COMPARISON:  11/10/2013 FINDINGS: Generalized atrophy. Normal ventricular morphology. No midline shift or mass effect. Small vessel chronic ischemic changes of deep cerebral white matter. No intracranial hemorrhage, mass lesion or evidence acute infarction. No extra-axial fluid collections. Visualized paranasal sinuses and mastoid air cells clear. No acute osseous findings. Atherosclerotic calcifications at the carotid siphons. IMPRESSION: Atrophy with small vessel chronic ischemic changes of deep cerebral white matter. No acute intracranial abnormalities. Electronically Signed   By: Lavonia Dana M.D.   On: 11/26/2014 15:17   Mr Brain Wo Contrast  11/28/2014  CLINICAL DATA:  Herpes infection of the right ear and throat. Right-sided facial droop. EXAM: MRI HEAD WITHOUT CONTRAST TECHNIQUE: Multiplanar, multiecho pulse sequences of the brain and surrounding structures were obtained without intravenous contrast. COMPARISON:  CT head without contrast 11/26/2014. FINDINGS: Diffusion-weighted images demonstrate no evidence for acute or subacute infarction. No acute hemorrhage or mass lesion is present. The ventricles are proportionate to the degree of atrophy. Asymmetric left periventricular white matter changes are noted. Scattered subcortical T2 changes are present bilaterally. White matter changes extend into the brainstem. The internal auditory canals are clear bilaterally. With Flow is present in the major intracranial arteries. Bilateral lens replacements are noted. The paranasal sinuses are clear. There is fluid in the right mastoid air cells and middle ear cavity. Minimal fluid is present in the left mastoid air cells as well. IMPRESSION: 1. Right middle ear and mastoid effusion compatible with suggested acute infection. There is some edema along the external auditory canal mucosa  as well. 2. Moderate atrophy and diffuse white matter disease. This likely reflects the sequela of chronic microvascular ischemia. 3. No acute intracranial abnormality. Electronically Signed   By: San Morelle M.D.   On: 11/28/2014 19:36    ASSESSMENT/PLAN:  Generalized weakness - for Home health PT, OT, CNA and Speech Therapy; continue Valium 2 mg 1 tab by mouth every 8 hours when necessary for muscle spasm, Norco 5/325 mg 1 tab by mouth daily when necessary and Tylenol 325 mg take 2 tabs = '650mg'$  by mouth every 4 hours when necessary for pain  Herpes zoster - continue lidocaine 2% solution 15 ML by mouth every 6 hours when necessary for pain; follow-up with Dr. Erik Obey, otolaryngology, in 1 month  Dysphagia - for Home health Speech therapy; continue regular diet  Bell's palsy - follow-up with neurology  Hypertension - continue Cozaar 100 mg 1 tab by mouth daily   PAF - rate controlled; continue amiodarone 200 mg 1 tab by mouth daily, aspirin 81 mg 1 tab by mouth daily and Plavix 75 mg 1 tab by mouth daily  COPD - continue albuterol 2.5 mg/43m 1 neb Q 4 hours PRN  Ischemic cardiomyopathy - continue Imdur 60 mg 24 hour 1 tab by mouth daily at bedtime, NTG when necessary, Plavix 75 mg 1 tab by mouth daily and aspirin 81 mg 1 tab by mouth daily  Parkinson's disease - continue Sinemet 25-100 mg 1 tab by mouth 3 times a day  Allergic rhinitis - continue Allegra 180 mg 1 tab by mouth daily when necessary  Edema BLE -  continue Lasix 20 mg 1 tab by mouth daily when necessary  GERD - continue Protonix 40 mg 1 tab by mouth daily  Constipation - continue MiraLAX 17 g by mouth daily     I have filled out patient's discharge paperwork and written prescriptions.  Patient will receive home health PT, OT, ST and CNA.  Total discharge time: Less than 30 minutes  Discharge time involved coordination of the discharge process with Education officer, museum, nursing staff and therapy department. Medical  justification for home health services verified.      [facility], NP [employer] [phone]

## 2014-12-20 ENCOUNTER — Encounter: Payer: Self-pay | Admitting: *Deleted

## 2014-12-20 ENCOUNTER — Ambulatory Visit (INDEPENDENT_AMBULATORY_CARE_PROVIDER_SITE_OTHER): Payer: Medicare Other | Admitting: Diagnostic Neuroimaging

## 2014-12-20 VITALS — BP 125/67 | HR 57 | Ht 75.0 in | Wt 189.0 lb

## 2014-12-20 DIAGNOSIS — B0221 Postherpetic geniculate ganglionitis: Secondary | ICD-10-CM | POA: Diagnosis not present

## 2014-12-20 NOTE — Progress Notes (Signed)
[facility]  PATIENT: [name] DOB: 11/19/1933  REFERRING CLINICIAN: Hongalgi HISTORY FROM: patient  REASON FOR VISIT: follow up    HISTORICAL  CHIEF COMPLAINT:  Chief Complaint  Patient presents with  . Facial weakness, post herpetic neuralgia    rm 7, dgtr- [name]    HISTORY OF PRESENT ILLNESS:   UPDATE 12/20/14: Since last visit, had complicated course with right sided CN7/8 shingles infection starting in Sept 2016, requiring hospital stay and IV acyclovir. Then went to rehab, and now back home. He is not sure if carb/levo was helping tremor. Living alone. Memory poor.   UPDATE 01/24/14: Since last visit, still has "dizziness": swimmy headed, lightheaded, balance issues, staggering. Had stent to left leg. Planning to have stent to right leg. No very active. Stay at home most of the time. Tremor with handwriting and drinking very difficult at times.  PRIOR HPI (11/14/13): 81 year old right handed male with hypertension, heart disease, lung cancer (s/p paclitaxel and carboplatin 2007-8), COPD, emphysema, here for evaluation of gait difficulty and tremors. 3 years ago patient had onset of "staggering" and unsteady gait. This has been gradual and progressive. In addition he has had intermittent episodes of spinning sensation associated with nausea and vomiting, lasting 1-2 minutes at a time. These are typically triggered by changing head position, moving from sitting to standing or standing to lying down. Patient also has had progressive hearing loss over the past 3 years. Patient has developed tremors in his hands, especially with handwriting, using utensils, drinking from a cup. No resting tremor. Patient also has cold sensitivity in his fingers, with color change to purple and then white if he has prolonged exposure.   REVIEW OF SYSTEMS: Full 14 system review of systems performed and notable only for fatigue leg swelling easy bruising aching  muscles.   ALLERGIES: Allergies  Allergen Reactions  . Ciprofloxacin Other (See Comments)    Joint pain  . Doxycycline Other (See Comments)    arthralgias  . Atorvastatin Rash  . Sulfa Drugs Cross Reactors Rash  . Zocor [Simvastatin] Rash    HOME MEDICATIONS: Outpatient Prescriptions Prior to Visit  Medication Sig Dispense Refill  . acetaminophen (TYLENOL) 325 MG tablet Take 2 tablets (650 mg total) by mouth every 4 (four) hours as needed for headache or mild pain.    Marland Kitchen albuterol (PROVENTIL) (2.5 MG/3ML) 0.083% nebulizer solution Take 2.5 mg by nebulization every 4 (four) hours as needed for wheezing or shortness of breath.    Marland Kitchen amiodarone (PACERONE) 200 MG tablet TAKE 1 TABLET DAILY 30 tablet 2  . aspirin 81 MG chewable tablet Chew 1 tablet (81 mg total) by mouth daily.    . carbidopa-levodopa (SINEMET IR) 25-100 MG tablet Take 1 tablet by mouth 3 (three) times daily.    . clopidogrel (PLAVIX) 75 MG tablet Take 1 tablet (75 mg total) by mouth daily. 30 tablet 11  . diazepam (VALIUM) 2 MG tablet Take 1 tablet (2 mg total) by mouth every 8 (eight) hours as needed for muscle spasms. 12 tablet 0  . erythromycin ophthalmic ointment Place 1 application into the right eye 3 (three) times daily.    . feeding supplement (BOOST / RESOURCE BREEZE) LIQD Take 1 Container by mouth daily.  0  . ferrous sulfate 325 (65 FE) MG tablet Take 325 mg by mouth 3 (three) times daily with meals.    . fexofenadine (ALLEGRA) 180 MG tablet Take 180 mg by mouth daily as needed (allergies).     Marland Kitchen  furosemide (LASIX) 20 MG tablet Take 20 mg by mouth daily as needed.    Marland Kitchen HYDROcodone-acetaminophen (NORCO/VICODIN) 5-325 MG tablet Take 1 tablet by mouth 2 (two) times daily as needed for moderate pain.    . isosorbide mononitrate (IMDUR) 60 MG 24 hr tablet Take 1 tablet (60 mg total) by mouth at bedtime. 30 tablet 6  . lactose free nutrition (BOOST PLUS) LIQD Take 237 mLs by mouth 3 (three) times daily after meals.  0   . lidocaine (XYLOCAINE) 2 % solution Use as directed 15 mLs in the mouth or throat every 6 (six) hours as needed for mouth pain. 100 mL 0  . losartan (COZAAR) 100 MG tablet Take 100 mg by mouth daily.    . nitroGLYCERIN (NITROSTAT) 0.4 MG SL tablet Place 1 tablet (0.4 mg total) under the tongue every 5 (five) minutes as needed for chest pain (Chest pain). 25 tablet 6  . nystatin (MYCOSTATIN) 100000 UNIT/ML suspension Take 5 mLs (500,000 Units total) by mouth 4 (four) times daily. (Patient taking differently: Take 5 mLs by mouth. ) 60 mL 0  . pantoprazole (PROTONIX) 40 MG tablet TAKE 1 TABLET ONCE A DAY 30 tablet 6  . polyethylene glycol (MIRALAX / GLYCOLAX) packet Take 17 g by mouth daily.      No facility-administered medications prior to visit.    PAST MEDICAL HISTORY: Past Medical History  Diagnosis Date  . CAD (coronary artery disease)     a. s/p DES-LCx 2006 b. repeat cath 2011- 90% distal LCx ISR, 100% mid RCA CTO, diffuse nonobstructive dz, medical management c. 07/2012 cath- 100% distal LCx ISR, 100% mid RCA CTO, nonobstructive dz; EF 40-45%  . PVC (premature ventricular contraction)   . HTN (hypertension)   . Paroxysmal a-fib ([place])   . PAD (peripheral artery disease) ([facility])     history of stenting in the past  . COPD (chronic obstructive pulmonary disease) ([place])   . LV dysfunction   . Anemia, iron deficiency   . Family history of anesthesia complication     "daughter gets PONV" (11/20/2013)  . Anginal pain ([place])   . Pneumonia 1-2 X's  . History of blood transfusion     "he's had several; probably related to his cancer" (11/20/2013)  . GERD (gastroesophageal reflux disease)   . H/O hiatal hernia   . History of gastric ulcer   . Arthritis     "generalized"  . Chronic lower back pain   . Anxiety   . Myocardial infarction ([place]) 09/2009; ~ 2014    [name] 10/24/2009;   . Lung cancer ([place]) dx'd 11/2005    non small cell    PAST SURGICAL HISTORY: Past Surgical History   Procedure Laterality Date  . Lobectomy Left     left upper  . Appendectomy    . Anterior cervical decomp/discectomy fusion    . Twenty four hour intraesophageal ph profile.  05/02/2001  . Aortogram with bilateral lower extremity runoff.  07/11/2003  . Fiberoptic bronchoscopy, mediastinum.  10/20/2005  . Video assisted thoracoscopy Left 12/01/2005  . Esophagogastroduodenoscopy with biopsy.  04/14/2010  . Iliac artery stent Right 11/20/2013  . Tonsillectomy  1972  . Inguinal hernia repair Bilateral     "I've had several"  . Back surgery      "I've had several back surgeries; got spacers down in lower back" (11/20/2013)  . Cataract extraction w/ intraocular lens  implant, bilateral Bilateral   . Coronary angioplasty with stent placement  2006  DES to LCX/notes 01/04/2014  . Cardiac catheterization  2011; 07/2012    [name] 01/04/2014  . Femoral artery stent Left 01/04/2014  . Left heart catheterization with coronary angiogram N/A 08/19/2012    Procedure: LEFT HEART CATHETERIZATION WITH CORONARY ANGIOGRAM;  Surgeon: Burnell Blanks, MD;  Location: [facility] CATH LAB;  Service: Cardiovascular;  Laterality: N/A;  . Lower extremity angiogram N/A 11/20/2013    Procedure: LOWER EXTREMITY ANGIOGRAM;  Surgeon: Lorretta Harp, MD;  Location: [facility] CATH LAB;  Service: Cardiovascular;  Laterality: N/A;  . Lower extremity angiogram N/A 01/04/2014    Procedure: LOWER EXTREMITY ANGIOGRAM;  Surgeon: Lorretta Harp, MD;  Location: [facility] C[facility] LAB;  Service: Cardiovascular;  Laterality: N/A;    FAMILY HISTORY: Family History  Problem Relation Age of Onset  . Heart Problems Mother   . Breast cancer Mother   . Other Father     Motorcycle Accident  . Breast cancer Sister     SOCIAL HISTORY:  Social History   Social History  . Marital Status: Widowed    Spouse Name: N/A  . Number of Children: 2  . Years of Education: 12th   Occupational History  . Retired     Nutritional therapist   Social History Main  Topics  . Smoking status: Former Smoker -- 1.00 packs/day for 65 years    Types: Cigarettes    Quit date: 11/20/2014  . Smokeless tobacco: Never Used  . Alcohol Use: Yes     Comment: quit 40+ years ago  . Drug Use: No  . Sexual Activity: No   Other Topics Concern  . Not on file   Social History Narrative   Patient lives at home alone.   Caffeine Use: several cups of coffee daily     PHYSICAL EXAM  Filed Vitals:   12/20/14 1542  BP: 125/67  Pulse: 57  Height: '6\' 3"'$  (1.905 m)  Weight: 189 lb (85.73 kg)    Not recorded      Body mass index is 23.62 kg/(m^2).  GENERAL EXAM: Patient is in no distress; well developed, nourished and groomed; neck is supple  CARDIOVASCULAR: Regular rate and rhythm, no murmurs, no carotid bruits  NEUROLOGIC: MENTAL STATUS: awake, alert, language fluent, comprehension intact, naming intact, fund of knowledge appropriate CRANIAL NERVE: pupils equal and reactive to light, visual fields full to confrontation, extraocular muscles intact, no nystagmus, facial sensation symmetric, FACIAL STRENGTH --> UPPER AND LOWER FACIAL WEAKNESS; DECR HEARING; palate elevates symmetrically, uvula midline, shoulder shrug symmetric, tongue midline. HOARSE VOICE; MILD VOICE TREMOR.  MOTOR: MILD POSTURAL TREMOR IN BUE; MILD RESTING TREMOR IN BUE; MILD COGWHEELING AND BRADYKINESIA IN BUE; normal bulk and tone, full strength in the BUE; BLE (HF 5, KE 5, KF 5, DF 3) SENSORY: ABSENT VIB AT ANKLES AND TOES COORDINATION: finger-nose-finger, fine finger movements normal REFLEXES: BUE 1, BLE 0, DOWN GOING TOES GAIT/STATION: IN WHEELCHAIR    DIAGNOSTIC DATA (LABS, IMAGING, TESTING) - I reviewed patient records, labs, notes, testing and imaging myself where available.  Lab Results  Component Value Date   WBC 3.0* 11/24/2014   HGB 13.2 11/24/2014   HCT 39.1 11/24/2014   MCV 91.1 11/24/2014   PLT 183 11/24/2014      Component Value Date/Time   NA 134* 11/30/2014  0542   NA 136 12/20/2012 1520   K 3.7 11/30/2014 0542   K 4.4 12/20/2012 1520   CL 97* 11/30/2014 0542   CO2 28 11/30/2014 0542   CO2 26 12/20/2012  1520   GLUCOSE 105* 11/30/2014 0542   GLUCOSE 98 12/20/2012 1520   BUN 9 11/30/2014 0542   BUN 10.1 12/20/2012 1520   CREATININE 0.79 11/30/2014 0542   CREATININE 0.75 12/28/2013 1152   CREATININE 1.1 12/20/2012 1520   CALCIUM 10.9* 11/30/2014 0542   CALCIUM 9.9 12/20/2012 1520   PROT 6.8 11/23/2014 1618   PROT 7.0 12/20/2012 1520   ALBUMIN 3.5 11/23/2014 1618   ALBUMIN 3.3* 12/20/2012 1520   AST 36 11/23/2014 1618   AST 13 12/20/2012 1520   ALT 11* 11/23/2014 1618   ALT 10 12/20/2012 1520   ALKPHOS 83 11/23/2014 1618   ALKPHOS 67 12/20/2012 1520   BILITOT 1.0 11/23/2014 1618   BILITOT 0.38 12/20/2012 1520   GFRNONAA >60 11/30/2014 0542   GFRAA >60 11/30/2014 0542   Lab Results  Component Value Date   CHOL 166 06/16/2011   HDL 61.30 06/16/2011   LDLCALC 91 06/16/2011   TRIG 70.0 06/16/2011   CHOLHDL 3 06/16/2011   Lab Results  Component Value Date   HGBA1C 6.0* 11/23/2014   Lab Results  Component Value Date   VITAMINB12 433 11/15/2013   Lab Results  Component Value Date   TSH 2.149 11/15/2013   TSH 1.879 11/15/2013    11/10/13 CT head  1. No acute intracranial pathology seen on CT.  2. Mild to moderate cortical volume loss. Significant cerebellar atrophy again noted. Scattered small vessel ischemic microangiopathy.  09/12/13 carotid u/s - mild fibrous plaque with no stenosis  09/12/13 BLE arterial u/s - right common iliac artery: 70-99% reduction - left common iliac artery: equal or greater than 50% reduction - bilateral superficial femoral artery: demonstrated occlusive disease with reconstitution within the distal segment - bilateral runoff: posterior tibial arteries occluded  12/06/13 MRI brain and IAC (with and without) demonstrating: 1. Mild periventricular and subcortical and pontine chronic small  vessel ischemic disease.  2. Mega cisterna magna vs moderate cerebellar atrophy. 3. Thin cut views of the internal auditory canals, semicircular canals and brain stem structures are unremarkable. No compressive or abnormal enhancing lesions noted.  4. No change from MRI on 09/25/09.  12/06/13 MRI cervical spine (without) demonstrating: 1. At C4-5: left facet hypertrophy with severe left foraminal stenosis. 2. At C5-6: uncovertebral joint hypertrophy and facet hypertrophy with moderate biforaminal stenosis. 3. Anterior cervical fusion at C4-C6 with metal hardware.  12/06/13 MRI lumbar spine (without) demonstrating: 1. At L4-5, L5-S1: bilateral facetectomies with interbody cages, with moderate biforaminal stenosis. 2. At L3-4: disc bulging and facet hypertrophy with mild biforaminal stenosis. 3. Degenerative endplate disease with marrow edema at L3 inferiorly (with schmorl's node) and L4 antero-superiorly. 4. Anterior spondylosis and bone bridging from T12-L1 to L4-5.   11/28/14 MRI brain [I reviewed images myself and agree with interpretation. -VRP]  1. Right middle ear and mastoid effusion compatible with suggested acute infection. There is some edema along the external auditory canal mucosa as well. 2. Moderate atrophy and diffuse white matter disease. This likely reflects the sequela of chronic microvascular ischemia. 3. No acute intracranial abnormality.   ASSESSMENT AND PLAN  81 y.o. year old male here with progressive "dizziness", which consists of several symptoms: staggering gait, lightheaded feeling, decreased coordination, balance diff. Also with increased tone in upper ext, postural and action tremor in upper ext, cogwheel rigidity, bradykinesia, low back pain, bilateral foot drop, peripheral arterial disease. No clear unifying diagnosis. I suspect that several factors are contributing.   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 Still with significant right ear  pain and dizziness. Hopefully this will improve with time. However, of more concern is his home living situation, and daughter is looking into getting more help for him at his home.  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    Penni Bombard, MD 62/44/6950, 7:22 PM Certified in Neurology, Neurophysiology and Neuroimaging  [facility] [address] [place], [place] [address] [phone]

## 2014-12-20 NOTE — Patient Instructions (Signed)
Taper off carb/levo; reduce by 1 pill per day every week.

## 2015-01-05 ENCOUNTER — Other Ambulatory Visit: Payer: Self-pay | Admitting: Cardiology

## 2015-01-16 ENCOUNTER — Other Ambulatory Visit (HOSPITAL_BASED_OUTPATIENT_CLINIC_OR_DEPARTMENT_OTHER): Payer: Medicare Other

## 2015-01-16 ENCOUNTER — Ambulatory Visit (HOSPITAL_COMMUNITY)
Admission: RE | Admit: 2015-01-16 | Discharge: 2015-01-16 | Disposition: A | Discharge status: Home / Self Care | Payer: Medicare Other | Source: Ambulatory Visit | Attending: Internal Medicine | Admitting: Internal Medicine

## 2015-01-16 ENCOUNTER — Encounter (HOSPITAL_COMMUNITY): Payer: Self-pay

## 2015-01-16 DIAGNOSIS — Z85118 Personal history of other malignant neoplasm of bronchus and lung: Secondary | ICD-10-CM

## 2015-01-16 DIAGNOSIS — C3412 Malignant neoplasm of upper lobe, left bronchus or lung: Secondary | ICD-10-CM | POA: Insufficient documentation

## 2015-01-16 DIAGNOSIS — M8580 Other specified disorders of bone density and structure, unspecified site: Secondary | ICD-10-CM | POA: Insufficient documentation

## 2015-01-16 LAB — COMPREHENSIVE METABOLIC PANEL (CC13)
ALT: 43 U/L (ref 0–55)
AST: 63 U/L — ABNORMAL HIGH (ref 5–34)
Albumin: 3.4 g/dL — ABNORMAL LOW (ref 3.5–5.0)
Alkaline Phosphatase: 88 U/L (ref 40–150)
Anion Gap: 7 mEq/L (ref 3–11)
BUN: 11.7 mg/dL (ref 7.0–26.0)
CO2: 27 meq/L (ref 22–29)
CREATININE: 0.9 mg/dL (ref 0.7–1.3)
Calcium: 12.2 mg/dL — ABNORMAL HIGH (ref 8.4–10.4)
Chloride: 104 mEq/L (ref 98–109)
EGFR: 81 mL/min/{1.73_m2} — ABNORMAL LOW (ref >90)
GLUCOSE: 108 mg/dL (ref 70–140)
Potassium: 3.5 mEq/L (ref 3.5–5.1)
SODIUM: 138 meq/L (ref 136–145)
TOTAL PROTEIN: 6.6 g/dL (ref 6.4–8.3)
Total Bilirubin: 0.66 mg/dL (ref 0.20–1.20)

## 2015-01-16 LAB — CBC WITH DIFFERENTIAL/PLATELET
BASO%: 0.6 % (ref 0.0–2.0)
Basophils Absolute: 0 10*3/uL (ref 0.0–0.1)
EOS%: 5.9 % (ref 0.0–7.0)
Eosinophils Absolute: 0.4 10*3/uL (ref 0.0–0.5)
HCT: 36.2 % — ABNORMAL LOW (ref 38.4–49.9)
HEMOGLOBIN: 12 g/dL — AB (ref 13.0–17.1)
LYMPH%: 24.3 % (ref 14.0–49.0)
MCH: 31 pg (ref 27.2–33.4)
MCHC: 33.1 g/dL (ref 32.0–36.0)
MCV: 93.5 fL (ref 79.3–98.0)
MONO#: 0.7 10*3/uL (ref 0.1–0.9)
MONO%: 10.8 % (ref 0.0–14.0)
NEUT%: 58.4 % (ref 39.0–75.0)
NEUTROS ABS: 3.7 10*3/uL (ref 1.5–6.5)
PLATELETS: 236 10*3/uL (ref 140–400)
RBC: 3.87 10*6/uL — AB (ref 4.20–5.82)
RDW: 14.4 % (ref 11.0–14.6)
WBC: 6.4 10*3/uL (ref 4.0–10.3)
lymph#: 1.6 10*3/uL (ref 0.9–3.3)

## 2015-01-21 ENCOUNTER — Telehealth: Payer: Self-pay | Admitting: *Deleted

## 2015-01-21 NOTE — Telephone Encounter (Signed)
[name] (pt's POA) called with the following concerns. Pt has been extremely tired, after he left rehab he has started back smoking , has cough, sits in front of a heater wrapped in blanket says he is freezing all day, he recently got over shingles which was very difficult for the pt. 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  Discussed with [name], pt's most recent Hgb 12. [name] advised she will be with pt tomorrow at appt, she is very concerned for him as he has refused to go to [facility] to multiple Dr. Kendrick Fries over the next week. Concerns will be given to MD, no further questions.

## 2015-01-22 ENCOUNTER — Other Ambulatory Visit: Payer: Self-pay | Admitting: Cardiology

## 2015-01-22 ENCOUNTER — Encounter: Payer: Self-pay | Admitting: Internal Medicine

## 2015-01-22 ENCOUNTER — Ambulatory Visit (HOSPITAL_BASED_OUTPATIENT_CLINIC_OR_DEPARTMENT_OTHER): Payer: Medicare Other | Admitting: Internal Medicine

## 2015-01-22 VITALS — BP 144/66 | HR 66 | Temp 98.7°F | Resp 18 | Ht 75.0 in

## 2015-01-22 DIAGNOSIS — E46 Unspecified protein-calorie malnutrition: Secondary | ICD-10-CM

## 2015-01-22 DIAGNOSIS — R630 Anorexia: Secondary | ICD-10-CM | POA: Diagnosis not present

## 2015-01-22 DIAGNOSIS — C3432 Malignant neoplasm of lower lobe, left bronchus or lung: Secondary | ICD-10-CM

## 2015-01-22 DIAGNOSIS — Z85118 Personal history of other malignant neoplasm of bronchus and lung: Secondary | ICD-10-CM

## 2015-01-22 MED ORDER — MEGESTROL ACETATE 625 MG/5ML PO SUSP: 625.0000 mg | Freq: Every day | ORAL | Status: DC

## 2015-01-22 NOTE — Progress Notes (Signed)
[place] [phone]   [phone]  OFFICE PROGRESS NOTE  Curly Rim, MD [place] [address] [place] [address]  DIAGNOSIS: Stage IIB (T3, N0, MX) non-small cell lung cancer diagnosed in August of 2007.  PRIOR THERAPY:  1) Status post left upper lobectomy with lymph node dissection under the care of Dr. Arlyce Dice and the final pathology revealed 9.0 CM tumor consistent with squamous cell carcinoma. 2) Status post adjuvant chemotherapy with carboplatin for AUC of 6 and paclitaxel 200 mg/M2 every 3 weeks for a total of 4 cycles.  CURRENT THERAPY: Observation.  INTERVAL HISTORY: [name] 81 y.o. male returns to the clinic today for annual followup visit accompanied by his daughter. The patient was diagnosed in August 2016 with herpes zoster resulting Bell's palsy affecting the right side of the face and right eye with some difficulty swallowing as well as weight loss and lack of appetite. He is drinking a lot of milk with ensure for nutrition. He denied having any significant chest pain, shortness of breath, cough or hemoptysis. The patient denied having any fever or chills. He had repeat CT scan of the chest performed recently and he is here for evaluation and discussion of his scan results.  MEDICAL HISTORY: Past Medical History  Diagnosis Date  . CAD (coronary artery disease)     a. s/p DES-LCx 2006 b. repeat cath 2011- 90% distal LCx ISR, 100% mid RCA CTO, diffuse nonobstructive dz, medical management c. 07/2012 cath- 100% distal LCx ISR, 100% mid RCA CTO, nonobstructive dz; EF 40-45%  . PVC (premature ventricular contraction)   . HTN (hypertension)   . Paroxysmal a-fib ([place])   . PAD (peripheral artery disease) ([facility])     history of stenting in the past  . COPD (chronic obstructive pulmonary disease) ([place])   . LV dysfunction   . Anemia, iron deficiency   . Family history of anesthesia complication     "daughter gets PONV"  (11/20/2013)  . Anginal pain ([place])   . Pneumonia 1-2 X's  . History of blood transfusion     "he's had several; probably related to his cancer" (11/20/2013)  . GERD (gastroesophageal reflux disease)   . H/O hiatal hernia   . History of gastric ulcer   . Arthritis     "generalized"  . Chronic lower back pain   . Anxiety   . Myocardial infarction ([place]) 09/2009; ~ 2014    [name] 10/24/2009;   . Lung cancer ([place]) dx'd 11/2005    non small cell    ALLERGIES:  is allergic to ciprofloxacin; doxycycline; atorvastatin; sulfa drugs cross reactors; and zocor.  MEDICATIONS:  Current Outpatient Prescriptions  Medication Sig Dispense Refill  . acetaminophen (TYLENOL) 325 MG tablet Take 2 tablets (650 mg total) by mouth every 4 (four) hours as needed for headache or mild pain.    Marland Kitchen albuterol (PROVENTIL) (2.5 MG/3ML) 0.083% nebulizer solution Take 2.5 mg by nebulization every 4 (four) hours as needed for wheezing or shortness of breath.    Marland Kitchen amiodarone (PACERONE) 200 MG tablet TAKE 1 TABLET DAILY 30 tablet 1  . amLODipine (NORVASC) 10 MG tablet 10 mg.    . aspirin 81 MG chewable tablet Chew 1 tablet (81 mg total) by mouth daily.    . carbidopa-levodopa (SINEMET IR) 25-100 MG tablet Take 1 tablet by mouth 3 (three) times daily.    . clopidogrel (PLAVIX) 75 MG tablet Take 1 tablet (75 mg  total) by mouth daily. 30 tablet 11  . diazepam (VALIUM) 2 MG tablet Take 1 tablet (2 mg total) by mouth every 8 (eight) hours as needed for muscle spasms. 12 tablet 0  . erythromycin ophthalmic ointment Place 1 application into the right eye 3 (three) times daily.    . feeding supplement (BOOST / RESOURCE BREEZE) LIQD Take 1 Container by mouth daily.  0  . ferrous sulfate 325 (65 FE) MG tablet Take 325 mg by mouth 3 (three) times daily with meals.    . fexofenadine (ALLEGRA) 180 MG tablet Take 180 mg by mouth daily as needed (allergies).     . furosemide (LASIX) 20 MG tablet Take 20 mg by mouth daily as needed.    Marland Kitchen  HYDROcodone-acetaminophen (NORCO/VICODIN) 5-325 MG tablet Take 1 tablet by mouth 2 (two) times daily as needed for moderate pain.    . isosorbide mononitrate (IMDUR) 60 MG 24 hr tablet Take 1 tablet (60 mg total) by mouth at bedtime. 30 tablet 6  . lactose free nutrition (BOOST PLUS) LIQD Take 237 mLs by mouth 3 (three) times daily after meals.  0  . lidocaine (XYLOCAINE) 2 % solution Use as directed 15 mLs in the mouth or throat every 6 (six) hours as needed for mouth pain. 100 mL 0  . losartan (COZAAR) 100 MG tablet Take 100 mg by mouth daily.    . nitroGLYCERIN (NITROSTAT) 0.4 MG SL tablet Place 1 tablet (0.4 mg total) under the tongue every 5 (five) minutes as needed for chest pain (Chest pain). 25 tablet 6  . nystatin (MYCOSTATIN) 100000 UNIT/ML suspension Take 5 mLs (500,000 Units total) by mouth 4 (four) times daily. (Patient taking differently: Take 5 mLs by mouth. ) 60 mL 0  . pantoprazole (PROTONIX) 40 MG tablet TAKE 1 TABLET ONCE A DAY 30 tablet 6  . polyethylene glycol (MIRALAX / GLYCOLAX) packet Take 17 g by mouth daily.     Marland Kitchen PROAIR HFA 108 (90 BASE) MCG/ACT inhaler      No current facility-administered medications for this visit.    SURGICAL HISTORY:  Past Surgical History  Procedure Laterality Date  . Lobectomy Left     left upper  . Appendectomy    . Anterior cervical decomp/discectomy fusion    . Twenty four hour intraesophageal ph profile.  05/02/2001  . Aortogram with bilateral lower extremity runoff.  07/11/2003  . Fiberoptic bronchoscopy, mediastinum.  10/20/2005  . Video assisted thoracoscopy Left 12/01/2005  . Esophagogastroduodenoscopy with biopsy.  04/14/2010  . Iliac artery stent Right 11/20/2013  . Tonsillectomy  1972  . Inguinal hernia repair Bilateral     "I've had several"  . Back surgery      "I've had several back surgeries; got spacers down in lower back" (11/20/2013)  . Cataract extraction w/ intraocular lens  implant, bilateral Bilateral   . Coronary  angioplasty with stent placement  2006    DES to LCX/notes 01/04/2014  . Cardiac catheterization  2011; 07/2012    [name] 01/04/2014  . Femoral artery stent Left 01/04/2014  . Left heart catheterization with coronary angiogram N/A 08/19/2012    Procedure: LEFT HEART CATHETERIZATION WITH CORONARY ANGIOGRAM;  Surgeon: Burnell Blanks, MD;  Location: [facility] CATH LAB;  Service: Cardiovascular;  Laterality: N/A;  . Lower extremity angiogram N/A 11/20/2013    Procedure: LOWER EXTREMITY ANGIOGRAM;  Surgeon: Lorretta Harp, MD;  Location: [facility] CATH LAB;  Service: Cardiovascular;  Laterality: N/A;  . Lower extremity angiogram N/A  01/04/2014    Procedure: LOWER EXTREMITY ANGIOGRAM;  Surgeon: Lorretta Harp, MD;  Location: [facility] CATH LAB;  Service: Cardiovascular;  Laterality: N/A;    REVIEW OF SYSTEMS:  A comprehensive review of systems was negative except for: Constitutional: positive for anorexia, fatigue and weight loss Eyes: positive for visual disturbance Musculoskeletal: positive for muscle weakness   PHYSICAL EXAMINATION: General appearance: alert, cooperative and no distress Head: Normocephalic, without obvious abnormality, atraumatic, Stitches in the right eye Neck: no adenopathy, no JVD, supple, symmetrical, trachea midline and thyroid not enlarged, symmetric, no tenderness/mass/nodules Lymph nodes: Cervical, supraclavicular, and axillary nodes normal. Resp: clear to auscultation bilaterally Back: symmetric, no curvature. ROM normal. No CVA tenderness. Cardio: regular rate and rhythm, S1, S2 normal, no murmur, click, rub or gallop GI: soft, non-tender; bowel sounds normal; no masses,  no organomegaly Extremities: extremities normal, atraumatic, no cyanosis or edema  ECOG PERFORMANCE STATUS: 1 - Symptomatic but completely ambulatory  Blood pressure 144/66, pulse 66, temperature 98.7 F (37.1 C), temperature source Oral, resp. rate 18, height '6\' 3"'$  (1.905 m), SpO2 98 %.  LABORATORY  DATA: Lab Results  Component Value Date   WBC 6.4 01/16/2015   HGB 12.0* 01/16/2015   HCT 36.2* 01/16/2015   MCV 93.5 01/16/2015   PLT 236 01/16/2015      Chemistry      Component Value Date/Time   NA 138 01/16/2015 1255   NA 134* 11/30/2014 0542   K 3.5 01/16/2015 1255   K 3.7 11/30/2014 0542   CL 97* 11/30/2014 0542   CO2 27 01/16/2015 1255   CO2 28 11/30/2014 0542   BUN 11.7 01/16/2015 1255   BUN 9 11/30/2014 0542   CREATININE 0.9 01/16/2015 1255   CREATININE 0.79 11/30/2014 0542   CREATININE 0.75 12/28/2013 1152      Component Value Date/Time   CALCIUM 12.2* 01/16/2015 1255   CALCIUM 10.9* 11/30/2014 0542   ALKPHOS 88 01/16/2015 1255   ALKPHOS 83 11/23/2014 1618   AST 63* 01/16/2015 1255   AST 36 11/23/2014 1618   ALT 43 01/16/2015 1255   ALT 11* 11/23/2014 1618   BILITOT 0.66 01/16/2015 1255   BILITOT 1.0 11/23/2014 1618       RADIOGRAPHIC STUDIES: Ct Chest Wo Contrast  01/16/2015  CLINICAL DATA:  Lung cancer diagnosed 2008. Chemotherapy and radiation therapy complete. EXAM: CT CHEST WITHOUT CONTRAST TECHNIQUE: Multidetector CT imaging of the chest was performed following the standard protocol without IV contrast. COMPARISON:  CT thorax 01/15/2014 FINDINGS: Mediastinum/Nodes: No axillary or supraclavicular lymphadenopathy. No mediastinal hilar lymphadenopathy. No pericardial fluid. Lungs/Pleura: There is volume loss in the LEFT hemi thorax. Surgical clips in the LEFT superior hilum. No new nodularity. There is peripheral pleural parenchymal thickening at the LEFT lung apex not changed from prior. No new pulmonary nodules. Upper abdomen: Limited view of the liver, kidneys, pancreas are unremarkable. Normal adrenal glands. Probable LEFT adrenal adenoma incompletely imaged. Musculoskeletal: No aggressive osseous lesion. Bulky continuous spurring of the thoracic spine. IMPRESSION: 1. Stable postsurgical and post therapy change in LEFT hemi thorax without evidence of local  recurrence or metastasis. 2. Diffuse idiopathic skeletal hyperostosis of the thoracic spine. Electronically Signed   By: Suzy Bouchard M.D.   On: 01/16/2015 14:57   ASSESSMENT AND PLAN: This is a very pleasant 81 years old white male with history of stage IIB non-small cell lung cancer, squamous cell carcinoma status post left upper lobectomy with lymph node dissection followed by 4 cycles of adjuvant chemotherapy.  The patient has been observation for the last 9 years. The recent CT scan of the chest showed no evidence for disease recurrence. I discussed the scan results with the patient and his daughter. I recommended for him to continue on observation with routine follow-up visit with his primary care physician at this point.  I would see him on as-needed basis.  His hypercalcemia is most likely secondary to increased intake of milk products. I advised the patient and his daughter to cut on the amount of milky drinks on daily basis. For the lack of appetite, I started the the patient on Megace ES 5 ML by mouth daily and if he has improvement in his appetite he will get refill of this medication from his primary care physician in the future. He was advised to call immediately if he has any concerning symptoms in the interval. The patient voices understanding of current disease status and treatment options and is in agreement with the current care plan.  All questions were answered. The patient knows to call the clinic with any problems, questions or concerns. We can certainly see the patient much sooner if necessary.  Disclaimer: This note was dictated with voice recognition software. Similar sounding words can inadvertently be transcribed and may be missed upon review.

## 2015-01-22 NOTE — Telephone Encounter (Signed)
REFILL 

## 2015-01-23 ENCOUNTER — Ambulatory Visit: Payer: Medicare Other | Admitting: Internal Medicine

## 2015-02-04 ENCOUNTER — Telehealth: Payer: Self-pay | Admitting: Cardiology

## 2015-02-04 NOTE — Telephone Encounter (Signed)
Received a call from [facility] with [place] office wanting to ask Dr.Jordan if ok for patient to hold Plavix 10 days prior to upcoming eye lid surgery.Message sent to [place] for advice.

## 2015-02-04 NOTE — Telephone Encounter (Signed)
He may hold plavix for 7 days prior to surgery.  Peter Martinique MD, [facility]

## 2015-02-05 NOTE — Telephone Encounter (Signed)
Returned call to [employer] with Dr.Bowen's office.Dr.Jordan advised ok to hold plavix 7 days prior to surgery.

## 2015-02-10 ENCOUNTER — Inpatient Hospital Stay (HOSPITAL_COMMUNITY)
Admission: EM | Admit: 2015-02-10 | Discharge: 2015-02-15 | DRG: 177 | Disposition: A | Discharge status: Hospice | Payer: Medicare Other | Attending: Family Medicine | Admitting: Family Medicine

## 2015-02-10 ENCOUNTER — Emergency Department (HOSPITAL_COMMUNITY): Payer: Medicare Other

## 2015-02-10 ENCOUNTER — Encounter (HOSPITAL_COMMUNITY): Payer: Self-pay | Admitting: Nurse Practitioner

## 2015-02-10 DIAGNOSIS — J440 Chronic obstructive pulmonary disease with acute lower respiratory infection: Secondary | ICD-10-CM | POA: Diagnosis present

## 2015-02-10 DIAGNOSIS — N179 Acute kidney failure, unspecified: Secondary | ICD-10-CM | POA: Diagnosis present

## 2015-02-10 DIAGNOSIS — H02401 Unspecified ptosis of right eyelid: Secondary | ICD-10-CM | POA: Diagnosis present

## 2015-02-10 DIAGNOSIS — Z8701 Personal history of pneumonia (recurrent): Secondary | ICD-10-CM

## 2015-02-10 DIAGNOSIS — E860 Dehydration: Secondary | ICD-10-CM | POA: Diagnosis present

## 2015-02-10 DIAGNOSIS — J9601 Acute respiratory failure with hypoxia: Secondary | ICD-10-CM | POA: Diagnosis present

## 2015-02-10 DIAGNOSIS — Z7982 Long term (current) use of aspirin: Secondary | ICD-10-CM

## 2015-02-10 DIAGNOSIS — I248 Other forms of acute ischemic heart disease: Secondary | ICD-10-CM | POA: Diagnosis present

## 2015-02-10 DIAGNOSIS — G8929 Other chronic pain: Secondary | ICD-10-CM | POA: Diagnosis present

## 2015-02-10 DIAGNOSIS — Z8711 Personal history of peptic ulcer disease: Secondary | ICD-10-CM

## 2015-02-10 DIAGNOSIS — Z803 Family history of malignant neoplasm of breast: Secondary | ICD-10-CM

## 2015-02-10 DIAGNOSIS — I10 Essential (primary) hypertension: Secondary | ICD-10-CM | POA: Diagnosis present

## 2015-02-10 DIAGNOSIS — Z7189 Other specified counseling: Secondary | ICD-10-CM

## 2015-02-10 DIAGNOSIS — G510 Bell's palsy: Secondary | ICD-10-CM | POA: Diagnosis present

## 2015-02-10 DIAGNOSIS — F419 Anxiety disorder, unspecified: Secondary | ICD-10-CM | POA: Diagnosis present

## 2015-02-10 DIAGNOSIS — Z881 Allergy status to other antibiotic agents status: Secondary | ICD-10-CM

## 2015-02-10 DIAGNOSIS — T17908A Unspecified foreign body in respiratory tract, part unspecified causing other injury, initial encounter: Secondary | ICD-10-CM | POA: Insufficient documentation

## 2015-02-10 DIAGNOSIS — R64 Cachexia: Secondary | ICD-10-CM | POA: Diagnosis present

## 2015-02-10 DIAGNOSIS — Z515 Encounter for palliative care: Secondary | ICD-10-CM | POA: Diagnosis present

## 2015-02-10 DIAGNOSIS — I739 Peripheral vascular disease, unspecified: Secondary | ICD-10-CM | POA: Diagnosis present

## 2015-02-10 DIAGNOSIS — E43 Unspecified severe protein-calorie malnutrition: Secondary | ICD-10-CM | POA: Diagnosis present

## 2015-02-10 DIAGNOSIS — Z85118 Personal history of other malignant neoplasm of bronchus and lung: Secondary | ICD-10-CM

## 2015-02-10 DIAGNOSIS — I2510 Atherosclerotic heart disease of native coronary artery without angina pectoris: Secondary | ICD-10-CM | POA: Diagnosis present

## 2015-02-10 DIAGNOSIS — Z9841 Cataract extraction status, right eye: Secondary | ICD-10-CM

## 2015-02-10 DIAGNOSIS — D509 Iron deficiency anemia, unspecified: Secondary | ICD-10-CM | POA: Diagnosis present

## 2015-02-10 DIAGNOSIS — R627 Adult failure to thrive: Secondary | ICD-10-CM | POA: Diagnosis present

## 2015-02-10 DIAGNOSIS — E876 Hypokalemia: Secondary | ICD-10-CM | POA: Diagnosis present

## 2015-02-10 DIAGNOSIS — Z79899 Other long term (current) drug therapy: Secondary | ICD-10-CM

## 2015-02-10 DIAGNOSIS — I252 Old myocardial infarction: Secondary | ICD-10-CM | POA: Diagnosis not present

## 2015-02-10 DIAGNOSIS — Z955 Presence of coronary angioplasty implant and graft: Secondary | ICD-10-CM

## 2015-02-10 DIAGNOSIS — J208 Acute bronchitis due to other specified organisms: Secondary | ICD-10-CM | POA: Diagnosis not present

## 2015-02-10 DIAGNOSIS — K219 Gastro-esophageal reflux disease without esophagitis: Secondary | ICD-10-CM | POA: Diagnosis present

## 2015-02-10 DIAGNOSIS — Z6820 Body mass index (BMI) 20.0-20.9, adult: Secondary | ICD-10-CM | POA: Diagnosis not present

## 2015-02-10 DIAGNOSIS — R1310 Dysphagia, unspecified: Secondary | ICD-10-CM | POA: Insufficient documentation

## 2015-02-10 DIAGNOSIS — M545 Low back pain: Secondary | ICD-10-CM | POA: Diagnosis present

## 2015-02-10 DIAGNOSIS — I5022 Chronic systolic (congestive) heart failure: Secondary | ICD-10-CM | POA: Diagnosis present

## 2015-02-10 DIAGNOSIS — G20 Parkinson's disease: Secondary | ICD-10-CM | POA: Diagnosis present

## 2015-02-10 DIAGNOSIS — Z87891 Personal history of nicotine dependence: Secondary | ICD-10-CM

## 2015-02-10 DIAGNOSIS — I493 Ventricular premature depolarization: Secondary | ICD-10-CM | POA: Diagnosis present

## 2015-02-10 DIAGNOSIS — Z882 Allergy status to sulfonamides status: Secondary | ICD-10-CM | POA: Diagnosis not present

## 2015-02-10 DIAGNOSIS — J690 Pneumonitis due to inhalation of food and vomit: Principal | ICD-10-CM | POA: Diagnosis present

## 2015-02-10 DIAGNOSIS — Z9842 Cataract extraction status, left eye: Secondary | ICD-10-CM | POA: Diagnosis not present

## 2015-02-10 DIAGNOSIS — L8990 Pressure ulcer of unspecified site, unspecified stage: Secondary | ICD-10-CM | POA: Insufficient documentation

## 2015-02-10 DIAGNOSIS — Z888 Allergy status to other drugs, medicaments and biological substances status: Secondary | ICD-10-CM

## 2015-02-10 DIAGNOSIS — Z7902 Long term (current) use of antithrombotics/antiplatelets: Secondary | ICD-10-CM

## 2015-02-10 DIAGNOSIS — B0221 Postherpetic geniculate ganglionitis: Secondary | ICD-10-CM | POA: Diagnosis present

## 2015-02-10 DIAGNOSIS — Z961 Presence of intraocular lens: Secondary | ICD-10-CM | POA: Diagnosis present

## 2015-02-10 DIAGNOSIS — I2582 Chronic total occlusion of coronary artery: Secondary | ICD-10-CM | POA: Diagnosis present

## 2015-02-10 DIAGNOSIS — J209 Acute bronchitis, unspecified: Secondary | ICD-10-CM | POA: Diagnosis present

## 2015-02-10 DIAGNOSIS — M1990 Unspecified osteoarthritis, unspecified site: Secondary | ICD-10-CM | POA: Diagnosis present

## 2015-02-10 DIAGNOSIS — I255 Ischemic cardiomyopathy: Secondary | ICD-10-CM | POA: Diagnosis present

## 2015-02-10 DIAGNOSIS — R531 Weakness: Secondary | ICD-10-CM | POA: Diagnosis present

## 2015-02-10 DIAGNOSIS — Z66 Do not resuscitate: Secondary | ICD-10-CM | POA: Diagnosis present

## 2015-02-10 DIAGNOSIS — I480 Paroxysmal atrial fibrillation: Secondary | ICD-10-CM | POA: Diagnosis present

## 2015-02-10 DIAGNOSIS — T17998A Other foreign object in respiratory tract, part unspecified causing other injury, initial encounter: Secondary | ICD-10-CM | POA: Diagnosis not present

## 2015-02-10 DIAGNOSIS — R0600 Dyspnea, unspecified: Secondary | ICD-10-CM | POA: Diagnosis not present

## 2015-02-10 LAB — URINALYSIS, ROUTINE W REFLEX MICROSCOPIC
BILIRUBIN URINE: NEGATIVE
GLUCOSE, UA: NEGATIVE mg/dL
Hgb urine dipstick: NEGATIVE
KETONES UR: NEGATIVE mg/dL
LEUKOCYTES UA: NEGATIVE
NITRITE: NEGATIVE
PH: 7 (ref 5.0–8.0)
PROTEIN: NEGATIVE mg/dL
Specific Gravity, Urine: 1.014 (ref 1.005–1.030)

## 2015-02-10 LAB — EXPECTORATED SPUTUM ASSESSMENT W REFEX TO RESP CULTURE

## 2015-02-10 LAB — PROTIME-INR
INR: 1.14 (ref 0.00–1.49)
PROTHROMBIN TIME: 14.8 s (ref 11.6–15.2)

## 2015-02-10 LAB — CBC WITH DIFFERENTIAL/PLATELET
BASOS PCT: 0 %
Basophils Absolute: 0 10*3/uL (ref 0.0–0.1)
EOS ABS: 0.3 10*3/uL (ref 0.0–0.7)
EOS PCT: 4 %
HCT: 35.6 % — ABNORMAL LOW (ref 39.0–52.0)
HEMOGLOBIN: 11.8 g/dL — AB (ref 13.0–17.0)
Lymphocytes Relative: 17 %
Lymphs Abs: 1.4 10*3/uL (ref 0.7–4.0)
MCH: 30.6 pg (ref 26.0–34.0)
MCHC: 33.1 g/dL (ref 30.0–36.0)
MCV: 92.2 fL (ref 78.0–100.0)
MONOS PCT: 11 %
Monocytes Absolute: 1 10*3/uL (ref 0.1–1.0)
NEUTROS PCT: 68 %
Neutro Abs: 5.7 10*3/uL (ref 1.7–7.7)
PLATELETS: 211 10*3/uL (ref 150–400)
RBC: 3.86 MIL/uL — ABNORMAL LOW (ref 4.22–5.81)
RDW: 14.4 % (ref 11.5–15.5)
WBC: 8.4 10*3/uL (ref 4.0–10.5)

## 2015-02-10 LAB — COMPREHENSIVE METABOLIC PANEL
ALBUMIN: 3.3 g/dL — AB (ref 3.5–5.0)
ALK PHOS: 85 U/L (ref 38–126)
ALT: 29 U/L (ref 17–63)
ANION GAP: 7 (ref 5–15)
AST: 24 U/L (ref 15–41)
BUN: 21 mg/dL — ABNORMAL HIGH (ref 6–20)
CALCIUM: 12.1 mg/dL — AB (ref 8.9–10.3)
CHLORIDE: 103 mmol/L (ref 101–111)
CO2: 31 mmol/L (ref 22–32)
Creatinine, Ser: 1.26 mg/dL — ABNORMAL HIGH (ref 0.61–1.24)
GFR calc Af Amer: >60 mL/min (ref >60)
GFR calc non Af Amer: 52 mL/min — ABNORMAL LOW (ref >60)
GLUCOSE: 101 mg/dL — AB (ref 65–99)
POTASSIUM: 2.7 mmol/L — AB (ref 3.5–5.1)
SODIUM: 141 mmol/L (ref 135–145)
Total Bilirubin: 0.5 mg/dL (ref 0.3–1.2)
Total Protein: 6.5 g/dL (ref 6.5–8.1)

## 2015-02-10 LAB — TROPONIN I
Troponin I: 0.07 ng/mL — ABNORMAL HIGH (ref <0.031)
Troponin I: 0.07 ng/mL — ABNORMAL HIGH (ref <0.031)

## 2015-02-10 LAB — TSH: TSH: 0.052 u[IU]/mL — AB (ref 0.350–4.500)

## 2015-02-10 LAB — I-STAT TROPONIN, ED: Troponin i, poc: 0.09 ng/mL (ref 0.00–0.08)

## 2015-02-10 LAB — EXPECTORATED SPUTUM ASSESSMENT W GRAM STAIN, RFLX TO RESP C

## 2015-02-10 MED ORDER — PANTOPRAZOLE SODIUM 40 MG PO TBEC
40.0000 mg | DELAYED_RELEASE_TABLET | Freq: Every day | ORAL | Status: DC
  Administered 2015-02-10 – 2015-02-14 (×5): 40 mg via ORAL
  Filled 2015-02-10 (×6): qty 1

## 2015-02-10 MED ORDER — ASPIRIN 81 MG PO CHEW
81.0000 mg | CHEWABLE_TABLET | Freq: Every day | ORAL | Status: DC
  Administered 2015-02-11 – 2015-02-14 (×4): 81 mg via ORAL
  Filled 2015-02-10 (×5): qty 1

## 2015-02-10 MED ORDER — POTASSIUM CHLORIDE 10 MEQ/100ML IV SOLN
10.0000 meq | Freq: Once | INTRAVENOUS | Status: AC
  Administered 2015-02-10: 10 meq via INTRAVENOUS
  Filled 2015-02-10: qty 100

## 2015-02-10 MED ORDER — BOOST / RESOURCE BREEZE PO LIQD: 1.0000 | ORAL | Status: DC

## 2015-02-10 MED ORDER — NITROGLYCERIN 0.4 MG SL SUBL: 0.4000 mg | SUBLINGUAL_TABLET | SUBLINGUAL | Status: DC | PRN

## 2015-02-10 MED ORDER — ALBUTEROL SULFATE (2.5 MG/3ML) 0.083% IN NEBU: 2.5000 mg | INHALATION_SOLUTION | RESPIRATORY_TRACT | Status: DC | PRN

## 2015-02-10 MED ORDER — DIAZEPAM 2 MG PO TABS
2.0000 mg | ORAL_TABLET | Freq: Three times a day (TID) | ORAL | Status: DC | PRN
  Administered 2015-02-11: 2 mg via ORAL
  Filled 2015-02-10 (×3): qty 1

## 2015-02-10 MED ORDER — BISACODYL 10 MG RE SUPP: 10.0000 mg | Freq: Every day | RECTAL | Status: DC | PRN

## 2015-02-10 MED ORDER — DEXTROSE 5 % IV SOLN
500.0000 mg | INTRAVENOUS | Status: DC
  Administered 2015-02-10 – 2015-02-13 (×4): 500 mg via INTRAVENOUS
  Filled 2015-02-10 (×4): qty 500

## 2015-02-10 MED ORDER — HEPARIN SODIUM (PORCINE) 5000 UNIT/ML IJ SOLN
5000.0000 [IU] | Freq: Three times a day (TID) | INTRAMUSCULAR | Status: DC
  Administered 2015-02-10 – 2015-02-15 (×14): 5000 [IU] via SUBCUTANEOUS
  Filled 2015-02-10 (×14): qty 1

## 2015-02-10 MED ORDER — POTASSIUM CHLORIDE CRYS ER 20 MEQ PO TBCR
40.0000 meq | EXTENDED_RELEASE_TABLET | ORAL | Status: AC
  Administered 2015-02-10: 40 meq via ORAL
  Filled 2015-02-10: qty 2

## 2015-02-10 MED ORDER — HYDRALAZINE HCL 25 MG PO TABS
25.0000 mg | ORAL_TABLET | Freq: Three times a day (TID) | ORAL | Status: DC
  Administered 2015-02-10 – 2015-02-13 (×7): 25 mg via ORAL
  Filled 2015-02-10 (×9): qty 1

## 2015-02-10 MED ORDER — AMIODARONE HCL 100 MG PO TABS
100.0000 mg | ORAL_TABLET | Freq: Every day | ORAL | Status: DC
  Administered 2015-02-10 – 2015-02-14 (×5): 100 mg via ORAL
  Filled 2015-02-10 (×5): qty 1

## 2015-02-10 MED ORDER — NITROGLYCERIN 2 % TD OINT
1.0000 [in_us] | TOPICAL_OINTMENT | Freq: Once | TRANSDERMAL | Status: AC
  Administered 2015-02-10: 1 [in_us] via TOPICAL
  Filled 2015-02-10: qty 30

## 2015-02-10 MED ORDER — SODIUM CHLORIDE 0.9 % IJ SOLN
3.0000 mL | Freq: Two times a day (BID) | INTRAMUSCULAR | Status: DC
  Administered 2015-02-11 – 2015-02-13 (×4): 3 mL via INTRAVENOUS

## 2015-02-10 MED ORDER — CETYLPYRIDINIUM CHLORIDE 0.05 % MT LIQD
7.0000 mL | Freq: Two times a day (BID) | OROMUCOSAL | Status: DC
  Administered 2015-02-11 – 2015-02-14 (×7): 7 mL via OROMUCOSAL

## 2015-02-10 MED ORDER — KCL IN DEXTROSE-NACL 40-5-0.9 MEQ/L-%-% IV SOLN
INTRAVENOUS | Status: AC
  Administered 2015-02-10 – 2015-02-11 (×2): via INTRAVENOUS
  Filled 2015-02-10 (×3): qty 1000

## 2015-02-10 MED ORDER — ISOSORBIDE MONONITRATE ER 30 MG PO TB24
60.0000 mg | ORAL_TABLET | Freq: Every morning | ORAL | Status: DC
  Administered 2015-02-11 – 2015-02-14 (×4): 60 mg via ORAL
  Filled 2015-02-10 (×4): qty 2

## 2015-02-10 MED ORDER — SODIUM CHLORIDE 0.9 % IV SOLN
1.5000 g | Freq: Four times a day (QID) | INTRAVENOUS | Status: DC
  Administered 2015-02-10 – 2015-02-14 (×15): 1.5 g via INTRAVENOUS
  Filled 2015-02-10 (×16): qty 1.5

## 2015-02-10 MED ORDER — POLYETHYLENE GLYCOL 3350 17 G PO PACK
17.0000 g | PACK | Freq: Every day | ORAL | Status: DC
  Administered 2015-02-11 – 2015-02-13 (×3): 17 g via ORAL
  Filled 2015-02-10 (×4): qty 1

## 2015-02-10 MED ORDER — SODIUM CHLORIDE 0.9 % IV SOLN: INTRAVENOUS | Status: DC

## 2015-02-10 MED ORDER — HYDROCODONE-ACETAMINOPHEN 5-325 MG PO TABS
1.0000 | ORAL_TABLET | ORAL | Status: DC | PRN
  Administered 2015-02-14: 1 via ORAL
  Filled 2015-02-10: qty 1

## 2015-02-10 MED ORDER — ONDANSETRON HCL 4 MG/2ML IJ SOLN: 4.0000 mg | Freq: Four times a day (QID) | INTRAMUSCULAR | Status: DC | PRN

## 2015-02-10 MED ORDER — MEGESTROL ACETATE 625 MG/5ML PO SUSP: 625.0000 mg | Freq: Every day | ORAL | Status: DC

## 2015-02-10 MED ORDER — GUAIFENESIN-DM 100-10 MG/5ML PO SYRP: 5.0000 mL | ORAL_SOLUTION | ORAL | Status: DC | PRN

## 2015-02-10 MED ORDER — ASPIRIN 81 MG PO CHEW
81.0000 mg | CHEWABLE_TABLET | Freq: Once | ORAL | Status: AC
  Administered 2015-02-10: 81 mg via ORAL
  Filled 2015-02-10: qty 1

## 2015-02-10 MED ORDER — ALBUTEROL SULFATE (2.5 MG/3ML) 0.083% IN NEBU: 3.0000 mL | INHALATION_SOLUTION | RESPIRATORY_TRACT | Status: DC | PRN

## 2015-02-10 MED ORDER — LORATADINE 10 MG PO TABS
10.0000 mg | ORAL_TABLET | Freq: Every day | ORAL | Status: DC
  Administered 2015-02-13: 10 mg via ORAL
  Filled 2015-02-10 (×3): qty 1

## 2015-02-10 MED ORDER — HYDRALAZINE HCL 20 MG/ML IJ SOLN
10.0000 mg | Freq: Four times a day (QID) | INTRAMUSCULAR | Status: DC | PRN
  Administered 2015-02-14: 10 mg via INTRAVENOUS
  Filled 2015-02-10: qty 1

## 2015-02-10 MED ORDER — FERROUS SULFATE 325 (65 FE) MG PO TABS: 325.0000 mg | ORAL_TABLET | Freq: Three times a day (TID) | ORAL | Status: DC | PRN

## 2015-02-10 MED ORDER — ERYTHROMYCIN 5 MG/GM OP OINT
1.0000 "application " | TOPICAL_OINTMENT | Freq: Three times a day (TID) | OPHTHALMIC | Status: DC
  Administered 2015-02-10 – 2015-02-14 (×12): 1 via OPHTHALMIC
  Filled 2015-02-10: qty 3.5

## 2015-02-10 MED ORDER — ALUM & MAG HYDROXIDE-SIMETH 200-200-20 MG/5ML PO SUSP: 30.0000 mL | Freq: Four times a day (QID) | ORAL | Status: DC | PRN

## 2015-02-10 MED ORDER — ONDANSETRON HCL 4 MG PO TABS: 4.0000 mg | ORAL_TABLET | Freq: Four times a day (QID) | ORAL | Status: DC | PRN

## 2015-02-10 MED ORDER — BOOST PLUS PO LIQD
237.0000 mL | Freq: Three times a day (TID) | ORAL | Status: DC
  Administered 2015-02-11: 237 mL via ORAL
  Filled 2015-02-10 (×3): qty 237

## 2015-02-10 MED ORDER — SODIUM CHLORIDE 0.9 % IV BOLUS (SEPSIS)
500.0000 mL | Freq: Once | INTRAVENOUS | Status: AC
Start: 2015-02-10 — End: 2015-02-10
  Administered 2015-02-10: 500 mL via INTRAVENOUS

## 2015-02-10 MED ORDER — SODIUM CHLORIDE 0.9 % IV BOLUS (SEPSIS)
1000.0000 mL | Freq: Once | INTRAVENOUS | Status: AC
  Administered 2015-02-10: 1000 mL via INTRAVENOUS

## 2015-02-10 MED ORDER — ACETAMINOPHEN 325 MG PO TABS
650.0000 mg | ORAL_TABLET | ORAL | Status: DC | PRN
  Administered 2015-02-14: 650 mg via ORAL
  Filled 2015-02-10: qty 2

## 2015-02-10 MED ORDER — CARVEDILOL 3.125 MG PO TABS
3.1250 mg | ORAL_TABLET | Freq: Two times a day (BID) | ORAL | Status: DC
  Administered 2015-02-10 – 2015-02-14 (×8): 3.125 mg via ORAL
  Filled 2015-02-10 (×9): qty 1

## 2015-02-10 MED ORDER — SODIUM CHLORIDE 0.9 % IV SOLN
1.5000 g | Freq: Once | INTRAVENOUS | Status: AC
  Administered 2015-02-10: 1.5 g via INTRAVENOUS
  Filled 2015-02-10: qty 1.5

## 2015-02-10 MED ORDER — AMLODIPINE BESYLATE 5 MG PO TABS
5.0000 mg | ORAL_TABLET | Freq: Every morning | ORAL | Status: DC
  Administered 2015-02-11 – 2015-02-13 (×3): 5 mg via ORAL
  Filled 2015-02-10 (×3): qty 1

## 2015-02-10 MED ORDER — CLOPIDOGREL BISULFATE 75 MG PO TABS
75.0000 mg | ORAL_TABLET | Freq: Every day | ORAL | Status: DC
  Administered 2015-02-10 – 2015-02-14 (×5): 75 mg via ORAL
  Filled 2015-02-10 (×6): qty 1

## 2015-02-10 NOTE — ED Notes (Signed)
Bed: UC76 Expected date: 02/10/15 Expected time: 8:10 AM Means of arrival: Ambulance Comments: Weakness

## 2015-02-10 NOTE — ED Notes (Signed)
Patient transferred to [place] room 38. Family at bedside.

## 2015-02-10 NOTE — ED Notes (Signed)
Pt can go to floor at 14:30

## 2015-02-10 NOTE — ED Notes (Signed)
Patient aware urine sample is needed, urinal is at the bedside.

## 2015-02-10 NOTE — ED Provider Notes (Signed)
CSN: [id number]     Arrival date & time 02/10/15  0813 History   First MD Initiated Contact with Patient 02/10/15 0831     Chief Complaint  Patient presents with  . Weakness     (Consider location/radiation/quality/duration/timing/severity/associated sxs/prior Treatment) Patient is a 81 y.o. male presenting with weakness. The history is provided by the patient (Patient complains of general weakness for a few days. Poor by mouth intake.).  Weakness This is a new problem. The current episode started 2 days ago. The problem occurs constantly. The problem has not changed since onset.Pertinent negatives include no chest pain, no abdominal pain and no headaches. Nothing aggravates the symptoms. Nothing relieves the symptoms.    Past Medical History  Diagnosis Date  . CAD (coronary artery disease)     a. s/p DES-LCx 2006 b. repeat cath 2011- 90% distal LCx ISR, 100% mid RCA CTO, diffuse nonobstructive dz, medical management c. 07/2012 cath- 100% distal LCx ISR, 100% mid RCA CTO, nonobstructive dz; EF 40-45%  . PVC (premature ventricular contraction)   . HTN (hypertension)   . Paroxysmal a-fib ([place])   . PAD (peripheral artery disease) ([facility])     history of stenting in the past  . COPD (chronic obstructive pulmonary disease) ([place])   . LV dysfunction   . Anemia, iron deficiency   . Family history of anesthesia complication     "daughter gets PONV" (11/20/2013)  . Anginal pain ([place])   . Pneumonia 1-2 X's  . History of blood transfusion     "he's had several; probably related to his cancer" (11/20/2013)  . GERD (gastroesophageal reflux disease)   . H/O hiatal hernia   . History of gastric ulcer   . Arthritis     "generalized"  . Chronic lower back pain   . Anxiety   . Myocardial infarction ([place]) 09/2009; ~ 2014    [name] 10/24/2009;   . Lung cancer ([place]) dx'd 11/2005    non small cell   Past Surgical History  Procedure Laterality Date  . Lobectomy Left     left upper  . Appendectomy     . Anterior cervical decomp/discectomy fusion    . Twenty four hour intraesophageal ph profile.  05/02/2001  . Aortogram with bilateral lower extremity runoff.  07/11/2003  . Fiberoptic bronchoscopy, mediastinum.  10/20/2005  . Video assisted thoracoscopy Left 12/01/2005  . Esophagogastroduodenoscopy with biopsy.  04/14/2010  . Iliac artery stent Right 11/20/2013  . Tonsillectomy  1972  . Inguinal hernia repair Bilateral     "I've had several"  . Back surgery      "I've had several back surgeries; got spacers down in lower back" (11/20/2013)  . Cataract extraction w/ intraocular lens  implant, bilateral Bilateral   . Coronary angioplasty with stent placement  2006    DES to LCX/notes 01/04/2014  . Cardiac catheterization  2011; 07/2012    [name] 01/04/2014  . Femoral artery stent Left 01/04/2014  . Left heart catheterization with coronary angiogram N/A 08/19/2012    Procedure: LEFT HEART CATHETERIZATION WITH CORONARY ANGIOGRAM;  Surgeon: Burnell Blanks, MD;  Location: [facility] CATH LAB;  Service: Cardiovascular;  Laterality: N/A;  . Lower extremity angiogram N/A 11/20/2013    Procedure: LOWER EXTREMITY ANGIOGRAM;  Surgeon: Lorretta Harp, MD;  Location: [facility] CATH LAB;  Service: Cardiovascular;  Laterality: N/A;  . Lower extremity angiogram N/A 01/04/2014    Procedure: LOWER EXTREMITY ANGIOGRAM;  Surgeon: Lorretta Harp, MD;  Location: [facility] CATH LAB;  Service: Cardiovascular;  Laterality: N/A;   Family History  Problem Relation Age of Onset  . Heart Problems Mother   . Breast cancer Mother   . Other Father     Motorcycle Accident  . Breast cancer Sister    Social History  Substance Use Topics  . Smoking status: Former Smoker -- 1.00 packs/day for 65 years    Types: Cigarettes    Quit date: 11/20/2014  . Smokeless tobacco: Never Used  . Alcohol Use: Yes     Comment: quit 40+ years ago    Review of Systems  Constitutional: Negative for appetite change and fatigue.  HENT:  Negative for congestion, ear discharge and sinus pressure.   Eyes: Negative for discharge.  Respiratory: Positive for cough.   Cardiovascular: Negative for chest pain.  Gastrointestinal: Negative for abdominal pain and diarrhea.  Genitourinary: Negative for frequency and hematuria.  Musculoskeletal: Negative for back pain.  Skin: Negative for rash.  Neurological: Positive for weakness. Negative for seizures and headaches.  Psychiatric/Behavioral: Negative for hallucinations.      Allergies  Ciprofloxacin; Doxycycline; Atorvastatin; and Sulfa drugs cross reactors  Home Medications   Prior to Admission medications   Medication Sig Start Date End Date Taking? Authorizing Provider  acetaminophen (TYLENOL) 325 MG tablet Take 2 tablets (650 mg total) by mouth every 4 (four) hours as needed for headache or mild pain. 01/05/14  Yes Luke K Kilroy, PA-C  albuterol (PROVENTIL) (2.5 MG/3ML) 0.083% nebulizer solution Take 2.5 mg by nebulization every 4 (four) hours as needed for wheezing or shortness of breath.  01/22/15  Yes Historical Provider, MD  amiodarone (PACERONE) 100 MG tablet Take 100 mg by mouth at bedtime.   Yes Historical Provider, MD  amLODipine (NORVASC) 10 MG tablet Take 5 mg by mouth every morning.  09/25/14  Yes Historical Provider, MD  aspirin 81 MG chewable tablet Chew 1 tablet (81 mg total) by mouth daily. 01/05/14  Yes Erlene Quan, PA-C  clopidogrel (PLAVIX) 75 MG tablet Take 1 tablet (75 mg total) by mouth daily. 01/22/15  Yes Luke K Kilroy, PA-C  diazepam (VALIUM) 2 MG tablet Take 1 tablet (2 mg total) by mouth every 8 (eight) hours as needed for muscle spasms. 11/30/14  Yes Shanker Kristeen Mans, MD  erythromycin ophthalmic ointment Place 1 application into the right eye 3 (three) times daily.   Yes Historical Provider, MD  feeding supplement (BOOST / RESOURCE BREEZE) LIQD Take 1 Container by mouth daily. 11/30/14  Yes Shanker Kristeen Mans, MD  ferrous sulfate 325 (65 FE) MG tablet  Take 325 mg by mouth 3 (three) times daily as needed (for low iron, patient preference).  11/13/13  Yes Modena Jansky, MD  fexofenadine (ALLEGRA) 180 MG tablet Take 180 mg by mouth daily as needed (allergies).    Yes Historical Provider, MD  HYDROcodone-acetaminophen (NORCO/VICODIN) 5-325 MG tablet Take 1 tablet by mouth 2 (two) times daily as needed for moderate pain.   Yes Historical Provider, MD  isosorbide mononitrate (IMDUR) 60 MG 24 hr tablet Take 1 tablet (60 mg total) by mouth at bedtime. Patient taking differently: Take 60 mg by mouth every morning.  07/17/14  Yes Peter M Martinique, MD  lactose free nutrition (BOOST PLUS) LIQD Take 237 mLs by mouth 3 (three) times daily after meals. 11/30/14  Yes Shanker Kristeen Mans, MD  losartan (COZAAR) 100 MG tablet Take 100 mg by mouth at bedtime.    Yes Historical Provider, MD  megestrol (MEGACE ES) 625  MG/5ML suspension Take 5 mLs (625 mg total) by mouth daily. 01/22/15  Yes Curt Bears, MD  nitroGLYCERIN (NITROSTAT) 0.4 MG SL tablet Place 1 tablet (0.4 mg total) under the tongue every 5 (five) minutes as needed for chest pain (Chest pain). 11/29/13  Yes Burtis Junes, NP  pantoprazole (PROTONIX) 40 MG tablet TAKE 1 TABLET ONCE A DAY 04/16/14  Yes Lorretta Harp, MD  polyethylene glycol [facility] / GLYCOLAX) packet Take 17 g by mouth daily.    Yes Historical Provider, MD  PROAIR HFA 108 (90 BASE) MCG/ACT inhaler Inhale 2 puffs into the lungs every 4 (four) hours as needed for wheezing or shortness of breath.  11/21/14  Yes Historical Provider, MD  amiodarone (PACERONE) 200 MG tablet TAKE 1 TABLET DAILY Patient not taking: Reported on 02/10/2015 01/07/15   Peter M Martinique, MD  carbidopa-levodopa (SINEMET IR) 25-100 MG tablet Take 1 tablet by mouth 3 (three) times daily. Reported on 02/10/2015    Historical Provider, MD  lidocaine (XYLOCAINE) 2 % solution Use as directed 15 mLs in the mouth or throat every 6 (six) hours as needed for mouth pain. Patient  not taking: Reported on 02/10/2015 11/30/14   Jonetta Osgood, MD  nystatin (MYCOSTATIN) 100000 UNIT/ML suspension Take 5 mLs (500,000 Units total) by mouth 4 (four) times daily. Patient not taking: Reported on 02/10/2015 11/30/14   Jonetta Osgood, MD   BP 168/66 mmHg  Pulse 72  Temp(Src) 97.6 F (36.4 C) (Oral)  Resp 14  Ht '6\' 3"'$  (1.905 m)  Wt 183 lb (83.008 kg)  BMI 22.87 kg/m2  SpO2 98% Physical Exam  Constitutional: He is oriented to person, place, and time. He appears well-developed.  HENT:  Head: Normocephalic.  Dry mucous membranes  Eyes: Conjunctivae and EOM are normal. No scleral icterus.  Neck: Neck supple. No thyromegaly present.  Cardiovascular: Normal rate and regular rhythm.  Exam reveals no gallop and no friction rub.   No murmur heard. Pulmonary/Chest: No stridor. He has no wheezes. He has no rales. He exhibits no tenderness.  Abdominal: He exhibits no distension. There is no tenderness. There is no rebound.  Musculoskeletal: Normal range of motion. He exhibits no edema.  Lymphadenopathy:    He has no cervical adenopathy.  Neurological: He is oriented to person, place, and time. He exhibits normal muscle tone. Coordination normal.  Skin: No rash noted. No erythema.  Psychiatric: He has a normal mood and affect. His behavior is normal.    ED Course  Procedures (including critical care time) Labs Review Labs Reviewed  CBC WITH DIFFERENTIAL/PLATELET - Abnormal; Notable for the following:    RBC 3.86 (*)    Hemoglobin 11.8 (*)    HCT 35.6 (*)    All other components within normal limits  COMPREHENSIVE METABOLIC PANEL - Abnormal; Notable for the following:    Potassium 2.7 (*)    Glucose, Bld 101 (*)    BUN 21 (*)    Creatinine, Ser 1.26 (*)    Calcium 12.1 (*)    Albumin 3.3 (*)    GFR calc non Af Amer 52 (*)    All other components within normal limits  URINALYSIS, ROUTINE W REFLEX MICROSCOPIC (NOT AT [facility]) - Abnormal; Notable for the following:     APPearance CLOUDY (*)    All other components within normal limits  I-STAT TROPOININ, ED - Abnormal; Notable for the following:    Troponin i, poc 0.09 (*)    All other components  within normal limits  CULTURE, EXPECTORATED SPUTUM-ASSESSMENT  TROPONIN I  TROPONIN I  TROPONIN I    Imaging Review Dg Chest 2 View  02/10/2015  CLINICAL DATA:  Increasing weakness, low back pain and lower abdominal pain, productive cough with green sputum. History of lung cancer in 2008 status post chemotherapy and radiation therapy. EXAM: CHEST  2 VIEW COMPARISON:  Chest CT dated 01/16/2015 and chest x-ray dated 11/22/2014. FINDINGS: Heart size is upper normal, unchanged. Overall cardiomediastinal silhouette is stable in size and configuration. Surgical clips again noted in the left hilum related to patient's previous partial left lung resection. The chronic pleural-parenchymal thickening/fibrosis at the left lung apex is unchanged. Additional mild scarring/fibrosis at the left lung base is unchanged. No new lung findings seen. Again noted is the diffuse DISH throughout the slightly kyphotic thoracic spine. Osseous structures about the chest are otherwise unremarkable. IMPRESSION: Stable chest x-ray. No evidence of acute cardiopulmonary abnormality. No evidence of pneumonia. Electronically Signed   By: Franki Cabot M.D.   On: 02/10/2015 10:43   I have personally reviewed and evaluated these images and lab results as part of my medical decision-making.   EKG Interpretation   Date/Time:  Sunday February 10 2015 11:12:09 EST Ventricular Rate:  59 PR Interval:    QRS Duration: 137 QT Interval:  560 QTC Calculation: 555 R Axis:   -110 Text Interpretation:  Nonspecific IVCD with LAD Anteroseptal infarct, age  indeterminate Confirmed by Zackari Ruane  MD, Brendan Gadson [phone]) on 02/10/2015  11:35:32 AM      MDM   Final diagnoses:  Hypokalemia   Patient with upper respiratory infection fatigue hypokalemia. Patient also  has elevated troponin have consult with cardiology will come see the patient. He will be admitted to medicine    Milton Ferguson, MD 02/10/15 1341

## 2015-02-10 NOTE — Progress Notes (Signed)
ANTIBIOTIC CONSULT NOTE - INITIAL  Pharmacy Consult for Unasyn, Azithromycin Indication: URI  Allergies  Allergen Reactions  . Ciprofloxacin Other (See Comments)    Joint pain  . Doxycycline Other (See Comments)    arthralgias  . Atorvastatin Other (See Comments)    Joint swelling  . Sulfa Drugs Cross Reactors Rash    Patient Measurements: Height: '6\' 3"'$  (190.5 cm) Weight: 183 lb (83.008 kg) IBW/kg (Calculated) : 84.5  Vital Signs: Temp: 97.6 F (36.4 C) (12/18 0817) Temp Source: Oral (12/18 0817) BP: 168/66 mmHg (12/18 1130) Pulse Rate: 72 (12/18 1130) Intake/Output from previous day:   Intake/Output from this shift:    Labs:  Recent Labs  02/10/15 0900  WBC 8.4  HGB 11.8*  PLT 211  CREATININE 1.26*   Estimated Creatinine Clearance: 55.8 mL/min (by C-G formula based on Cr of 1.26). No results for input(s): VANCOTROUGH, VANCOPEAK, VANCORANDOM, GENTTROUGH, GENTPEAK, GENTRANDOM, TOBRATROUGH, TOBRAPEAK, TOBRARND, AMIKACINPEAK, AMIKACINTROU, AMIKACIN in the last 72 hours.   Microbiology: No results found for this or any previous visit (from the past 720 hour(s)).  Medical History: Past Medical History  Diagnosis Date  . CAD (coronary artery disease)     a. s/p DES-LCx 2006 b. repeat cath 2011- 90% distal LCx ISR, 100% mid RCA CTO, diffuse nonobstructive dz, medical management c. 07/2012 cath- 100% distal LCx ISR, 100% mid RCA CTO, nonobstructive dz; EF 40-45%  . PVC (premature ventricular contraction)   . HTN (hypertension)   . Paroxysmal a-fib ([place])   . PAD (peripheral artery disease) (H[facility])     history of stenting in the past  . COPD (chronic obstructive pulmonary disease) ([place])   . LV dysfunction   . Anemia, iron deficiency   . Family history of anesthesia complication     "daughter gets PONV" (11/20/2013)  . Anginal pain ([place])   . Pneumonia 1-2 X's  . History of blood transfusion     "he's had several; probably related to his cancer" (11/20/2013)  . GERD  (gastroesophageal reflux disease)   . H/O hiatal hernia   . History of gastric ulcer   . Arthritis     "generalized"  . Chronic lower back pain   . Anxiety   . Myocardial infarction ([place]) 09/2009; ~ 2014    [name] 10/24/2009;   . Lung cancer ([place]) dx'd 11/2005    non small cell    Assessment: 74 yoM with h/o lung cancer, Bell's palsy presents with weakness, cough with green sputum. CXR in ED shows = "Stable chest x-ray. No evidence of acute cardiopulmonary abnormality. No evidence of pneumonia". Patient has allergies to Cipro, Doxy, and Sulfa drugs.  Unasyn per pharmacy and azithromycin ordered for upper respiratory tract infection.   Today, 02/10/2015: - WBC WNL - AKI, SCr 1.26, CrCl~55 ml/min - Afebrile  Antimicrobials this admission: 12/18 Azithromycin >>  12/18 Unasyn >>   Levels/dose changes this admission: -  Microbiology Results: Sputum: ordered  Goal of Therapy:  Doses adjusted per renal function Eradication of infection  Plan:  1.  Unasyn 1.5g IV q6h. 2.  F/u SCr, clinical course.  [name] 02/10/2015,1:36 PM

## 2015-02-10 NOTE — H&P (Signed)
Patient Demographics:    [name], is a 81 y.o. male  MRN: [id number]   DOB - 08-04-1933  Admit Date - 02/10/2015  Outpatient Primary MD for the patient is Curly Rim, MD   With History of -  Past Medical History  Diagnosis Date  . CAD (coronary artery disease)     a. s/p DES-LCx 2006 b. repeat cath 2011- 90% distal LCx ISR, 100% mid RCA CTO, diffuse nonobstructive dz, medical management c. 07/2012 cath- 100% distal LCx ISR, 100% mid RCA CTO, nonobstructive dz; EF 40-45%  . PVC (premature ventricular contraction)   . HTN (hypertension)   . Paroxysmal a-fib ([place])   . PAD (peripheral artery disease) ([facility])     history of stenting in the past  . COPD (chronic obstructive pulmonary disease) ([place])   . LV dysfunction   . Anemia, iron deficiency   . Family history of anesthesia complication     "daughter gets PONV" (11/20/2013)  . Anginal pain ([place])   . Pneumonia 1-2 X's  . History of blood transfusion     "he's had several; probably related to his cancer" (11/20/2013)  . GERD (gastroesophageal reflux disease)   . H/O hiatal hernia   . History of gastric ulcer   . Arthritis     "generalized"  . Chronic lower back pain   . Anxiety   . Myocardial infarction ([place]) 09/2009; ~ 2014    [name] 10/24/2009;   . Lung cancer ([place]) dx'd 11/2005    non small cell      Past Surgical History  Procedure Laterality Date  . Lobectomy Left     left upper  . Appendectomy    . Anterior cervical decomp/discectomy fusion    . Twenty four hour intraesophageal ph profile.  05/02/2001  . Aortogram with bilateral lower extremity runoff.  07/11/2003  . Fiberoptic bronchoscopy, mediastinum.  10/20/2005  . Video assisted thoracoscopy Left 12/01/2005  . Esophagogastroduodenoscopy with biopsy.  04/14/2010  . Iliac artery  stent Right 11/20/2013  . Tonsillectomy  1972  . Inguinal hernia repair Bilateral     "I've had several"  . Back surgery      "I've had several back surgeries; got spacers down in lower back" (11/20/2013)  . Cataract extraction w/ intraocular lens  implant, bilateral Bilateral   . Coronary angioplasty with stent placement  2006    DES to LCX/notes 01/04/2014  . Cardiac catheterization  2011; 07/2012    [name] 01/04/2014  . Femoral artery stent Left 01/04/2014  . Left heart catheterization with coronary angiogram N/A 08/19/2012    Procedure: LEFT HEART CATHETERIZATION WITH CORONARY ANGIOGRAM;  Surgeon: Burnell Blanks, MD;  Location: [facility] CATH LAB;  Service: Cardiovascular;  Laterality: N/A;  . Lower extremity angiogram N/A 11/20/2013    Procedure: LOWER EXTREMITY ANGIOGRAM;  Surgeon: Lorretta Harp, MD;  Location: [facility] CATH LAB;  Service: Cardiovascular;  Laterality: N/A;  .  Lower extremity angiogram N/A 01/04/2014    Procedure: LOWER EXTREMITY ANGIOGRAM;  Surgeon: Lorretta Harp, MD;  Location: [facility] CATH LAB;  Service: Cardiovascular;  Laterality: N/A;    in for   Chief Complaint  Patient presents with  . Weakness      HPI:    [name]  is a 81 y.o. male, coronary artery disease all is with Dr. Renato Battles he, PAD with left femoral stent in the past follows with Dr. 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 Valetta Close, concerns disease follows with [place] neurology, generalized weakness, dysphagia, chronic systolic heart failure with last EF 35-40%, COPD, paroxysmal atrial fibrillation, essential hypertension, PVCs, history of lung cancer status post resection several years ago who lives at home with family, was recently diagnosed with right-sided Bell's palsy with right eye ptosis, right facial droop and dysphagia requiring rehabilitation admission for a month, discharged home about  2 months ago. He's been having problems with swallowing food or liquids, has developed a mild cough for the last week, comes to the ER with worsening generalized weakness, dry sounding cough.  In the ER workup suggestive of acute renal failure, dehydration, hypokalemia, acute bronchitis was his early community-acquired pneumonia, mildly elevated troponin, his case was discussed with cardiologist Dr. Luther Parody and I was requested to admit. Besides above dictated complains all other review of systems is negative.    Review of systems:    In addition to the HPI above,   No Fever-chills, No Headache, No changes with Vision or hearing, Does have problems or lying food or liquids, No Chest pain, positive cough and shortness of breath, No Abdominal pain, No Nausea or Vommitting, Bowel movements are regular, No Blood in stool or Urine, No dysuria, No new skin rashes or bruises, No new joints pains-aches,  No new weakness, tingling, numbness in any extremity, is attentive generalized weakness and deconditioning No recent weight gain or loss, No polyuria, polydypsia or polyphagia, No significant Mental Stressors.  A full 10 point Review of Systems was done, except as stated above, all other Review of Systems were negative.    Social History:     Social History  Substance Use Topics  . Smoking status: Former Smoker -- 1.00 packs/day for 65 years    Types: Cigarettes    Quit date: 11/20/2014  . Smokeless tobacco: Never Used  . Alcohol Use: Yes     Comment: quit 40+ years ago    Lives - at home and ambulates with assistance      Family History :     Family History  Problem Relation Age of Onset  . Heart Problems Mother   . Breast cancer Mother   . Other Father     Motorcycle Accident  . Breast cancer Sister        Home Medications:   Prior to Admission medications   Medication Sig Start Date End Date Taking? Authorizing Provider  acetaminophen (TYLENOL) 325 MG tablet Take 2  tablets (650 mg total) by mouth every 4 (four) hours as needed for headache or mild pain. 01/05/14  Yes Luke K Kilroy, PA-C  albuterol (PROVENTIL) (2.5 MG/3ML) 0.083% nebulizer solution Take 2.5 mg by nebulization every 4 (four) hours as needed for wheezing or shortness of breath.  01/22/15  Yes Historical Provider, MD  amiodarone (PACERONE) 100 MG tablet Take 100 mg by mouth at bedtime.   Yes Historical Provider, MD  amLODipine (NORVASC) 10 MG tablet Take 5 mg by mouth every morning.  09/25/14  Yes Historical Provider, MD  aspirin 81 MG chewable tablet Chew 1 tablet (81 mg total) by mouth daily. 01/05/14  Yes Erlene Quan, PA-C  clopidogrel (PLAVIX) 75 MG tablet Take 1 tablet (75 mg total) by mouth daily. 01/22/15  Yes Luke K Kilroy, PA-C  diazepam (VALIUM) 2 MG tablet Take 1 tablet (2 mg total) by mouth every 8 (eight) hours as needed for muscle spasms. 11/30/14  Yes Shanker Kristeen Mans, MD  erythromycin ophthalmic ointment Place 1 application into the right eye 3 (three) times daily.   Yes Historical Provider, MD  feeding supplement (BOOST / RESOURCE BREEZE) LIQD Take 1 Container by mouth daily. 11/30/14  Yes Shanker Kristeen Mans, MD  ferrous sulfate 325 (65 FE) MG tablet Take 325 mg by mouth 3 (three) times daily as needed (for low iron, patient preference).  11/13/13  Yes Modena Jansky, MD  fexofenadine (ALLEGRA) 180 MG tablet Take 180 mg by mouth daily as needed (allergies).    Yes Historical Provider, MD  HYDROcodone-acetaminophen (NORCO/VICODIN) 5-325 MG tablet Take 1 tablet by mouth 2 (two) times daily as needed for moderate pain.   Yes Historical Provider, MD  isosorbide mononitrate (IMDUR) 60 MG 24 hr tablet Take 1 tablet (60 mg total) by mouth at bedtime. Patient taking differently: Take 60 mg by mouth every morning.  07/17/14  Yes Peter M Martinique, MD  lactose free nutrition (BOOST PLUS) LIQD Take 237 mLs by mouth 3 (three) times daily after meals. 11/30/14  Yes Shanker Kristeen Mans, MD  losartan  (COZAAR) 100 MG tablet Take 100 mg by mouth at bedtime.    Yes Historical Provider, MD  megestrol (MEGACE ES) 625 MG/5ML suspension Take 5 mLs (625 mg total) by mouth daily. 01/22/15  Yes Curt Bears, MD  nitroGLYCERIN (NITROSTAT) 0.4 MG SL tablet Place 1 tablet (0.4 mg total) under the tongue every 5 (five) minutes as needed for chest pain (Chest pain). 11/29/13  Yes Burtis Junes, NP  pantoprazole (PROTONIX) 40 MG tablet TAKE 1 TABLET ONCE A DAY 04/16/14  Yes Lorretta Harp, MD  polyethylene glycol [facility] / GLYCOLAX) packet Take 17 g by mouth daily.    Yes Historical Provider, MD  PROAIR HFA 108 (90 BASE) MCG/ACT inhaler Inhale 2 puffs into the lungs every 4 (four) hours as needed for wheezing or shortness of breath.  11/21/14  Yes Historical Provider, MD  amiodarone (PACERONE) 200 MG tablet TAKE 1 TABLET DAILY Patient not taking: Reported on 02/10/2015 01/07/15   Peter M Martinique, MD  carbidopa-levodopa (SINEMET IR) 25-100 MG tablet Take 1 tablet by mouth 3 (three) times daily. Reported on 02/10/2015    Historical Provider, MD  lidocaine (XYLOCAINE) 2 % solution Use as directed 15 mLs in the mouth or throat every 6 (six) hours as needed for mouth pain. Patient not taking: Reported on 02/10/2015 11/30/14   Jonetta Osgood, MD  nystatin (MYCOSTATIN) 100000 UNIT/ML suspension Take 5 mLs (500,000 Units total) by mouth 4 (four) times daily. Patient not taking: Reported on 02/10/2015 11/30/14   Jonetta Osgood, MD     Allergies:     Allergies  Allergen Reactions  . Ciprofloxacin Other (See Comments)    Joint pain  . Doxycycline Other (See Comments)    arthralgias  . Atorvastatin Other (See Comments)    Joint swelling  . Sulfa Drugs Cross Reactors Rash     Physical  Exam:   Vitals  Blood pressure 168/66, pulse 72, temperature 97.6 F (36.4 C), temperature source Oral, resp. rate 14, height '6\' 3"'$  (1.905 m), weight 83.008 kg (183 lb), SpO2 98 %.   1. General elderly white male  lying in hospital bed, appears dehydrated and sick,  2. Normal affect and insight, Not Suicidal or Homicidal, Awake Alert, Oriented X 3.  3. No F.N deficits, ALL C.Nerves Intact, except right facial nerve palsy with left-sided facial droop and right eye ptosis, Strength 5/5 all 4 extremities, Sensation intact all 4 extremities, Plantars down going.  4. Earsappear Normal, right eye is sutured closed, there is brown crust around the right eye. Oral mucosa is dry,  5. Supple Neck, No JVD, No cervical lymphadenopathy appriciated, No Carotid Bruits.  6. Symmetrical Chest wall movement, Good air movement bilaterally, coarse bilateral breath sounds  7. RRR, No Gallops, Rubs or Murmurs, No Parasternal Heave.  8. Positive Bowel Sounds, Abdomen Soft, No tenderness, No organomegaly appriciated,No rebound -guarding or rigidity.  9.  No Cyanosis, Normal Skin Turgor, No Skin Rash or Bruise.  10. Good muscle tone,  joints appear normal , no effusions, Normal ROM.  11. No Palpable Lymph Nodes in Neck or Axillae      Data Review:    CBC  Recent Labs Lab 02/10/15 0900  WBC 8.4  HGB 11.8*  HCT 35.6*  PLT 211  MCV 92.2  MCH 30.6  MCHC 33.1  RDW 14.4  LYMPHSABS 1.4  MONOABS 1.0  EOSABS 0.3  BASOSABS 0.0   ------------------------------------------------------------------------------------------------------------------  Chemistries   Recent Labs Lab 02/10/15 0900  NA 141  K 2.7*  CL 103  CO2 31  GLUCOSE 101*  BUN 21*  CREATININE 1.26*  CALCIUM 12.1*  AST 24  ALT 29  ALKPHOS 85  BILITOT 0.5   ------------------------------------------------------------------------------------------------------------------ estimated creatinine clearance is 55.8 mL/min (by C-G formula based on Cr of 1.26). ------------------------------------------------------------------------------------------------------------------ No results for input(s): TSH, T4TOTAL, T3FREE, THYROIDAB in the last 72  hours.  Invalid input(s): FREET3   Coagulation profile No results for input(s): INR, PROTIME in the last 168 hours. ------------------------------------------------------------------------------------------------------------------- No results for input(s): DDIMER in the last 72 hours. -------------------------------------------------------------------------------------------------------------------  Cardiac Enzymes No results for input(s): CKMB, TROPONINI, MYOGLOBIN in the last 168 hours.  Invalid input(s): CK ------------------------------------------------------------------------------------------------------------------ Invalid input(s): POCBNP   ---------------------------------------------------------------------------------------------------------------  Urinalysis    Component Value Date/Time   COLORURINE YELLOW 02/10/2015 1053   APPEARANCEUR CLOUDY* 02/10/2015 1053   LABSPEC 1.014 02/10/2015 1053   PHURINE 7.0 02/10/2015 [place] 02/10/2015 [place] 02/10/2015 [place] 02/10/2015 1053   KETONESUR NEGATIVE 02/10/2015 1053   PROTEINUR NEGATIVE 02/10/2015 1053   UROBILINOGEN 1.0 01/06/2014 0338   NITRITE NEGATIVE 02/10/2015 1053   LEUKOCYTESUR NEGATIVE 02/10/2015 1053    ----------------------------------------------------------------------------------------------------------------   Imaging Results:    Dg Chest 2 View  02/10/2015  CLINICAL DATA:  Increasing weakness, low back pain and lower abdominal pain, productive cough with green sputum. History of lung cancer in 2008 status post chemotherapy and radiation therapy. EXAM: CHEST  2 VIEW COMPARISON:  Chest CT dated 01/16/2015 and chest x-ray dated 11/22/2014. FINDINGS: Heart size is upper normal, unchanged. Overall cardiomediastinal silhouette is stable in size and configuration. Surgical clips again noted in the left hilum related to patient's previous partial left  lung resection. The chronic pleural-parenchymal thickening/fibrosis at the left lung apex is unchanged. Additional mild scarring/fibrosis at the left lung base is unchanged. No new  lung findings seen. Again noted is the diffuse DISH throughout the slightly kyphotic thoracic spine. Osseous structures about the chest are otherwise unremarkable. IMPRESSION: Stable chest x-ray. No evidence of acute cardiopulmonary abnormality. No evidence of pneumonia. Electronically Signed   By: Franki Cabot M.D.   On: 02/10/2015 10:43    My personal review of EKG: Rhythm NSR, non specific ST changes   Assessment & Plan:     1. Early community-acquired pneumonia suspicious for ongoing microaspiration. Admit to telemetry bed, dysphagia 3 to thick liquid diet with reading assistance aspiration precautions, speech therapy to evaluate, cover with Unasyn and azithromycin. Check sputum cultures. Check blood culture.   2. Generalized weakness and deconditioning. Due to #1 above, supportive care as above. PT evaluation.   3. Concerns disease. Currently off medications, follows with Dr. Bonnita Levan at [facility] neurology, outpatient follow-up post discharge.   4. Ramsay Hunt syndrome with right-sided facial droop and ptosis. Follows with ophthalmologist Dr. Luvenia Heller, right eyelid has been sutured, continue supportive care outpatient follow-up with his ophthalmologist after discharge.   5. Dehydration, acute renal failure and hypokalemia. Hydrate, replace potassium, repeat BMP in the morning.   6. Essential hypertension. Continue home medications, hold ACE/ARB due to acute renal failure, as needed IV hydralazine.   7. CAD-PAD with left femoral stent. Mildly elevated troponin, likely demand ischemia from #1 above along with dehydration and acute renal failure, EKG nonacute, no chest pain. We will trend troponin, check echogram, continue aspirin and Plavix, place on low-dose Coreg, cardiology has been consulted. Telemetry  monitor.   8. Ischemic cardiomyopathy with chronic systolic heart failure EF 35-40%. Currently appears dehydrated, gentle IV fluids, hold ARB due to acute renal failure, continue Imdur, have added hydralazine, added low-dose Coreg, telemetry monitor.   9. Paroxysmal atrial fibrillation. [place] fast score 4 or higher. Currently in sinus, continue amiodarone, not on anticoagulation per primary cardiologist likely due to poor balance and deconditioning, telemetry monitor, R Georgie consulted.   10. Dysphagia. Speech evaluation, dysphagia 3 diet with nectar thick liquids, feeding assistance aspiration precautions. Family clearly counseled about poor prognosis associated with this problem.    DVT Prophylaxis Heparin   AM Labs Ordered, also please review Full Orders  Family Communication: Admission, patients condition and plan of care including tests being ordered have been discussed with the patient and daughter/POA who indicate understanding and agree with the plan and Code Status.  Code Status Full  Likely DC to  TBD  Condition GUARDED    Time spent in minutes : 35    SINGH,PRASHANT K M.D on 02/10/2015 at 1:59 PM  Between 7am to 7pm - Pager - [phone]  After 7pm go to [email or web address] - password [facility]  [facility] - Office  [phone]

## 2015-02-10 NOTE — ED Notes (Signed)
Patient has had increasing weakness over past week with worsening symptoms over last two days. Family concerned for UTI as pt has had lower back, lower abdominal pains, low urinary volume, and cough with green sputum. Pt has hx of bells palsy, shingles, cardiac stents, DVT, leg stent, lung cancer, right lung partially removed, cancer remission for 10 years. Appetite diminished significantly. Patient is only drinking ensure at home. Lives with daughter.

## 2015-02-11 ENCOUNTER — Inpatient Hospital Stay (HOSPITAL_COMMUNITY): Payer: Medicare Other

## 2015-02-11 ENCOUNTER — Other Ambulatory Visit (HOSPITAL_COMMUNITY): Payer: Medicare Other

## 2015-02-11 DIAGNOSIS — I255 Ischemic cardiomyopathy: Secondary | ICD-10-CM

## 2015-02-11 DIAGNOSIS — R0600 Dyspnea, unspecified: Secondary | ICD-10-CM

## 2015-02-11 LAB — CBC
HEMATOCRIT: 32.5 % — AB (ref 39.0–52.0)
HEMOGLOBIN: 10.9 g/dL — AB (ref 13.0–17.0)
MCH: 30.8 pg (ref 26.0–34.0)
MCHC: 33.5 g/dL (ref 30.0–36.0)
MCV: 91.8 fL (ref 78.0–100.0)
Platelets: 207 10*3/uL (ref 150–400)
RBC: 3.54 MIL/uL — AB (ref 4.22–5.81)
RDW: 14.3 % (ref 11.5–15.5)
WBC: 7.6 10*3/uL (ref 4.0–10.5)

## 2015-02-11 LAB — BASIC METABOLIC PANEL
ANION GAP: 6 (ref 5–15)
BUN: 21 mg/dL — ABNORMAL HIGH (ref 6–20)
CALCIUM: 11.6 mg/dL — AB (ref 8.9–10.3)
CHLORIDE: 109 mmol/L (ref 101–111)
CO2: 26 mmol/L (ref 22–32)
Creatinine, Ser: 1.03 mg/dL (ref 0.61–1.24)
GFR calc non Af Amer: >60 mL/min (ref >60)
Glucose, Bld: 91 mg/dL (ref 65–99)
POTASSIUM: 3.4 mmol/L — AB (ref 3.5–5.1)
Sodium: 141 mmol/L (ref 135–145)

## 2015-02-11 LAB — T4, FREE: Free T4: 2.96 ng/dL — ABNORMAL HIGH (ref 0.61–1.12)

## 2015-02-11 LAB — MAGNESIUM: Magnesium: 1.6 mg/dL — ABNORMAL LOW (ref 1.7–2.4)

## 2015-02-11 LAB — TROPONIN I: Troponin I: 0.06 ng/mL — ABNORMAL HIGH (ref <0.031)

## 2015-02-11 MED ORDER — POTASSIUM CHLORIDE 10 MEQ/100ML IV SOLN
10.0000 meq | INTRAVENOUS | Status: AC
Start: 2015-02-11 — End: 2015-02-11
  Administered 2015-02-11 (×2): 10 meq via INTRAVENOUS
  Filled 2015-02-11 (×2): qty 100

## 2015-02-11 MED ORDER — BOOST / RESOURCE BREEZE PO LIQD
1.0000 | Freq: Three times a day (TID) | ORAL | Status: DC
  Administered 2015-02-11 – 2015-02-14 (×9): 1 via ORAL

## 2015-02-11 MED ORDER — PRO-STAT SUGAR FREE PO LIQD
30.0000 mL | Freq: Two times a day (BID) | ORAL | Status: DC
  Administered 2015-02-11 – 2015-02-12 (×2): 30 mL via ORAL
  Filled 2015-02-11 (×2): qty 30

## 2015-02-11 NOTE — Progress Notes (Signed)
PT Cancellation Note  Patient Details Name: [name] MRN: [id number] DOB: [birth year]   Cancelled Treatment:    Reason Eval/Treat Not Completed: Family requested PT check back later. Will attempt to check back as schedule allows. Thanks.    Weston Anna, MPT Pager: [phone]

## 2015-02-11 NOTE — Evaluation (Addendum)
Clinical/Bedside Swallow Evaluation Patient Details  Name: [name] MRN: [id number] Date of Birth: February 25, 1933  Today's Date: 02/11/2015 Time: SLP Start Time (ACUTE ONLY): 1000 SLP Stop Time (ACUTE ONLY): 1050 SLP Time Calculation (min) (ACUTE ONLY): 50 min  Past Medical History:  Past Medical History  Diagnosis Date  . CAD (coronary artery disease)     a. s/p DES-LCx 2006 b. repeat cath 2011- 90% distal LCx ISR, 100% mid RCA CTO, diffuse nonobstructive dz, medical management c. 07/2012 cath- 100% distal LCx ISR, 100% mid RCA CTO, nonobstructive dz; EF 40-45%  . PVC (premature ventricular contraction)   . HTN (hypertension)   . Paroxysmal a-fib ([place])   . PAD (peripheral artery disease) ([facility])     history of stenting in the past  . COPD (chronic obstructive pulmonary disease) ([place])   . LV dysfunction   . Anemia, iron deficiency   . Family history of anesthesia complication     "daughter gets PONV" (11/20/2013)  . Anginal pain ([place])   . Pneumonia 1-2 X's  . History of blood transfusion     "he's had several; probably related to his cancer" (11/20/2013)  . GERD (gastroesophageal reflux disease)   . H/O hiatal hernia   . History of gastric ulcer   . Arthritis     "generalized"  . Chronic lower back pain   . Anxiety   . Myocardial infarction ([place]) 09/2009; ~ 2014    [name] 10/24/2009;   . Lung cancer ([place]) dx'd 11/2005    non small cell   Past Surgical History:  Past Surgical History  Procedure Laterality Date  . Lobectomy Left     left upper  . Appendectomy    . Anterior cervical decomp/discectomy fusion    . Twenty four hour intraesophageal ph profile.  05/02/2001  . Aortogram with bilateral lower extremity runoff.  07/11/2003  . Fiberoptic bronchoscopy, mediastinum.  10/20/2005  . Video assisted thoracoscopy Left 12/01/2005  . Esophagogastroduodenoscopy with biopsy.  04/14/2010  . Iliac artery stent Right 11/20/2013  . Tonsillectomy  1972  . Inguinal hernia repair  Bilateral     "I've had several"  . Back surgery      "I've had several back surgeries; got spacers down in lower back" (11/20/2013)  . Cataract extraction w/ intraocular lens  implant, bilateral Bilateral   . Coronary angioplasty with stent placement  2006    DES to LCX/notes 01/04/2014  . Cardiac catheterization  2011; 07/2012    [name] 01/04/2014  . Femoral artery stent Left 01/04/2014  . Left heart catheterization with coronary angiogram N/A 08/19/2012    Procedure: LEFT HEART CATHETERIZATION WITH CORONARY ANGIOGRAM;  Surgeon: Burnell Blanks, MD;  Location: [facility] CATH LAB;  Service: Cardiovascular;  Laterality: N/A;  . Lower extremity angiogram N/A 11/20/2013    Procedure: LOWER EXTREMITY ANGIOGRAM;  Surgeon: Lorretta Harp, MD;  Location: [facility] CATH LAB;  Service: Cardiovascular;  Laterality: N/A;  . Lower extremity angiogram N/A 01/04/2014    Procedure: LOWER EXTREMITY ANGIOGRAM;  Surgeon: Lorretta Harp, MD;  Location: [facility] CATH LAB;  Service: Cardiovascular;  Laterality: N/A;   HPI:  81 yo male adm to [facility] with cough, AMS, weakness - Pt diagnosed with bronchitis.  PMH + for tremors tx with carb/levo per notes, lung cancer s/p surgery and chemoradiation, Ramsay Hunt, Bell's palsy, facial droop, dysphagia, rehab stay.  Swallow evaluation ordered. CXR negative for acute pna.    Assessment / Plan / Recommendation Clinical Impression  Pt currently  presents with clinical indications of severe dysphagia/aspiration across all consistencies evaluated.  He is grossly weak with poor cough mechanisim.  Bell's Palsy contributes to his dysphagia resulting in decreased labial closure and buccal pocketing of solids requiring icecream to help transit.  Consistent cough noted s/p swallow with all consistencies.  Multiple sub-swallows noted across consistencies concerning for gross residuals. Overt coughing with expectoration of white secretions mixed with icecream and peaches noted within 2 minutes of pt  swallowing.  Suspect gross pharyngeal residuals and aspiration resulting in reflexive coughing.   Daughter in Psychiatric nurse and reports pt at times will require a "pat on the back" to help clear airway with and without eating.  Per interview, pt has been demonstrating coughing with and without expectoration over a few months- causing concern for swallow prognosis.    MBS not indicated as it will not change pt's outcomes and there is not a consistency at this time that this pt will not aspirate.  Educated pt/Crystal to results/recommendations.    If pt desires to continue po intake - he may tolerate liquids better due to gross weakness.  Of note, pt is a full code - advised pt/daughter in law to concerns re: level of dysphagia and aspiration.  Secretion aspiration present at this time and can not be functionally prevented.   Demonstrated use of oral suction to daughter in law.    Will follow up to help with care plan.     Aspiration Risk  Risk for inadequate nutrition/hydration;Severe aspiration risk    Diet Recommendation NPO (or po diet with accepted aspiration risks if MD/pt desires)        Other  Recommendations Oral Care Recommendations: Oral care QID Other Recommendations: Have oral suction available   Follow up Recommendations  None    Frequency and Duration min 1 x/week  1 week       Prognosis Prognosis for Safe Diet Advancement: Guarded Barriers to Reach Goals: Severity of deficits;Time post onset      Swallow Study   General Date of Onset: 02/11/15 HPI: 81 yo male adm to [facility] with cough, AMS, weakness - Pt diagnosed with bronchitis.  PMH + for tremors tx with carb/levo per notes, lung cancer s/p surgery and chemoradiation, Ramsay Hunt, Bell's palsy, facial droop, dysphagia, rehab stay.  Swallow evaluation ordered. CXR negative for acute pna.  Type of Study: Bedside Swallow Evaluation Diet Prior to this Study: Regular;Thin liquids Temperature Spikes Noted: No Respiratory  Status: Nasal cannula History of Recent Intubation: No Behavior/Cognition: Lethargic/Drowsy;Requires cueing (HOH) Oral Cavity Assessment: Erythema;Dried secretions Oral Care Completed by SLP: Yes Oral Cavity - Dentition: Dentures, top;Dentures, bottom Vision: Impaired for self-feeding Self-Feeding Abilities: Total assist Patient Positioning: Upright in bed Baseline Vocal Quality: Hoarse;Low vocal intensity Volitional Cough: Weak Volitional Swallow: Able to elicit    Oral/Motor/Sensory Function Overall Oral Motor/Sensory Function: Severe impairment (gross weakness in addition to cn deficits due to bell's palsy) Facial ROM: Suspected CN VII (facial) dysfunction Facial Strength: Suspected CN VII (facial) dysfunction   Ice Chips Ice chips: Not tested   Thin Liquid Thin Liquid: Impaired Presentation: Straw;Spoon Oral Phase Impairments: Reduced labial seal;Reduced lingual movement/coordination;Poor awareness of bolus Oral Phase Functional Implications: Prolonged oral transit Pharyngeal  Phase Impairments: Suspected delayed Swallow;Decreased hyoid-laryngeal movement;Multiple swallows;Cough - Delayed;Cough - Immediate    Nectar Thick Nectar Thick Liquid: Not tested   Honey Thick Honey Thick Liquid: Not tested   Puree Puree: Impaired (icecream) Presentation: Spoon Oral Phase Impairments: Reduced labial seal;Reduced lingual  movement/coordination Oral Phase Functional Implications: Prolonged oral transit Pharyngeal Phase Impairments: Suspected delayed Swallow;Decreased hyoid-laryngeal movement;Multiple swallows;Cough - Immediate;Cough - Delayed   Solid Solid: Impaired Presentation: Spoon Oral Phase Impairments: Reduced lingual movement/coordination;Impaired mastication;Reduced labial seal Oral Phase Functional Implications: Right lateral sulci pocketing;Prolonged oral transit;Impaired mastication;Oral residue Pharyngeal Phase Impairments: Suspected delayed Swallow;Multiple swallows;Cough -  Delayed;Cough - Immediate       Luanna Salk, MS [facility] SLP [phone]     Pt reports he is not interested in therapy as "look at the condition I'm in" Note diet order changed to clears - phoned Crystal in room to make her aware of diet change at 1123.  She verbalized back "clear liquids only".

## 2015-02-11 NOTE — Progress Notes (Signed)
Initial Nutrition Assessment  DOCUMENTATION CODES:   Not applicable  INTERVENTION:  - Will d/c Boost Plus given diet order of CLD - Will increase Boost Breeze to TID, each supplement provides 250 kcal and 9 grams of protein - Will order 30 mL Prostat BID, each supplement provides 100 kcal and 15 grams of protein. - RD will continue to monitor for needs  NUTRITION DIAGNOSIS:   Inadequate oral intake related to poor appetite as evidenced by per patient/family report.  GOAL:   Patient will meet greater than or equal to 90% of their needs  MONITOR:   PO intake, Supplement acceptance, Weight trends, Labs, Skin, I & O's  REASON FOR ASSESSMENT:   Malnutrition Screening Tool  ASSESSMENT:   81 y.o. male, coronary artery disease all is with Dr. Renato Battles he, PAD with left femoral stent in the past follows with Dr. 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 Valetta Close, concerns disease follows with [place] [facility], generalized weakness, dysphagia, chronic systolic heart failure with last EF 35-40%, COPD, paroxysmal atrial fibrillation, essential hypertension, PVCs, history of lung cancer status post resection several years ago who lives at home with family, was recently diagnosed with right-sided Bell's palsy with right eye ptosis, right facial droop and dysphagia requiring rehabilitation admission for a month, discharged home about 2 months ago. He's been having problems with swallowing food or liquids, has developed a mild cough for the last week, comes to the ER with worsening generalized weakness, dry sounding cough.  Pt seen for MST. BMI indicates normal weight status. Per chart review, pt ate 25% of breakfast this AM which family member reports was 2 bites of grits and 2 bites of eggs. Pt sleeping at time of RD visit and all information was provided by daughter-in-law who is at bedside.  She states that pt's appetite has been poor for "awhile." She is unable to further specify on time frame for poor intakes. She states that pt will sometimes refuse intakes altogether and other times he is willing to take a few bites. She states that recently pt had been complaining of abdominal pain with intakes which she and other family members felt may be related to constipation but that constipation has since resolved.  She states that PTA pt was having difficulty with chewing and swallowing and family felt pt may be aspirating; daughter-in-law states that SLP saw pt earlier in the day and confirmed that pt is aspirating. Please see SLP note from today at 1108 for further information.  PTA, pt was drinking 1 or more bottles of Ensure which was mixed: half Ensure and half milk.  Daughter-in-law is unsure of weight PTA but feels pt has been losing weight with poor intakes. No weight hx available in chart prior to this admission. Physical assessment not done at this time for pt comfort as he was sleeping. Unable to state malnutrition currently but is likely present; will complete physical assessment at follow-up and document accordingly.   Not meeting needs. Will change supplements as outlined above based on current diet order and monitor for changes to PO diet or TF per family wished; Palliative Care consult is pending. Medications reviewed. Labs reviewed; K: 3.4 mmol/L, BUN elevated, Ca: 11.6 mg/dL, Mg: 1.6 mg/dL.   Diet Order:  Diet clear liquid Room service appropriate?: Yes; Fluid consistency:: Thin  Skin:  Reviewed, no issues  Last BM:  PTA  Height:   Ht  Readings from Last 1 Encounters:  02/10/15 '6\' 3"'$  (1.905 m)    Weight:   Wt Readings from Last 1 Encounters:  02/11/15 162 lb 0.6 oz (73.5 kg)    Ideal Body Weight:  89.09 kg (kg)  BMI:  Body mass index is 20.25 kg/(m^2).  Estimated Nutritional Needs:   Kcal:  1615-1830 (22-25 kcal/kg)  Protein:  65-75 grams  Fluid:  2-2.3  L/day  EDUCATION NEEDS:   No education needs identified at this time     Jarome Matin, RD, LDN Inpatient Clinical Dietitian Pager # [phone] After hours/weekend pager # [phone]

## 2015-02-11 NOTE — Progress Notes (Signed)
*  PRELIMINARY RESULTS* Echocardiogram 2D Echocardiogram has been performed.  [name] 02/11/2015, 2:57 PM

## 2015-02-11 NOTE — Progress Notes (Signed)
PT Cancellation Note  Patient Details Name: [name] MRN: [id number] DOB: [birth year]   Cancelled Treatment:    Reason Eval/Treat Not Completed: Patient at procedure or test/unavailable. Will check back another day to attempt to complete PT eval.  Spoke with family who is unsure if pt will offer much participation.    Weston Anna, MPT Pager: [phone]

## 2015-02-11 NOTE — Progress Notes (Addendum)
[facility]                                                                              Patient Demographics  [name], is a 81 y.o. male, DOB - [birth year], IPJ:[id number]  Admit date - 02/10/2015   Admitting Physician Thurnell Lose, MD  Outpatient Primary MD for the patient is Curly Rim, MD  LOS - 1   Chief Complaint  Patient presents with  . Weakness       Brief HPI   [name] is a 81 y.o. Male with CAD, PAD with left femoral stent in the past follows with Dr. 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 Valetta Close, generalized weakness, dysphagia, chronic systolic heart failure with last EF 35-40%, COPD, paroxysmal atrial fibrillation, essential hypertension, lung cancer status post resection several years ago who lives at home with family, was recently diagnosed with right-sided Bell's palsy with right eye ptosis, right facial droop and dysphagia requiring rehabilitation admission for a month, discharged home about 2 months ago. He's been having problems with swallowing food or liquids, developed a mild cough for the last week, presented to ED with worsening generalized weakness, dry sounding cough. In the ER workup suggestive of acute renal failure, dehydration, hypokalemia, acute bronchitis or early community-acquired pneumonia, mildly elevated troponin. Cardiology was consulted, Dr Marlou Porch.  Assessment & Plan    Principal Problem:   Acute bronchitis with early pneumonia possibly aspiration, acute hypoxic respiratory failure - Speech therapy consulted, continue IV Unasyn, Zithromax - Follow sputum cultures, blood cultures, continue O2  Addendum: 11:16 AM Swallow evaluation done, severe dysphagia with aspiration, grossly weak with poor cough mechanism.  MBS not indicated as it will not change patient's outcome and there is not a consistent see  that patient will not aspirate. Speech therapy recommending liquids at this time. I have requested palliative consult for goals of care. Change diet to clear liquids for now.  Active Problems:   CAD- CFX PCI '06, CTO RCA, distal CFX with mildly elevated troponins - Currently having no chest pain, has dyspnea, could be due to pneumonia, follow 2-D echocardiogram - Cardiology was consulted, await further recommendations - Continue aspirin, Plavix, Coreg, Imdur  Generalized deconditioning/failure to thrive - PTOT evaluation pending, may need placement    Ramsay Hunt syndrome with right-sided facial droop and ptosis. - Follows with ophthalmologist Dr. Luvenia Heller, right eyelid has been sutured - continue supportive care, outpatient follow-up with his ophthalmologist after discharge.  Ischemic cardiomyopathy with chronic systolic heart failure EF 35-40% - Follow 2-D echocardiogram, continue Imdur, low-dose Coreg, hold ARB due to AKI    Paroxysmal a-fib- NSR on Amiodarone - Continue amiodarone, not on anticoagulation for cardiology due to high fall risk, poor balance and deconditioning. - Cardiology consulted  Dysphagia - Speech therapy evaluation pending  Hypokalemia Replaced  Low TSH - Follow T4, T3   Code Status: full code  Family Communication: Discussed in detail with the patient, all imaging results, lab results explained to the patient and daughter-in-law at the bedside   Disposition Plan:  Time Spent in minutes   25 minutes  Procedures  Chest x-ray  Consults   Cardiology  DVT Prophylaxis  heparin   Medications  Scheduled Meds: . amiodarone  100 mg Oral QHS  . amLODipine  5 mg Oral q morning - 10a  . ampicillin-sulbactam (UNASYN) IV  1.5 g Intravenous Q6H  . antiseptic oral rinse  7 mL Mouth Rinse BID  . aspirin  81 mg Oral Daily  . azithromycin  500 mg Intravenous Q24H  . carvedilol  3.125 mg Oral BID WC  . clopidogrel  75 mg Oral Daily  . erythromycin  1  application Right Eye TID  . feeding supplement  1 Container Oral Q24H  . heparin  5,000 Units Subcutaneous 3 times per day  . hydrALAZINE  25 mg Oral 3 times per day  . isosorbide mononitrate  60 mg Oral q morning - 10a  . lactose free nutrition  237 mL Oral TID PC  . loratadine  10 mg Oral Daily  . megestrol  625 mg Oral Daily  . pantoprazole  40 mg Oral Daily  . polyethylene glycol  17 g Oral Daily  . sodium chloride  3 mL Intravenous Q12H   Continuous Infusions: . dextrose 5 % and 0.9 % NaCl with KCl 40 mEq/L 100 mL/hr at 02/11/15 0555   PRN Meds:.acetaminophen, albuterol, albuterol, alum & mag hydroxide-simeth, bisacodyl, diazepam, ferrous sulfate, guaiFENesin-dextromethorphan, hydrALAZINE, HYDROcodone-acetaminophen, nitroGLYCERIN, ondansetron **OR** ondansetron (ZOFRAN) IV   Antibiotics   Anti-infectives    Start     Dose/Rate Route Frequency Ordered Stop   02/10/15 2200  ampicillin-sulbactam (UNASYN) 1.5 g in sodium chloride 0.9 % 50 mL IVPB     1.5 g 100 mL/hr over 30 Minutes Intravenous Every 6 hours 02/10/15 1401     02/10/15 1430  ampicillin-sulbactam (UNASYN) 1.5 g in sodium chloride 0.9 % 50 mL IVPB     1.5 g 100 mL/hr over 30 Minutes Intravenous  Once 02/10/15 1401 02/10/15 1813   02/10/15 1400  azithromycin (ZITHROMAX) 500 mg in dextrose 5 % 250 mL IVPB     500 mg 250 mL/hr over 60 Minutes Intravenous Every 24 hours 02/10/15 1350          Subjective:   [name] was seen and examined today. Very deconditioned, weak. Denies any chest pain. Overnight had hypoxia, O2 sats dropped to 70s, patient was placed on 4 L O2 via nasal cannula, currently O2 sats 100%.  No nausea, vomiting, abdominal pain, any diarrhea. No fevers or chills.  Objective:   Blood pressure 161/50, pulse 54, temperature 98.3 F (36.8 C), temperature source Oral, resp. rate 20, height '6\' 3"'$  (1.905 m), weight 73.5 kg (162 lb 0.6 oz), SpO2 99 %.  Wt Readings from Last 3 Encounters:    02/11/15 73.5 kg (162 lb 0.6 oz)  12/20/14 85.73 kg (189 lb)  12/19/14 85.821 kg (189 lb 3.2 oz)     Intake/Output Summary (Last 24 hours) at 02/11/15 0828 Last data filed at 02/11/15 0700  Gross per 24 hour  Intake 1678.33 ml  Output    202 ml  Net 1476.33 ml    Exam  General: Alert and oriented, NAD  HEENT:  right eye sutured closed  mucous membranes moist.   Neck: Supple, no JVD, no masses  CVS: S1 S2 auscultated, no rubs, murmurs or gallops. Regular rate and rhythm.  Respiratory: Decreased breath sound at the bases but no wheezing  Abdomen: Soft, nontender, nondistended, + bowel sounds  Ext: no cyanosis clubbing or edema  Neuro: right facial nerve palsy, right eye ptosis, Strength 5/5 upper and lower extremities bilaterally  Skin: No rashes  Psych: Normal affect and demeanor, alert and oriented    Data Review   Micro Results Recent Results (from the past 240 hour(s))  Culture, expectorated sputum-assessment     Status: None   Collection Time: 02/10/15  4:35 PM  Result Value Ref Range Status   Specimen Description SPUTUM  Final   Special Requests NONE  Final   Sputum evaluation   Final    THIS SPECIMEN IS ACCEPTABLE. RESPIRATORY CULTURE REPORT TO FOLLOW.   Report Status 02/10/2015 FINAL  Final    Radiology Reports Dg Chest 2 View  02/10/2015  CLINICAL DATA:  Increasing weakness, low back pain and lower abdominal pain, productive cough with green sputum. History of lung cancer in 2008 status post chemotherapy and radiation therapy. EXAM: CHEST  2 VIEW COMPARISON:  Chest CT dated 01/16/2015 and chest x-ray dated 11/22/2014. FINDINGS: Heart size is upper normal, unchanged. Overall cardiomediastinal silhouette is stable in size and configuration. Surgical clips again noted in the left hilum related to patient's previous partial left lung resection. The chronic pleural-parenchymal thickening/fibrosis at the left lung apex is unchanged. Additional mild  scarring/fibrosis at the left lung base is unchanged. No new lung findings seen. Again noted is the diffuse DISH throughout the slightly kyphotic thoracic spine. Osseous structures about the chest are otherwise unremarkable. IMPRESSION: Stable chest x-ray. No evidence of acute cardiopulmonary abnormality. No evidence of pneumonia. Electronically Signed   By: Franki Cabot M.D.   On: 02/10/2015 10:43   Ct Chest Wo Contrast  01/16/2015  CLINICAL DATA:  Lung cancer diagnosed 2008. Chemotherapy and radiation therapy complete. EXAM: CT CHEST WITHOUT CONTRAST TECHNIQUE: Multidetector CT imaging of the chest was performed following the standard protocol without IV contrast. COMPARISON:  CT thorax 01/15/2014 FINDINGS: Mediastinum/Nodes: No axillary or supraclavicular lymphadenopathy. No mediastinal hilar lymphadenopathy. No pericardial fluid. Lungs/Pleura: There is volume loss in the LEFT hemi thorax. Surgical clips in the LEFT superior hilum. No new nodularity. There is peripheral pleural parenchymal thickening at the LEFT lung apex not changed from prior. No new pulmonary nodules. Upper abdomen: Limited view of the liver, kidneys, pancreas are unremarkable. Normal adrenal glands. Probable LEFT adrenal adenoma incompletely imaged. Musculoskeletal: No aggressive osseous lesion. Bulky continuous spurring of the thoracic spine. IMPRESSION: 1. Stable postsurgical and post therapy change in LEFT hemi thorax without evidence of local recurrence or metastasis. 2. Diffuse idiopathic skeletal hyperostosis of the thoracic spine. Electronically Signed   By: Suzy Bouchard M.D.   On: 01/16/2015 14:57    CBC  Recent Labs Lab 02/10/15 0900 02/11/15 0112  WBC 8.4 7.6  HGB 11.8* 10.9*  HCT 35.6* 32.5*  PLT 211 207  MCV 92.2 91.8  MCH 30.6 30.8  MCHC 33.1 33.5  RDW 14.4 14.3  LYMPHSABS 1.4  --   MONOABS 1.0  --   EOSABS 0.3  --   BASOSABS 0.0  --     Chemistries   Recent Labs Lab 02/10/15 0900  02/11/15 0112  NA 141 141  K 2.7* 3.4*  CL 103 109  CO2 31 26  GLUCOSE 101* 91  BUN 21* 21*  CREATININE 1.26* 1.03  CALCIUM 12.1* 11.6*  MG  --  1.6*  AST 24  --   ALT 29  --   ALKPHOS 85  --   BILITOT 0.5  --    ------------------------------------------------------------------------------------------------------------------  estimated creatinine clearance is 60.5 mL/min (by C-G formula based on Cr of 1.03). ------------------------------------------------------------------------------------------------------------------ No results for input(s): HGBA1C in the last 72 hours. ------------------------------------------------------------------------------------------------------------------ No results for input(s): CHOL, HDL, LDLCALC, TRIG, CHOLHDL, LDLDIRECT in the last 72 hours. ------------------------------------------------------------------------------------------------------------------  Recent Labs  02/10/15 1912  TSH 0.052*   ------------------------------------------------------------------------------------------------------------------ No results for input(s): VITAMINB12, FOLATE, FERRITIN, TIBC, IRON, RETICCTPCT in the last 72 hours.  Coagulation profile  Recent Labs Lab 02/10/15 1912  INR 1.14    No results for input(s): DDIMER in the last 72 hours.  Cardiac Enzymes  Recent Labs Lab 02/10/15 1339 02/10/15 1912 02/11/15 0112  TROPONINI 0.07* 0.07* 0.06*   ------------------------------------------------------------------------------------------------------------------ Invalid input(s): POCBNP  No results for input(s): GLUCAP in the last 72 hours.   RAI,RIPUDEEP M.D. [facility] 02/11/2015, 8:28 AM  Pager: [phone] Between 7am to 7pm - call Pager - [phone]  After 7pm go to [email or web address] - password [email or web address]  Call night coverage person covering after 7pm

## 2015-02-12 DIAGNOSIS — Z7189 Other specified counseling: Secondary | ICD-10-CM

## 2015-02-12 DIAGNOSIS — R1310 Dysphagia, unspecified: Secondary | ICD-10-CM | POA: Insufficient documentation

## 2015-02-12 DIAGNOSIS — Z515 Encounter for palliative care: Secondary | ICD-10-CM

## 2015-02-12 DIAGNOSIS — R627 Adult failure to thrive: Secondary | ICD-10-CM

## 2015-02-12 LAB — T3: T3, Total: 104 ng/dL (ref 71–180)

## 2015-02-12 LAB — CULTURE, RESPIRATORY

## 2015-02-12 LAB — BASIC METABOLIC PANEL
ANION GAP: 7 (ref 5–15)
BUN: 18 mg/dL (ref 6–20)
CALCIUM: 12.1 mg/dL — AB (ref 8.9–10.3)
CO2: 28 mmol/L (ref 22–32)
Chloride: 109 mmol/L (ref 101–111)
Creatinine, Ser: 1.09 mg/dL (ref 0.61–1.24)
Glucose, Bld: 107 mg/dL — ABNORMAL HIGH (ref 65–99)
Potassium: 3.3 mmol/L — ABNORMAL LOW (ref 3.5–5.1)
Sodium: 144 mmol/L (ref 135–145)

## 2015-02-12 LAB — CULTURE, RESPIRATORY W GRAM STAIN: Culture: NORMAL

## 2015-02-12 LAB — CBC
HEMATOCRIT: 30.8 % — AB (ref 39.0–52.0)
Hemoglobin: 10.3 g/dL — ABNORMAL LOW (ref 13.0–17.0)
MCH: 30.9 pg (ref 26.0–34.0)
MCHC: 33.4 g/dL (ref 30.0–36.0)
MCV: 92.5 fL (ref 78.0–100.0)
PLATELETS: 183 10*3/uL (ref 150–400)
RBC: 3.33 MIL/uL — ABNORMAL LOW (ref 4.22–5.81)
RDW: 14.2 % (ref 11.5–15.5)
WBC: 6.4 10*3/uL (ref 4.0–10.5)

## 2015-02-12 MED ORDER — DEXTROSE-NACL 5-0.45 % IV SOLN
INTRAVENOUS | Status: DC
  Administered 2015-02-12 – 2015-02-14 (×4): via INTRAVENOUS

## 2015-02-12 MED ORDER — POTASSIUM CHLORIDE CRYS ER 20 MEQ PO TBCR
40.0000 meq | EXTENDED_RELEASE_TABLET | Freq: Once | ORAL | Status: AC
  Administered 2015-02-12: 40 meq via ORAL
  Filled 2015-02-12: qty 2

## 2015-02-12 NOTE — Progress Notes (Signed)
PT Cancellation Note  Patient Details Name: [name] MRN: [id number] DOB: Jan 03, 1934   Cancelled Treatment:    Reason Eval/Treat Not Completed: Pt did not wish to participate with therapy on today. Pt/family to have palliative care meeting later today-will decide if they want PT services.    Weston Anna, MPT Pager: [phone]

## 2015-02-12 NOTE — Consult Note (Signed)
Consultation Note Date: 02/12/2015   Patient Name: [name]  DOB: 04-06-1933  MRN: [id number]  Age / Sex: 81 y.o., male  PCP: Delsa Grana Corrington, MD Referring Physician: Mendel Corning, MD  Reason for Consultation: Establishing goals of care  Clinical Assessment/Narrative:   Natale Thoma is a 81 y.o. male, coronary artery disease, PAD with left femoral stent, right-sided Ramsay Hunt syndrome with Bell's palsy with facial droop, right eye ptosis, requiring right eye suturing by ophthalmology as Dr. Valetta Close, concerns disease follows with [facility], generalized weakness, dysphagia, chronic systolic heart failure with last EF 35-40%, COPD, paroxysmal atrial fibrillation, essential hypertension, PVCs, history of lung cancer status post resection several years ago who lives at home with family, was recently diagnosed with right-sided Bell's palsy with right eye ptosis, right facial droop and dysphagia requiring rehabilitation admission for a month, discharged home about 2 months ago. He's been having problems with swallowing food or liquids, has developed a mild cough for the last week, comes to the ER with worsening generalized weakness, dry sounding cough.  In the ER workup suggestive of acute renal failure, dehydration, hypokalemia, acute bronchitis was his early community-acquired pneumonia, mildly elevated troponin.   Family reports continued physical and functional decline over the past 3 months.  His wife died less than 2 years ago and has been at a loss ever since.   Patient and family recognize his overall failure to thrive and understand the importance of advanced directive decisions and anticipatory care needs.  Open to continued discussion to further clarify [place] before discharge home   This NP Wadie Lessen reviewed medical records, received report from team, assessed the patient and then meet at the  patient's bedside along with his daughter Janace Hoard and her husband Jeral Fruit, son Louie Casa and  to discuss diagnosis,  prognosis, GOC, EOL wishes disposition and options.   A detailed discussion was had today regarding advanced directives.  Concepts specific to code status, artifical feeding and hydration, continued IV antibiotics and rehospitalization was had.  The difference between a aggressive medical intervention path  and a palliative comfort care path for this patient at this time was had.  Values and goals of care important to patient and family were attempted to be elicited.  Concept of Hospice and Palliative Care were discussed  Natural trajectory and expectations at EOL were discussed.  Questions and concerns addressed.  Hard Choices booklet left for review. Family encouraged to call with questions or concerns.  PMT will continue to support holistically.   Primary Decision Maker: Family as a whole  HCPOA: no    SUMMARY OF RECOMMENDATIONS  - continue to treat the treatable, hopeful for improvement - disposition will be home, we discussed home health vs hospice service at that time   Code Status/Advance Care Planning:  DNR-documented today     Code Status Orders        Start     Ordered   02/10/15 1542  Full code   Continuous     02/10/15 1541    Advance Directive Documentation        Most Recent Value   Type of Advance Directive  Healthcare Power of Attorney, Living will   Pre-existing out of facility DNR order (yellow form or pink MOST form)     "MOST" Form in Place?        Other Directives:None  Symptom Management:   Dysphagia: recommended diet with aspiration precautions Patient has verbalized no desire for artificial feeding now  or in the future  Palliative Prophylaxis:   Aspiration, Bowel Regimen, Delirium Protocol, Frequent Pain Assessment, Oral Care and Turn Reposition  Additional Recommendations (Limitations, Scope, Preferences):  Avoid Hospitalization and  No Artificial Feeding   Psycho-social/Spiritual:  Support System: Strong Desire for further Chaplaincy support:yes Additional Recommendations: Education on Hospice and Grief/Bereavement Support  Prognosis:  likely less than 6 months Discharge Planning: Home with Hospice   Chief Complaint/ Primary Diagnoses: Present on Admission:  . PVC (premature ventricular contraction) . CAD- CFX PCI '06, CTO RCA, distal CFX . Paroxysmal a-fib- NSR on Amiodarone . Cardiomyopathy, ischemic-EF 30-35% . Hypokalemia . Acute bronchitis  I have reviewed the medical record, interviewed the patient and family, and examined the patient. The following aspects are pertinent.  Past Medical History  Diagnosis Date  . CAD (coronary artery disease)     a. s/p DES-LCx 2006 b. repeat cath 2011- 90% distal LCx ISR, 100% mid RCA CTO, diffuse nonobstructive dz, medical management c. 07/2012 cath- 100% distal LCx ISR, 100% mid RCA CTO, nonobstructive dz; EF 40-45%  . PVC (premature ventricular contraction)   . HTN (hypertension)   . Paroxysmal a-fib ([place])   . PAD (peripheral artery disease) ([facility])     history of stenting in the past  . COPD (chronic obstructive pulmonary disease) ([place])   . LV dysfunction   . Anemia, iron deficiency   . Family history of anesthesia complication     "daughter gets PONV" (11/20/2013)  . Anginal pain ([place])   . Pneumonia 1-2 X's  . History of blood transfusion     "he's had several; probably related to his cancer" (11/20/2013)  . GERD (gastroesophageal reflux disease)   . H/O hiatal hernia   . History of gastric ulcer   . Arthritis     "generalized"  . Chronic lower back pain   . Anxiety   . Myocardial infarction ([place]) 09/2009; ~ 2014    [name] 10/24/2009;   . Lung cancer ([place]) dx'd 11/2005    non small cell   Social History   Social History  . Marital Status: Widowed    Spouse Name: N/A  . Number of Children: 2  . Years of Education: 12th   Occupational History  .  Retired     Nutritional therapist   Social History Main Topics  . Smoking status: Former Smoker -- 1.00 packs/day for 65 years    Types: Cigarettes    Quit date: 11/20/2014  . Smokeless tobacco: Never Used  . Alcohol Use: Yes     Comment: quit 40+ years ago  . Drug Use: No  . Sexual Activity: No   Other Topics Concern  . None   Social History Narrative   Patient lives at home alone.   Caffeine Use: several cups of coffee daily   Family History  Problem Relation Age of Onset  . Heart Problems Mother   . Breast cancer Mother   . Other Father     Motorcycle Accident  . Breast cancer Sister    Scheduled Meds: . amiodarone  100 mg Oral QHS  . amLODipine  5 mg Oral q morning - 10a  . ampicillin-sulbactam (UNASYN) IV  1.5 g Intravenous Q6H  . antiseptic oral rinse  7 mL Mouth Rinse BID  . aspirin  81 mg Oral Daily  . azithromycin  500 mg Intravenous Q24H  . carvedilol  3.125 mg Oral BID WC  . clopidogrel  75 mg Oral Daily  . erythromycin  1 application  Right Eye TID  . feeding supplement  1 Container Oral TID BM  . feeding supplement (PRO-STAT SUGAR FREE 64)  30 mL Oral BID  . heparin  5,000 Units Subcutaneous 3 times per day  . hydrALAZINE  25 mg Oral 3 times per day  . isosorbide mononitrate  60 mg Oral q morning - 10a  . loratadine  10 mg Oral Daily  . megestrol  625 mg Oral Daily  . pantoprazole  40 mg Oral Daily  . polyethylene glycol  17 g Oral Daily  . potassium chloride  40 mEq Oral Once  . sodium chloride  3 mL Intravenous Q12H   Continuous Infusions:  PRN Meds:.acetaminophen, albuterol, albuterol, alum & mag hydroxide-simeth, bisacodyl, diazepam, ferrous sulfate, guaiFENesin-dextromethorphan, hydrALAZINE, HYDROcodone-acetaminophen, nitroGLYCERIN, ondansetron **OR** ondansetron (ZOFRAN) IV Medications Prior to Admission:  Prior to Admission medications   Medication Sig Start Date End Date Taking? Authorizing Provider  acetaminophen (TYLENOL) 325 MG tablet Take 2 tablets  (650 mg total) by mouth every 4 (four) hours as needed for headache or mild pain. 01/05/14  Yes Luke K Kilroy, PA-C  albuterol (PROVENTIL) (2.5 MG/3ML) 0.083% nebulizer solution Take 2.5 mg by nebulization every 4 (four) hours as needed for wheezing or shortness of breath.  01/22/15  Yes Historical Provider, MD  amiodarone (PACERONE) 100 MG tablet Take 100 mg by mouth at bedtime.   Yes Historical Provider, MD  amLODipine (NORVASC) 10 MG tablet Take 5 mg by mouth every morning.  09/25/14  Yes Historical Provider, MD  aspirin 81 MG chewable tablet Chew 1 tablet (81 mg total) by mouth daily. 01/05/14  Yes Erlene Quan, PA-C  clopidogrel (PLAVIX) 75 MG tablet Take 1 tablet (75 mg total) by mouth daily. 01/22/15  Yes Luke K Kilroy, PA-C  diazepam (VALIUM) 2 MG tablet Take 1 tablet (2 mg total) by mouth every 8 (eight) hours as needed for muscle spasms. 11/30/14  Yes Shanker Kristeen Mans, MD  erythromycin ophthalmic ointment Place 1 application into the right eye 3 (three) times daily.   Yes Historical Provider, MD  feeding supplement (BOOST / RESOURCE BREEZE) LIQD Take 1 Container by mouth daily. 11/30/14  Yes Shanker Kristeen Mans, MD  ferrous sulfate 325 (65 FE) MG tablet Take 325 mg by mouth 3 (three) times daily as needed (for low iron, patient preference).  11/13/13  Yes Modena Jansky, MD  fexofenadine (ALLEGRA) 180 MG tablet Take 180 mg by mouth daily as needed (allergies).    Yes Historical Provider, MD  HYDROcodone-acetaminophen (NORCO/VICODIN) 5-325 MG tablet Take 1 tablet by mouth 2 (two) times daily as needed for moderate pain.   Yes Historical Provider, MD  isosorbide mononitrate (IMDUR) 60 MG 24 hr tablet Take 1 tablet (60 mg total) by mouth at bedtime. Patient taking differently: Take 60 mg by mouth every morning.  07/17/14  Yes Peter M Martinique, MD  lactose free nutrition (BOOST PLUS) LIQD Take 237 mLs by mouth 3 (three) times daily after meals. 11/30/14  Yes Shanker Kristeen Mans, MD  losartan (COZAAR) 100  MG tablet Take 100 mg by mouth at bedtime.    Yes Historical Provider, MD  megestrol (MEGACE ES) 625 MG/5ML suspension Take 5 mLs (625 mg total) by mouth daily. 01/22/15  Yes Curt Bears, MD  nitroGLYCERIN (NITROSTAT) 0.4 MG SL tablet Place 1 tablet (0.4 mg total) under the tongue every 5 (five) minutes as needed for chest pain (Chest pain). 11/29/13  Yes Burtis Junes, NP  pantoprazole ([place]) 40  MG tablet TAKE 1 TABLET ONCE A DAY 04/16/14  Yes Lorretta Harp, MD  polyethylene glycol [facility] / GLYCOLAX) packet Take 17 g by mouth daily.    Yes Historical Provider, MD  PROAIR HFA 108 (90 BASE) MCG/ACT inhaler Inhale 2 puffs into the lungs every 4 (four) hours as needed for wheezing or shortness of breath.  11/21/14  Yes Historical Provider, MD  amiodarone (PACERONE) 200 MG tablet TAKE 1 TABLET DAILY Patient not taking: Reported on 02/10/2015 01/07/15   Peter M Martinique, MD  carbidopa-levodopa (SINEMET IR) 25-100 MG tablet Take 1 tablet by mouth 3 (three) times daily. Reported on 02/10/2015    Historical Provider, MD  lidocaine (XYLOCAINE) 2 % solution Use as directed 15 mLs in the mouth or throat every 6 (six) hours as needed for mouth pain. Patient not taking: Reported on 02/10/2015 11/30/14   Jonetta Osgood, MD  nystatin (MYCOSTATIN) 100000 UNIT/ML suspension Take 5 mLs (500,000 Units total) by mouth 4 (four) times daily. Patient not taking: Reported on 02/10/2015 11/30/14   Jonetta Osgood, MD   Allergies  Allergen Reactions  . Ciprofloxacin Other (See Comments)    Joint pain  . Doxycycline Other (See Comments)    arthralgias  . Atorvastatin Other (See Comments)    Joint swelling  . Sulfa Drugs Cross Reactors Rash    Review of Systems  Unable to perform ROS   Physical Exam  Constitutional: He is oriented to person, place, and time. Vital signs are normal. He appears cachectic. He appears ill.  HENT:  Head: Normocephalic and atraumatic.  Mouth/Throat: Mucous membranes are  normal. No oropharyngeal exudate.  Cardiovascular: Normal rate, regular rhythm and normal heart sounds.   Respiratory: He has decreased breath sounds in the right lower field and the left lower field.  -audible throat secretions, weak cough, having difficult clearing  GI: Soft. Normal appearance and bowel sounds are normal.  Neurological: He is alert and oriented to person, place, and time. He displays atrophy.  Skin: Skin is warm and dry.    Vital Signs: BP 149/73 mmHg  Pulse 67  Temp(Src) 98.7 F (37.1 C) (Oral)  Resp 18  Ht '6\' 3"'$  (1.905 m)  Wt 73.5 kg (162 lb 0.6 oz)  BMI 20.25 kg/m2  SpO2 98%  SpO2: SpO2: 98 % O2 Device:SpO2: 98 % O2 Flow Rate: .O2 Flow Rate (L/min): 4 L/min  IO: Intake/output summary:  Intake/Output Summary (Last 24 hours) at 02/12/15 0853 Last data filed at 02/12/15 0600  Gross per 24 hour  Intake    450 ml  Output    850 ml  Net   -400 ml    LBM: Last BM Date: 02/11/15 Baseline Weight: Weight: 83.008 kg (183 lb) Most recent weight: Weight: 73.5 kg (162 lb 0.6 oz)      Palliative Assessment/Data:  Flowsheet Rows        Most Recent Value   Intake Tab    Referral Department  Hospitalist   Unit at Time of Referral  Cardiac/Telemetry Unit   Palliative Care Primary Diagnosis  Cancer   Date Notified  02/11/15   Palliative Care Type  New Palliative care   Reason for referral  Clarify Goals of Care   Date of Admission  02/10/15   # of days IP prior to Palliative referral  1   Clinical Assessment    Psychosocial & Spiritual Assessment    Palliative Care Outcomes       Additional Data Reviewed:  CBC:    Component Value Date/Time   WBC 6.4 02/12/2015 0450   WBC 6.4 01/16/2015 1255   HGB 10.3* 02/12/2015 0450   HGB 12.0* 01/16/2015 1255   HCT 30.8* 02/12/2015 0450   HCT 36.2* 01/16/2015 1255   PLT 183 02/12/2015 0450   PLT 236 01/16/2015 1255   MCV 92.5 02/12/2015 0450   MCV 93.5 01/16/2015 1255   NEUTROABS 5.7 02/10/2015 0900    NEUTROABS 3.7 01/16/2015 1255   LYMPHSABS 1.4 02/10/2015 0900   LYMPHSABS 1.6 01/16/2015 1255   MONOABS 1.0 02/10/2015 0900   MONOABS 0.7 01/16/2015 1255   EOSABS 0.3 02/10/2015 0900   EOSABS 0.4 01/16/2015 1255   BASOSABS 0.0 02/10/2015 0900   BASOSABS 0.0 01/16/2015 1255   Comprehensive Metabolic Panel:    Component Value Date/Time   NA 144 02/12/2015 0450   NA 138 01/16/2015 1255   K 3.3* 02/12/2015 0450   K 3.5 01/16/2015 1255   CL 109 02/12/2015 0450   CO2 28 02/12/2015 0450   CO2 27 01/16/2015 1255   BUN 18 02/12/2015 0450   BUN 11.7 01/16/2015 1255   CREATININE 1.09 02/12/2015 0450   CREATININE 0.9 01/16/2015 1255   CREATININE 0.75 12/28/2013 1152   GLUCOSE 107* 02/12/2015 0450   GLUCOSE 108 01/16/2015 1255   CALCIUM 12.1* 02/12/2015 0450   CALCIUM 12.2* 01/16/2015 1255   AST 24 02/10/2015 0900   AST 63* 01/16/2015 1255   ALT 29 02/10/2015 0900   ALT 43 01/16/2015 1255   ALKPHOS 85 02/10/2015 0900   ALKPHOS 88 01/16/2015 1255   BILITOT 0.5 02/10/2015 0900   BILITOT 0.66 01/16/2015 1255   PROT 6.5 02/10/2015 0900   PROT 6.6 01/16/2015 1255   ALBUMIN 3.3* 02/10/2015 0900   ALBUMIN 3.4* 01/16/2015 1255   Discussed with Dr Tana Coast  Time In: 1130 Time Out: 1300 Time Total: 90 min Greater than 50%  of this time was spent counseling and coordinating care related to the above assessment and plan.  Signed by: Wadie Lessen, NP  Knox Royalty, NP  02/12/2015, 8:53 AM  Please contact Palliative Medicine Team phone at [phone] for questions and concerns.

## 2015-02-12 NOTE — Progress Notes (Signed)
[facility]                                                                              Patient Demographics  [name], is a 81 y.o. male, DOB - November 22, 1933, UMP:[id number]  Admit date - 02/10/2015   Admitting Physician Thurnell Lose, MD  Outpatient Primary MD for the patient is Curly Rim, MD  LOS - 2   Chief Complaint  Patient presents with  . Weakness       Brief HPI   [name] is a 81 y.o. Male with CAD, PAD with left femoral stent in the past follows with Dr. 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 Valetta Close, generalized weakness, dysphagia, chronic systolic heart failure with last EF 35-40%, COPD, paroxysmal atrial fibrillation, essential hypertension, lung cancer status post resection several years ago who lives at home with family, was recently diagnosed with right-sided Bell's palsy with right eye ptosis, right facial droop and dysphagia requiring rehabilitation admission for a month, discharged home about 2 months ago. He's been having problems with swallowing food or liquids, developed a mild cough for the last week, presented to ED with worsening generalized weakness, dry sounding cough. In the ER workup suggestive of acute renal failure, dehydration, hypokalemia, acute bronchitis or early community-acquired pneumonia, mildly elevated troponin. Cardiology was consulted, Dr Marlou Porch.  Assessment & Plan    Principal Problem:   Acute bronchitis with early pneumonia, aspiration, acute hypoxic respiratory failure - continue IV Unasyn, Zithromax - Follow sputum cultures, blood cultures negative so far, continue O2 - swallow evaluation done 12/19, severe dysphagia with aspiration, grossly weak with poor cough mechanism.  MBS not indicated as it will not change patient's outcome and there is not a consistent see that patient will not aspirate.  Speech therapy recommending liquids at this time.  - Requested palliative consult for goals of care, meeting today.  - Patient's daughter and son-in-law at the bedside, has been feeding clear liquid diet without any issues, requested to upgrade the diet. I have increased the diet to full liquids, if patient has no issues, repeat the swallow testing again on 12/21.  Active Problems:   CAD- CFX PCI '06, CTO RCA, distal CFX with mildly elevated troponins - Currently having no chest pain, has dyspnea, could be due to pneumonia,  - 2-D echo 12/19 showed EF of 35-40% with moderately reduced systolic function. Reviewed prior office notes, Dr. Martinique (06/2014), noted that EF was 30-35%  - Continue aspirin, Plavix, Coreg, Imdur  Generalized deconditioning/failure to thrive - PTOT evaluation pending    Ramsay Hunt syndrome with right-sided facial droop and ptosis. - Follows with ophthalmologist Dr. Luvenia Heller, right eyelid has been sutured - continue supportive care, outpatient follow-up with his ophthalmologist after discharge. Per daughter at the bedside, patient has eye surgery scheduled on 12/28, however she will confirm it with ophthalmologist today. If it is not postponed, Plavix will need to be held from 12/21.  Ischemic cardiomyopathy with chronic systolic heart failure EF 35-40% - 2-D echocardiogram showed EF of 35-40% - continue  Imdur, low-dose Coreg, hold ARB due to AKI    Paroxysmal a-fib- NSR on Amiodarone - Continue amiodarone, not on anticoagulation per cardiology due to high fall risk, poor balance and deconditioning.  Hypokalemia Replaced  Low TSH - T4 2.9, TSH 0.05, T3 normal, follow with PCP.   Code Status: full code  Family Communication: Discussed in detail with the patient, all imaging results, lab results explained to the patient, daughter, son in law at the bedside   Disposition Plan:   Time Spent in minutes   25 minutes  Procedures  Chest x-ray echo  Consults     Cardiology  DVT Prophylaxis  heparin   Medications  Scheduled Meds: . amiodarone  100 mg Oral QHS  . amLODipine  5 mg Oral q morning - 10a  . ampicillin-sulbactam (UNASYN) IV  1.5 g Intravenous Q6H  . antiseptic oral rinse  7 mL Mouth Rinse BID  . aspirin  81 mg Oral Daily  . azithromycin  500 mg Intravenous Q24H  . carvedilol  3.125 mg Oral BID WC  . clopidogrel  75 mg Oral Daily  . erythromycin  1 application Right Eye TID  . feeding supplement  1 Container Oral TID BM  . feeding supplement (PRO-STAT SUGAR FREE 64)  30 mL Oral BID  . heparin  5,000 Units Subcutaneous 3 times per day  . hydrALAZINE  25 mg Oral 3 times per day  . isosorbide mononitrate  60 mg Oral q morning - 10a  . loratadine  10 mg Oral Daily  . megestrol  625 mg Oral Daily  . pantoprazole  40 mg Oral Daily  . polyethylene glycol  17 g Oral Daily  . sodium chloride  3 mL Intravenous Q12H   Continuous Infusions: . dextrose 5 % and 0.45% NaCl 75 mL/hr at 02/12/15 1203   PRN Meds:.acetaminophen, albuterol, albuterol, alum & mag hydroxide-simeth, bisacodyl, diazepam, ferrous sulfate, guaiFENesin-dextromethorphan, hydrALAZINE, HYDROcodone-acetaminophen, nitroGLYCERIN, ondansetron **OR** ondansetron (ZOFRAN) IV   Antibiotics   Anti-infectives    Start     Dose/Rate Route Frequency Ordered Stop   02/10/15 2200  ampicillin-sulbactam (UNASYN) 1.5 g in sodium chloride 0.9 % 50 mL IVPB     1.5 g 100 mL/hr over 30 Minutes Intravenous Every 6 hours 02/10/15 1401     02/10/15 1430  ampicillin-sulbactam (UNASYN) 1.5 g in sodium chloride 0.9 % 50 mL IVPB     1.5 g 100 mL/hr over 30 Minutes Intravenous  Once 02/10/15 1401 02/10/15 1813   02/10/15 1400  azithromycin (ZITHROMAX) 500 mg in dextrose 5 % 250 mL IVPB     500 mg 250 mL/hr over 60 Minutes Intravenous Every 24 hours 02/10/15 1350          Subjective:   [name] was seen and examined today. Very deconditioned, weak. Daughter and son-in law feeding  patient without any difficulty. No nausea, vomiting, abdominal pain, any diarrhea. No fevers or chills.  Objective:   Blood pressure 149/73, pulse 67, temperature 98.7 F (37.1 C), temperature source Oral, resp. rate 18, height '6\' 3"'$  (1.905 m), weight 73.5 kg (162 lb 0.6 oz), SpO2 98 %.  Wt Readings from Last 3 Encounters:  02/11/15 73.5 kg (162 lb 0.6 oz)  12/20/14 85.73 kg (189 lb)  12/19/14 85.821 kg (189 lb 3.2 oz)     Intake/Output Summary (Last 24 hours) at 02/12/15 1237 Last data filed at 02/12/15 0600  Gross per 24 hour  Intake    400 ml  Output  850 ml  Net   -450 ml    Exam  General: Alert and oriented, NAD  HEENT:  right eye sutured closed, mucous membranes moist.   Neck: Supple, no JVD, no masses  CVS: S1 S2 clear, RRR  Respiratory: Decreased breath sounds throughout, poor effort  Abdomen: Soft, nontender, nondistended, + bowel sounds  Ext: no cyanosis clubbing or edema  Neuro: right facial nerve palsy, right eye ptosis, Strength 5/5 upper and lower extremities bilaterally  Skin: No rashes  Psych: Normal affect and demeanor, alert and oriented    Data Review   Micro Results Recent Results (from the past 240 hour(s))  Culture, blood (routine x 2)     Status: None (Preliminary result)   Collection Time: 02/10/15  2:35 PM  Result Value Ref Range Status   Specimen Description BLOOD LEFT ARM  Final   Special Requests BOTTLES DRAWN AEROBIC AND ANAEROBIC 5CC  Final   Culture   Final    NO GROWTH < 24 HOURS Performed at [facility]    Report Status PENDING  Incomplete  Culture, blood (routine x 2)     Status: None (Preliminary result)   Collection Time: 02/10/15  2:42 PM  Result Value Ref Range Status   Specimen Description BLOOD LEFT HAND  Final   Special Requests BOTTLES DRAWN AEROBIC AND ANAEROBIC 5CC  Final   Culture   Final    NO GROWTH < 24 HOURS Performed at [facility]    Report Status PENDING  Incomplete  Culture,  respiratory (NON-Expectorated)     Status: None   Collection Time: 02/10/15  4:30 PM  Result Value Ref Range Status   Specimen Description SPUTUM  Final   Special Requests NONE  Final   Gram Stain   Final    MODERATE WBC FEW SQUAMOUS EPITHELIAL CELLS PRESENT MODERATE GRAM POSITIVE COCCI IN CLUSTERS FEW YEAST Performed at [facility]    Culture   Final    NORMAL OROPHARYNGEAL FLORA Performed at [facility]    Report Status 02/12/2015 FINAL  Final  Culture, expectorated sputum-assessment     Status: None   Collection Time: 02/10/15  4:35 PM  Result Value Ref Range Status   Specimen Description SPUTUM  Final   Special Requests NONE  Final   Sputum evaluation   Final    THIS SPECIMEN IS ACCEPTABLE. RESPIRATORY CULTURE REPORT TO FOLLOW.   Report Status 02/10/2015 FINAL  Final    Radiology Reports Dg Chest 2 View  02/10/2015  CLINICAL DATA:  Increasing weakness, low back pain and lower abdominal pain, productive cough with green sputum. History of lung cancer in 2008 status post chemotherapy and radiation therapy. EXAM: CHEST  2 VIEW COMPARISON:  Chest CT dated 01/16/2015 and chest x-ray dated 11/22/2014. FINDINGS: Heart size is upper normal, unchanged. Overall cardiomediastinal silhouette is stable in size and configuration. Surgical clips again noted in the left hilum related to patient's previous partial left lung resection. The chronic pleural-parenchymal thickening/fibrosis at the left lung apex is unchanged. Additional mild scarring/fibrosis at the left lung base is unchanged. No new lung findings seen. Again noted is the diffuse DISH throughout the slightly kyphotic thoracic spine. Osseous structures about the chest are otherwise unremarkable. IMPRESSION: Stable chest x-ray. No evidence of acute cardiopulmonary abnormality. No evidence of pneumonia. Electronically Signed   By: Franki Cabot M.D.   On: 02/10/2015 10:43   Ct Chest Wo Contrast  01/16/2015  CLINICAL  DATA:  Lung  cancer diagnosed 2008. Chemotherapy and radiation therapy complete. EXAM: CT CHEST WITHOUT CONTRAST TECHNIQUE: Multidetector CT imaging of the chest was performed following the standard protocol without IV contrast. COMPARISON:  CT thorax 01/15/2014 FINDINGS: Mediastinum/Nodes: No axillary or supraclavicular lymphadenopathy. No mediastinal hilar lymphadenopathy. No pericardial fluid. Lungs/Pleura: There is volume loss in the LEFT hemi thorax. Surgical clips in the LEFT superior hilum. No new nodularity. There is peripheral pleural parenchymal thickening at the LEFT lung apex not changed from prior. No new pulmonary nodules. Upper abdomen: Limited view of the liver, kidneys, pancreas are unremarkable. Normal adrenal glands. Probable LEFT adrenal adenoma incompletely imaged. Musculoskeletal: No aggressive osseous lesion. Bulky continuous spurring of the thoracic spine. IMPRESSION: 1. Stable postsurgical and post therapy change in LEFT hemi thorax without evidence of local recurrence or metastasis. 2. Diffuse idiopathic skeletal hyperostosis of the thoracic spine. Electronically Signed   By: Suzy Bouchard M.D.   On: 01/16/2015 14:57    CBC  Recent Labs Lab 02/10/15 0900 02/11/15 0112 02/12/15 0450  WBC 8.4 7.6 6.4  HGB 11.8* 10.9* 10.3*  HCT 35.6* 32.5* 30.8*  PLT 211 207 183  MCV 92.2 91.8 92.5  MCH 30.6 30.8 30.9  MCHC 33.1 33.5 33.4  RDW 14.4 14.3 14.2  LYMPHSABS 1.4  --   --   MONOABS 1.0  --   --   EOSABS 0.3  --   --   BASOSABS 0.0  --   --     Chemistries   Recent Labs Lab 02/10/15 0900 02/11/15 0112 02/12/15 0450  NA 141 141 144  K 2.7* 3.4* 3.3*  CL 103 109 109  CO2 '31 26 28  '$ GLUCOSE 101* 91 107*  BUN 21* 21* 18  CREATININE 1.26* 1.03 1.09  CALCIUM 12.1* 11.6* 12.1*  MG  --  1.6*  --   AST 24  --   --   ALT 29  --   --   ALKPHOS 85  --   --   BILITOT 0.5  --   --     ------------------------------------------------------------------------------------------------------------------ estimated creatinine clearance is 57.1 mL/min (by C-G formula based on Cr of 1.09). ------------------------------------------------------------------------------------------------------------------ No results for input(s): HGBA1C in the last 72 hours. ------------------------------------------------------------------------------------------------------------------ No results for input(s): CHOL, HDL, LDLCALC, TRIG, CHOLHDL, LDLDIRECT in the last 72 hours. ------------------------------------------------------------------------------------------------------------------  Recent Labs  02/10/15 1912  TSH 0.052*   ------------------------------------------------------------------------------------------------------------------ No results for input(s): VITAMINB12, FOLATE, FERRITIN, TIBC, IRON, RETICCTPCT in the last 72 hours.  Coagulation profile  Recent Labs Lab 02/10/15 1912  INR 1.14    No results for input(s): DDIMER in the last 72 hours.  Cardiac Enzymes  Recent Labs Lab 02/10/15 1339 02/10/15 1912 02/11/15 0112  TROPONINI 0.07* 0.07* 0.06*   ------------------------------------------------------------------------------------------------------------------ Invalid input(s): POCBNP  No results for input(s): GLUCAP in the last 72 hours.   [name] M.D. [facility] 02/12/2015, 12:37 PM  Pager: [phone] Between 7am to 7pm - call Pager - [phone]  After 7pm go to [email or web address] - password [email or web address]  Call night coverage person covering after 7pm

## 2015-02-13 ENCOUNTER — Inpatient Hospital Stay (HOSPITAL_COMMUNITY): Payer: Medicare Other

## 2015-02-13 DIAGNOSIS — T17908A Unspecified foreign body in respiratory tract, part unspecified causing other injury, initial encounter: Secondary | ICD-10-CM | POA: Insufficient documentation

## 2015-02-13 DIAGNOSIS — T17998A Other foreign object in respiratory tract, part unspecified causing other injury, initial encounter: Secondary | ICD-10-CM

## 2015-02-13 DIAGNOSIS — L8990 Pressure ulcer of unspecified site, unspecified stage: Secondary | ICD-10-CM | POA: Insufficient documentation

## 2015-02-13 MED ORDER — CARBIDOPA-LEVODOPA 25-100 MG PO TABS
1.0000 | ORAL_TABLET | Freq: Three times a day (TID) | ORAL | Status: DC
  Administered 2015-02-13 – 2015-02-14 (×5): 1 via ORAL
  Filled 2015-02-13 (×5): qty 1

## 2015-02-13 MED ORDER — ENSURE ENLIVE PO LIQD
237.0000 mL | Freq: Two times a day (BID) | ORAL | Status: DC
  Administered 2015-02-13 – 2015-02-14 (×3): 237 mL via ORAL

## 2015-02-13 MED ORDER — FLUTICASONE PROPIONATE 50 MCG/ACT NA SUSP
1.0000 | Freq: Every day | NASAL | Status: DC
  Administered 2015-02-13 – 2015-02-14 (×2): 1 via NASAL
  Filled 2015-02-13: qty 16

## 2015-02-13 NOTE — Progress Notes (Signed)
[name] CBJ:[id number] DOB: Jan 31, 1934 DOA: 02/10/2015 PCP: Curly Rim, MD  Brief narrative: 29 ? Recent admission 9/30-->11/30/14 for Herpes and Ramsey-Hunt disease since 10/2014-found on that amdission as well to have dysphagia P Afib on AMiodarone , CHad2Vasc2 score~3 but poor AC candidate CAD c DEDES to LCX 2006, Cath 2011 COPD H/o Lung Ca HTn GIB 10/2013 intermittetn claudication s.p R commin iliac stenting Chr Dizzyness Fe def anemia Tob abuse  Admitted with aki, hypokalemia and ? CAP later felt to be more aspiration PNa related   Antibiotics:  unasyn   Subjective  Sleepy roussable Hard to communicate with daughte rin room   Objective       Objective: Filed Vitals:   02/12/15 0455 02/12/15 1334 02/12/15 2129 02/13/15 0627  BP: 149/73 108/41 134/57 147/64  Pulse: 67 58 59 50  Temp: 98.7 F (37.1 C) 98.2 F (36.8 C) 98.5 F (36.9 C) 98.6 F (37 C)  TempSrc: Oral Axillary Axillary Oral  Resp: '18 18 18 18  '$ Height:      Weight:      SpO2: 98% 96% 99% 100%    Intake/Output Summary (Last 24 hours) at 02/13/15 1226 Last data filed at 02/13/15 0600  Gross per 24 hour  Intake 1746.25 ml  Output   1250 ml  Net 496.25 ml    Exam:  General: sleepy, rouasable Cardiovascular: s1 s 2no m/r/g Respiratory:  cta b no issues  Abdomen: soft nt nd  Skin no le edema Neuro sleepy  Data Reviewed: Basic Metabolic Panel:  Recent Labs Lab 02/10/15 0900 02/11/15 0112 02/12/15 0450  NA 141 141 144  K 2.7* 3.4* 3.3*  CL 103 109 109  CO2 '31 26 28  '$ GLUCOSE 101* 91 107*  BUN 21* 21* 18  CREATININE 1.26* 1.03 1.09  CALCIUM 12.1* 11.6* 12.1*  MG  --  1.6*  --    Liver Function Tests:  Recent Labs Lab 02/10/15 0900  AST 24  ALT 29  ALKPHOS 85  BILITOT 0.5  PROT 6.5  ALBUMIN 3.3*   No results for input(s): LIPASE, AMYLASE in the last 168 hours. No results for input(s): AMMONIA in the last 168 hours. CBC:  Recent Labs Lab  02/10/15 0900 02/11/15 0112 02/12/15 0450  WBC 8.4 7.6 6.4  NEUTROABS 5.7  --   --   HGB 11.8* 10.9* 10.3*  HCT 35.6* 32.5* 30.8*  MCV 92.2 91.8 92.5  PLT 211 207 183   Cardiac Enzymes:  Recent Labs Lab 02/10/15 1339 02/10/15 1912 02/11/15 0112  TROPONINI 0.07* 0.07* 0.06*   BNP: Invalid input(s): POCBNP CBG: No results for input(s): GLUCAP in the last 168 hours.  Recent Results (from the past 240 hour(s))  Culture, blood (routine x 2)     Status: None (Preliminary result)   Collection Time: 02/10/15  2:35 PM  Result Value Ref Range Status   Specimen Description BLOOD LEFT ARM  Final   Special Requests BOTTLES DRAWN AEROBIC AND ANAEROBIC 5CC  Final   Culture   Final    NO GROWTH 3 DAYS Performed at [facility]    Report Status PENDING  Incomplete  Culture, blood (routine x 2)     Status: None (Preliminary result)   Collection Time: 02/10/15  2:42 PM  Result Value Ref Range Status   Specimen Description BLOOD LEFT HAND  Final   Special Requests BOTTLES DRAWN AEROBIC AND ANAEROBIC 5CC  Final   Culture   Final    NO  GROWTH 3 DAYS Performed at [facility]    Report Status PENDING  Incomplete  Culture, respiratory (NON-Expectorated)     Status: None   Collection Time: 02/10/15  4:30 PM  Result Value Ref Range Status   Specimen Description SPUTUM  Final   Special Requests NONE  Final   Gram Stain   Final    MODERATE WBC FEW SQUAMOUS EPITHELIAL CELLS PRESENT MODERATE GRAM POSITIVE COCCI IN CLUSTERS FEW YEAST Performed at [facility]    Culture   Final    NORMAL OROPHARYNGEAL FLORA Performed at [facility]    Report Status 02/12/2015 FINAL  Final  Culture, expectorated sputum-assessment     Status: None   Collection Time: 02/10/15  4:35 PM  Result Value Ref Range Status   Specimen Description SPUTUM  Final   Special Requests NONE  Final   Sputum evaluation   Final    THIS SPECIMEN IS ACCEPTABLE. RESPIRATORY CULTURE REPORT  TO FOLLOW.   Report Status 02/10/2015 FINAL  Final     Studies:              All Imaging reviewed and is as per above notation   Scheduled Meds: . amiodarone  100 mg Oral QHS  . amLODipine  5 mg Oral q morning - 10a  . ampicillin-sulbactam (UNASYN) IV  1.5 g Intravenous Q6H  . antiseptic oral rinse  7 mL Mouth Rinse BID  . aspirin  81 mg Oral Daily  . azithromycin  500 mg Intravenous Q24H  . carvedilol  3.125 mg Oral BID WC  . clopidogrel  75 mg Oral Daily  . erythromycin  1 application Right Eye TID  . feeding supplement  1 Container Oral TID BM  . feeding supplement (ENSURE ENLIVE)  237 mL Oral BID BM  . fluticasone  1 spray Each Nare Daily  . heparin  5,000 Units Subcutaneous 3 times per day  . hydrALAZINE  25 mg Oral 3 times per day  . isosorbide mononitrate  60 mg Oral q morning - 10a  . loratadine  10 mg Oral Daily  . pantoprazole  40 mg Oral Daily  . polyethylene glycol  17 g Oral Daily  . sodium chloride  3 mL Intravenous Q12H   Continuous Infusions: . dextrose 5 % and 0.45% NaCl 75 mL/hr at 02/12/15 1203     Assessment/Plan:  Acute bronchitis with early pneumonia, aspiration, acute hypoxic respiratory failure - continue IV Unasyn, Zithromax - Follow sputum cultures, blood cultures negative so far, continue O2 - swallow evaluation done 12/19, severe dysphagia with aspiration, grossly weak with poor cough mechanism. MBS not indicated as it will not change patient's outcome and there is not a consistent see that patient will not aspirate. Speech therapy recommending liquids at this time.  - Requested palliative consult for goals of care, meeting today.  - Patient's daughter and son-in-law at the bedside, has been feeding clear liquid diet but had more aspiration on full liquid -backed down to CLD on 12/21 -Pallaitive to see and comm net  Parkinsonism -was taken off of Carbi-dopa -will re-initiate at low doses and see if any differences with mentation -f not,  this is probably all in keeping with aspiration and mentation would not likely change long term to    CAD- CFX PCI '06, CTO RCA, distal CFX with mildly elevated troponins - Currently having no chest pain, has dyspnea, could be due to pneumonia,  - 2-D echo 12/19 showed EF of  35-40% with moderately reduced systolic function. Reviewed prior office notes, Dr. 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 - Follows with ophthalmologist Dr. Luvenia Heller, right eyelid has been sutured - continue supportive care, outpatient follow-up with his ophthalmologist after discharge. Per daughter at the bedside, patient has eye surgery scheduled on 12/28 it is postponed  Ischemic cardiomyopathy with chronic systolic heart failure EF 35-40% - 2-D echocardiogram showed EF of 35-40% - continue Imdur, low-dose Coreg, hold ARB due to AKI   Paroxysmal a-fib- NSR on Amiodarone - Continue amiodarone, not on anticoagulation per cardiology due to high fall risk, poor balance and deconditioning.  Hypokalemia Replaced  Low TSH - T4 2.9, TSH 0.05, T3 normal, follow with PCP.   Family updated bedside long discussion lebning towards pallaitive care and comfort in 24-48 hr if no improvement   Verneita Griffes, MD  [facility] Pager [phone] 02/13/2015, 12:26 PM    LOS: 3 days

## 2015-02-13 NOTE — Progress Notes (Signed)
Speech Language Pathology Treatment: Dysphagia  Patient Details Name: JARIUS DIEUDONNE MRN: [id number] DOB: [birth year] Today's Date: 02/13/2015 Time: 7793-9688 SLP Time Calculation (min) (ACUTE ONLY): 23 min  Assessment / Plan / Recommendation Clinical Impression  Pt sleeping since bath this am. Daughter reports pt declined dinner last night and breakfast this morning. Adequately awakened pt for trials ensure mixed with milk, italian ice and ice cream. Multiple swallows, suspect delayed swallow initiation and delayed cough. He is very deconditioned with increased aspiration risk. Not safe to attempt upgraded textures. Educated dtr verbally and visually for identification of when swallow is triggered and clinical reasoning for slow rate to prevent dyspnea. Continue full liquids and strict precautions. Follow up x 1.   HPI HPI: 81 yo male adm to [facility] with cough, AMS, weakness - Pt diagnosed with bronchitis.  PMH + for tremors tx with carb/levo per notes, lung cancer s/p surgery and chemoradiation, Ramsay Hunt, Bell's palsy, facial droop, dysphagia, rehab stay.  Swallow evaluation ordered. CXR negative for acute pna.       SLP Plan  Continue with current plan of care     Recommendations  Diet recommendations: Thin liquid (full liquids) Liquids provided via: Straw Medication Administration: Crushed with puree Supervision: Full supervision/cueing for compensatory strategies;Staff to assist with self feeding Compensations:  (ensure swallow triggered prior to next bite/sip) Postural Changes and/or Swallow Maneuvers: Seated upright 90 degrees              Oral Care Recommendations: Oral care BID Follow up Recommendations: None Plan: Continue with current plan of care   Houston Siren 02/13/2015, 11:24 AM   Orbie Pyo Colvin Caroli.Ed [employer] [phone]

## 2015-02-13 NOTE — Progress Notes (Signed)
Nutrition Follow-up  DOCUMENTATION CODES:   Not applicable  INTERVENTION:  - Continue Boost Breeze TID - Will d/c Prostat - Will order Ensure Enlive BID, each supplement provides 350 kcal and 20 grams of protein - RD will continue to monitor for needs  NUTRITION DIAGNOSIS:   Inadequate oral intake related to poor appetite as evidenced by per patient/family report. -ongoing  GOAL:   Patient will meet greater than or equal to 90% of their needs -unmet  MONITOR:   PO intake, Supplement acceptance, Weight trends, Labs, I & O's  ASSESSMENT:   81 y.o. male, coronary artery disease all is with Dr. Renato Battles he, PAD with left femoral stent in the past follows with Dr. 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 Valetta Close, concerns disease follows with [facility] neurology, generalized weakness, dysphagia, chronic systolic heart failure with last EF 35-40%, COPD, paroxysmal atrial fibrillation, essential hypertension, PVCs, history of lung cancer status post resection several years ago who lives at home with family, was recently diagnosed with right-sided Bell's palsy with right eye ptosis, right facial droop and dysphagia requiring rehabilitation admission for a month, discharged home about 2 months ago. He's been having problems with swallowing food or liquids, has developed a mild cough for the last week, comes to the ER with worsening generalized weakness, dry sounding cough.  12/21 No intakes documented since last assessment. Spoke with family member who is at bedside as pt was sleeping. Did not perform physical assessment at this time to respect pt comfort. Family member states that pt has not been doing well with intakes today. She states Ensure and Boost Breeze are being provided but that pt has been unable to drink through a straw today. MD note from yesterday states pt to be on FLD; will  change nutrition supplements as outlined above now that pt advanced from [facility].  Pt not meeting needs. Palliative care note states that family/pt do not wish for artificial feeding. Family member denies questions or needs at this time. Medications reviewed. Labs reviewed; K: 3.3 mmol/L, Ca: 12.1 mg/dL.    12/19 - Per chart review, pt ate 25% of breakfast this AM which family member reports was 2 bites of grits and 2 bites of eggs.  - Pt sleeping at time of RD visit and all information was provided by daughter-in-law who is at bedside.  - She states that pt's appetite has been poor for "awhile." She is unable to further specify on time frame for poor intakes.  - She states that pt will sometimes refuse intakes altogether and other times he is willing to take a few bites.  - She states that recently pt had been complaining of abdominal pain with intakes which she and other family members felt may be related to constipation but that constipation has since resolved. - She states that PTA pt was having difficulty with chewing and swallowing and family felt pt may be aspirating; daughter-in-law states that SLP saw pt earlier in the day and confirmed that pt is aspirating. Please see SLP note from today at 1108 for further information. - PTA, pt was drinking 1 or more bottles of Ensure which was mixed: half Ensure and half milk.  - Daughter-in-law is unsure of weight PTA but feels pt has been losing weight with poor intakes.  - No weight hx available in chart prior to this admission. - Physical assessment not done at this time  for pt comfort as he was sleeping.  - Unable to state malnutrition currently but is likely present; will complete physical assessment at follow-up and document accordingly.   Diet Order:  Diet full liquid Room service appropriate?: Yes; Fluid consistency:: Thin  Skin:  Reviewed, no issues  Last BM:  12/19  Height:   Ht Readings from Last 1 Encounters:  02/10/15 '6\' 3"'$   (1.905 m)    Weight:   Wt Readings from Last 1 Encounters:  02/11/15 162 lb 0.6 oz (73.5 kg)    Ideal Body Weight:  89.09 kg (kg)  BMI:  Body mass index is 20.25 kg/(m^2).  Estimated Nutritional Needs:   Kcal:  1615-1830 (22-25 kcal/kg)  Protein:  65-75 grams  Fluid:  2-2.3 L/day  EDUCATION NEEDS:   No education needs identified at this time     Jarome Matin, RD, LDN Inpatient Clinical Dietitian Pager # [phone] After hours/weekend pager # [phone]

## 2015-02-13 NOTE — Progress Notes (Signed)
PT Cancellation Note / Screen  Patient Details Name: [name] MRN: [id number] DOB: 03-Apr-1933   Cancelled Treatment:    Reason Eval/Treat Not Completed: PT screened, no needs identified, will sign off  Palliative meeting yesterday.  Family declines PT services at this time.  Please reorder if new needs arise.  Thanks.   [name],[name] 02/13/2015, 12:16 PM [name], PT, DPT 02/13/2015 Pager: [phone]

## 2015-02-13 NOTE — Progress Notes (Signed)
Pharmacy Antibiotic Follow-up Note  [name] is a 81 y.o. year-old male admitted on 02/10/2015.  The patient is currently on day 4 of Unasyn and Aztreonam for bronchitis, suspected aspiration.  Assessment/Plan:  Continue Unasyn 1.5g IV Q6H  Continue Azithromycin '500mg'$  IV q24h per MD  Follow up renal fxn, culture results, and clinical course.    Temp (24hrs), Avg:98.4 F (36.9 C), Min:98.2 F (36.8 C), Max:98.6 F (37 C)   Recent Labs Lab 02/10/15 0900 02/11/15 0112 02/12/15 0450  WBC 8.4 7.6 6.4    Recent Labs Lab 02/10/15 0900 02/11/15 0112 02/12/15 0450  CREATININE 1.26* 1.03 1.09   Estimated Creatinine Clearance: 57.1 mL/min (by C-G formula based on Cr of 1.09).    Allergies  Allergen Reactions  . Ciprofloxacin Other (See Comments)    Joint pain  . Doxycycline Other (See Comments)    arthralgias  . Atorvastatin Other (See Comments)    Joint swelling  . Sulfa Drugs Cross Reactors Rash    Antimicrobials this admission: 12/18 >> Azithromycin >>  12/18 >> Unasyn >>   Levels/dose changes this admission: None  Microbiology results: 12/18 Sputum: normal flora 12/18 Blood x2: NGTD  Thank you for allowing pharmacy to be a part of this patient's care. Gretta Arab PharmD, BCPS Pager [phone] 02/13/2015 9:15 AM

## 2015-02-13 NOTE — Care Management Important Message (Signed)
Important Message  Patient Details  Name: [name] MRN: [id number] Date of Birth: March 16, 1933   Medicare Important Message Given:  Yes    [name] 02/13/2015, [place] Message  Patient Details  Name: [name] MRN: [id number] Date of Birth: 24-Dec-1933   Medicare Important Message Given:  Yes    [name] 02/13/2015, 11:21 AM

## 2015-02-13 NOTE — Progress Notes (Signed)
Daily Progress Note   Patient Name: [name]       Date: 02/13/2015 DOB: 11-05-1933  Age: 81 y.o. MRN#: [id number] Attending Physician: Nita Sells, MD Primary Care Physician: Curly Rim, MD Admit Date: 02/10/2015  Reason for Consultation/Follow-up: Establishing goals of care and Psychosocial/spiritual support  Subjective:  - Continued conversation ([name] daughter) regarding current medical situation, long term prognosis, values and GOC important to this patient and his family   -she understands that her father is failing to thrive but remains hopeful for improvement   -PMT will continue to support holistically and help family navigate transition to hospice when appropriate   Length of Stay: 3 days  Current Medications: Scheduled Meds:  . amiodarone  100 mg Oral QHS  . ampicillin-sulbactam (UNASYN) IV  1.5 g Intravenous Q6H  . antiseptic oral rinse  7 mL Mouth Rinse BID  . aspirin  81 mg Oral Daily  . azithromycin  500 mg Intravenous Q24H  . carbidopa-levodopa  1 tablet Oral TID  . carvedilol  3.125 mg Oral BID WC  . clopidogrel  75 mg Oral Daily  . erythromycin  1 application Right Eye TID  . feeding supplement  1 Container Oral TID BM  . feeding supplement (ENSURE ENLIVE)  237 mL Oral BID BM  . fluticasone  1 spray Each Nare Daily  . heparin  5,000 Units Subcutaneous 3 times per day  . isosorbide mononitrate  60 mg Oral q morning - 10a  . pantoprazole  40 mg Oral Daily  . sodium chloride  3 mL Intravenous Q12H    Continuous Infusions: . dextrose 5 % and 0.45% NaCl 75 mL/hr at 02/12/15 1203    PRN Meds: acetaminophen, albuterol, albuterol, alum & mag hydroxide-simeth, bisacodyl, diazepam, guaiFENesin-dextromethorphan, hydrALAZINE,  HYDROcodone-acetaminophen, ondansetron **OR** ondansetron (ZOFRAN) IV  Physical Exam: Physical Exam  Constitutional: He appears lethargic. He appears cachectic. He appears ill.  HENT:  Throat secretion less today, moist buccal membranes  Cardiovascular: Normal rate, regular rhythm and normal heart sounds.   Pulmonary/Chest: He has decreased breath sounds in the right lower field and the left lower field. He has rhonchi in the right upper field.  Musculoskeletal:  Generalized weakness and muscle atrophy   Neurological: He appears lethargic. He displays atrophy.  Skin: Skin is warm and dry.  Vital Signs: BP 124/44 mmHg  Pulse 50  Temp(Src) 98.8 F (37.1 C) (Axillary)  Resp 18  Ht '6\' 3"'$  (1.905 m)  Wt 73.5 kg (162 lb 0.6 oz)  BMI 20.25 kg/m2  SpO2 97% SpO2: SpO2: 97 % O2 Device: O2 Device: Nasal Cannula O2 Flow Rate: O2 Flow Rate (L/min): 4 L/min  Intake/output summary:  Intake/Output Summary (Last 24 hours) at 02/13/15 1421 Last data filed at 02/13/15 1402  Gross per 24 hour  Intake 1746.25 ml  Output   1000 ml  Net 746.25 ml   LBM: Last BM Date: 02/11/15 Baseline Weight: Weight: 83.008 kg (183 lb) Most recent weight: Weight: 73.5 kg (162 lb 0.6 oz)       Palliative Assessment/Data: Flowsheet Rows        Most Recent Value   Intake Tab    Referral Department  Hospitalist   Unit at Time of Referral  Cardiac/Telemetry Unit   Palliative Care Primary Diagnosis  Cancer   Date Notified  02/11/15   Palliative Care Type  New Palliative care   Reason for referral  Clarify Goals of Care   Date of Admission  02/10/15   Date first seen by Palliative Care  02/12/15   # of days Palliative referral response time  1 Day(s)   # of days IP prior to Palliative referral  1   Clinical Assessment    Psychosocial & Spiritual Assessment    Palliative Care Outcomes       Additional Data Reviewed: CBC    Component Value Date/Time   WBC 6.4 02/12/2015 0450   WBC 6.4  01/16/2015 1255   RBC 3.33* 02/12/2015 0450   RBC 3.87* 01/16/2015 1255   RBC 4.26 11/10/2013 2048   HGB 10.3* 02/12/2015 0450   HGB 12.0* 01/16/2015 1255   HCT 30.8* 02/12/2015 0450   HCT 36.2* 01/16/2015 1255   PLT 183 02/12/2015 0450   PLT 236 01/16/2015 1255   MCV 92.5 02/12/2015 0450   MCV 93.5 01/16/2015 1255   MCH 30.9 02/12/2015 0450   MCH 31.0 01/16/2015 1255   MCHC 33.4 02/12/2015 0450   MCHC 33.1 01/16/2015 1255   RDW 14.2 02/12/2015 0450   RDW 14.4 01/16/2015 1255   LYMPHSABS 1.4 02/10/2015 0900   LYMPHSABS 1.6 01/16/2015 1255   MONOABS 1.0 02/10/2015 0900   MONOABS 0.7 01/16/2015 1255   EOSABS 0.3 02/10/2015 0900   EOSABS 0.4 01/16/2015 1255   BASOSABS 0.0 02/10/2015 0900   BASOSABS 0.0 01/16/2015 1255    CMP     Component Value Date/Time   NA 144 02/12/2015 0450   NA 138 01/16/2015 1255   K 3.3* 02/12/2015 0450   K 3.5 01/16/2015 1255   CL 109 02/12/2015 0450   CO2 28 02/12/2015 0450   CO2 27 01/16/2015 1255   GLUCOSE 107* 02/12/2015 0450   GLUCOSE 108 01/16/2015 1255   BUN 18 02/12/2015 0450   BUN 11.7 01/16/2015 1255   CREATININE 1.09 02/12/2015 0450   CREATININE 0.9 01/16/2015 1255   CREATININE 0.75 12/28/2013 1152   CALCIUM 12.1* 02/12/2015 0450   CALCIUM 12.2* 01/16/2015 1255   PROT 6.5 02/10/2015 0900   PROT 6.6 01/16/2015 1255   ALBUMIN 3.3* 02/10/2015 0900   ALBUMIN 3.4* 01/16/2015 1255   AST 24 02/10/2015 0900   AST 63* 01/16/2015 1255   ALT 29 02/10/2015 0900   ALT 43 01/16/2015 1255   ALKPHOS 85 02/10/2015 0900   ALKPHOS 88 01/16/2015 1255   BILITOT  0.5 02/10/2015 0900   BILITOT 0.66 01/16/2015 1255   GFRNONAA >60 02/12/2015 0450   GFRAA >60 02/12/2015 0450       Problem List:  Patient Active Problem List   Diagnosis Date Noted  . DNR (do not resuscitate) discussion 02/12/2015  . Palliative care encounter 02/12/2015  . Adult failure to thrive   . Dysphagia   . Hypokalemia 02/10/2015  . Acute bronchitis 02/10/2015  .  Bell's palsy   . Protein-calorie malnutrition, severe ([place]) 11/24/2014  . Malnutrition ([place]) 11/24/2014  . Hyperglycemia 11/23/2014  . Decreased oral intake 11/23/2014  . Herpes zoster 11/23/2014  . H/O: GI bleed- Sept 2015 01/05/2014  . Bradycardia- HR 45-55 01/05/2014  . Lower extremity edema 12/12/2013  . Claudication ([place]) 11/20/2013  . Orthostatic hypotension 11/11/2013  . Anemia, iron deficiency 11/11/2013  . Dizziness 11/10/2013  . Left carotid bruit 08/30/2013  . SVT (supraventricular tachycardia) ([place]) 08/21/2012  . Cardiomyopathy, ischemic-EF 30-35% 08/21/2012  . Tobacco abuse 08/21/2012  . COPD (chronic obstructive pulmonary disease) ([place]) 08/21/2012  . Unstable angina ([place]) 08/19/2012  . Sinusitis acute 10/24/2010  . CAD- CFX PCI '06, CTO RCA, distal CFX   . PVC (premature ventricular contraction)   . HTN (hypertension)   . Lung cancer ([place])   . Paroxysmal a-fib- NSR on Amiodarone   . PAD-Lt SFA PTA 01/04/14      Palliative Care Assessment & Plan    1.Code Status:  DNR    Code Status Orders        Start     Ordered   02/12/15 1248  Do not attempt resuscitation (DNR)   Continuous    Question Answer Comment  In the event of cardiac or respiratory ARREST Do not call a "code blue"   In the event of cardiac or respiratory ARREST Do not perform Intubation, CPR, defibrillation or ACLS   In the event of cardiac or respiratory ARREST Use medication by any route, position, wound care, and other measures to relive pain and suffering. May use oxygen, suction and manual treatment of airway obstruction as needed for comfort.      02/12/15 1247    Advance Directive Documentation        Most Recent Value   Type of Advance Directive  Healthcare Power of Attorney, Living will   Pre-existing out of facility DNR order (yellow form or pink MOST form)     "MOST" Form in Place?         Goals of Care/Additional Recommendations:   Limitations on Scope of Treatment: No  Artificial Feeding  Desire for further Chaplaincy support:no  Psycho-social Needs: Education on Hospice and Grief/Bereavement Support     Palliative Prophylaxis:   Aspiration, Bowel Regimen, Delirium Protocol, Eye Care, Frequent Pain Assessment and Oral Care   Prognosis: Less than 6 months, likely much less without life prolonging intervetnions   Discharge Planning:  Home with Home Health vs home with hospice (Pending)   Care plan was discussed with Dr Verlon Au  Thank you for allowing the Palliative Medicine Team to assist in the care of this patient.   Time In: 1215 Time Out: 1250 Total Time 35 min Prolonged Time Billed  no         Knox Royalty, NP  02/13/2015, 2:21 PM  Please contact Palliative Medicine Team phone at [phone] for questions and concerns.

## 2015-02-14 LAB — CBC WITH DIFFERENTIAL/PLATELET
BASOS ABS: 0 10*3/uL (ref 0.0–0.1)
Basophils Relative: 0 %
Eosinophils Absolute: 0.3 10*3/uL (ref 0.0–0.7)
Eosinophils Relative: 5 %
HEMATOCRIT: 33.3 % — AB (ref 39.0–52.0)
Hemoglobin: 11.1 g/dL — ABNORMAL LOW (ref 13.0–17.0)
LYMPHS PCT: 19 %
Lymphs Abs: 1.2 10*3/uL (ref 0.7–4.0)
MCH: 30.5 pg (ref 26.0–34.0)
MCHC: 33.3 g/dL (ref 30.0–36.0)
MCV: 91.5 fL (ref 78.0–100.0)
MONO ABS: 0.7 10*3/uL (ref 0.1–1.0)
Monocytes Relative: 11 %
NEUTROS ABS: 4.2 10*3/uL (ref 1.7–7.7)
Neutrophils Relative %: 65 %
Platelets: 207 10*3/uL (ref 150–400)
RBC: 3.64 MIL/uL — AB (ref 4.22–5.81)
RDW: 13.8 % (ref 11.5–15.5)
WBC: 6.5 10*3/uL (ref 4.0–10.5)

## 2015-02-14 LAB — BASIC METABOLIC PANEL
ANION GAP: 8 (ref 5–15)
BUN: 16 mg/dL (ref 6–20)
CHLORIDE: 106 mmol/L (ref 101–111)
CO2: 27 mmol/L (ref 22–32)
Calcium: 12.3 mg/dL — ABNORMAL HIGH (ref 8.9–10.3)
Creatinine, Ser: 0.93 mg/dL (ref 0.61–1.24)
GFR calc Af Amer: >60 mL/min (ref >60)
GFR calc non Af Amer: >60 mL/min (ref >60)
GLUCOSE: 121 mg/dL — AB (ref 65–99)
POTASSIUM: 3.1 mmol/L — AB (ref 3.5–5.1)
Sodium: 141 mmol/L (ref 135–145)

## 2015-02-14 MED ORDER — POTASSIUM CHLORIDE CRYS ER 20 MEQ PO TBCR
40.0000 meq | EXTENDED_RELEASE_TABLET | Freq: Once | ORAL | Status: AC
  Administered 2015-02-14: 40 meq via ORAL
  Filled 2015-02-14 (×2): qty 2

## 2015-02-14 MED ORDER — AMOXICILLIN-POT CLAVULANATE 875-125 MG PO TABS
1.0000 | ORAL_TABLET | Freq: Two times a day (BID) | ORAL | Status: DC
  Administered 2015-02-14: 1 via ORAL
  Filled 2015-02-14: qty 1

## 2015-02-14 NOTE — Progress Notes (Signed)
Patient probably has an overallprognosis of less than 3 months of life expectancy I will reasess him in am and see how he looks

## 2015-02-14 NOTE — Progress Notes (Signed)
Daily Progress Note   Patient Name: [name]       Date: 02/14/2015 DOB: [birth year]  Age: 81 y.o. MRN#: [id number] Attending Physician: Nita Sells, MD Primary Care Physician: Curly Rim, MD Admit Date: 02/10/2015  Reason for Consultation/Follow-up: Establishing goals of care and Psychosocial/spiritual support  Subjective:  - Continued conversation ([name] daughter and patient's two sisters) regarding current medical situation, long term prognosis, values and GOC important to this patient and his family   -she understands that her father is failing to thrive but remains hopeful for improvement, she is realizing that his long term prognosis is poor.  She now realizes that she cannot care for him at home  -PMT will continue to support holistically and help family navigate transition    Length of Stay: 4 days  Current Medications: Scheduled Meds:  . amiodarone  100 mg Oral QHS  . amoxicillin-clavulanate  1 tablet Oral Q12H  . antiseptic oral rinse  7 mL Mouth Rinse BID  . aspirin  81 mg Oral Daily  . carbidopa-levodopa  1 tablet Oral TID  . carvedilol  3.125 mg Oral BID WC  . clopidogrel  75 mg Oral Daily  . erythromycin  1 application Right Eye TID  . feeding supplement  1 Container Oral TID BM  . feeding supplement (ENSURE ENLIVE)  237 mL Oral BID BM  . fluticasone  1 spray Each Nare Daily  . heparin  5,000 Units Subcutaneous 3 times per day  . isosorbide mononitrate  60 mg Oral q morning - 10a  . pantoprazole  40 mg Oral Daily  . sodium chloride  3 mL Intravenous Q12H    Continuous Infusions: . dextrose 5 % and 0.45% NaCl 75 mL/hr at 02/14/15 0845    PRN Meds: acetaminophen, albuterol, albuterol, alum & mag hydroxide-simeth, bisacodyl, diazepam,  guaiFENesin-dextromethorphan, hydrALAZINE, HYDROcodone-acetaminophen, ondansetron **OR** ondansetron (ZOFRAN) IV  Physical Exam: Physical Exam  Constitutional: He appears lethargic. He appears cachectic. He appears ill.  HENT:  Throat secretion less today, moist buccal membranes  Cardiovascular: Normal rate, regular rhythm and normal heart sounds.   Pulmonary/Chest: He has decreased breath sounds in the right lower field and the left lower field. He has rhonchi in the right upper field.  Musculoskeletal:  Generalized weakness and muscle atrophy   Neurological: He appears  lethargic. He displays atrophy.  Skin: Skin is warm and dry.                Vital Signs: BP 118/54 mmHg  Pulse 56  Temp(Src) 97.8 F (36.6 C) (Oral)  Resp 18  Ht '6\' 3"'$  (1.905 m)  Wt 73.2 kg (161 lb 6 oz)  BMI 20.17 kg/m2  SpO2 97% SpO2: SpO2: 97 % O2 Device: O2 Device: Not Delivered O2 Flow Rate: O2 Flow Rate (L/min): 4 L/min  Intake/output summary:   Intake/Output Summary (Last 24 hours) at 02/14/15 1435 Last data filed at 02/14/15 1300  Gross per 24 hour  Intake   2370 ml  Output    750 ml  Net   1620 ml   LBM: Last BM Date: 02/11/15 Baseline Weight: Weight: 83.008 kg (183 lb) Most recent weight: Weight: 73.2 kg (161 lb 6 oz)       Palliative Assessment/Data: Flowsheet Rows        Most Recent Value   Intake Tab    Referral Department  Hospitalist   Unit at Time of Referral  Cardiac/Telemetry Unit   Palliative Care Primary Diagnosis  Cancer   Date Notified  02/11/15   Palliative Care Type  New Palliative care   Reason for referral  Clarify Goals of Care   Date of Admission  02/10/15   Date first seen by Palliative Care  02/12/15   # of days Palliative referral response time  1 Day(s)   # of days IP prior to Palliative referral  1   Clinical Assessment    Psychosocial & Spiritual Assessment    Palliative Care Outcomes       Additional Data Reviewed: CBC    Component Value Date/Time    WBC 6.5 02/14/2015 0758   WBC 6.4 01/16/2015 1255   RBC 3.64* 02/14/2015 0758   RBC 3.87* 01/16/2015 1255   RBC 4.26 11/10/2013 2048   HGB 11.1* 02/14/2015 0758   HGB 12.0* 01/16/2015 1255   HCT 33.3* 02/14/2015 0758   HCT 36.2* 01/16/2015 1255   PLT 207 02/14/2015 0758   PLT 236 01/16/2015 1255   MCV 91.5 02/14/2015 0758   MCV 93.5 01/16/2015 1255   MCH 30.5 02/14/2015 0758   MCH 31.0 01/16/2015 1255   MCHC 33.3 02/14/2015 0758   MCHC 33.1 01/16/2015 1255   RDW 13.8 02/14/2015 0758   RDW 14.4 01/16/2015 1255   LYMPHSABS 1.2 02/14/2015 0758   LYMPHSABS 1.6 01/16/2015 1255   MONOABS 0.7 02/14/2015 0758   MONOABS 0.7 01/16/2015 1255   EOSABS 0.3 02/14/2015 0758   EOSABS 0.4 01/16/2015 1255   BASOSABS 0.0 02/14/2015 0758   BASOSABS 0.0 01/16/2015 1255    CMP     Component Value Date/Time   NA 141 02/14/2015 0758   NA 138 01/16/2015 1255   K 3.1* 02/14/2015 0758   K 3.5 01/16/2015 1255   CL 106 02/14/2015 0758   CO2 27 02/14/2015 0758   CO2 27 01/16/2015 1255   GLUCOSE 121* 02/14/2015 0758   GLUCOSE 108 01/16/2015 1255   BUN 16 02/14/2015 0758   BUN 11.7 01/16/2015 1255   CREATININE 0.93 02/14/2015 0758   CREATININE 0.9 01/16/2015 1255   CREATININE 0.75 12/28/2013 1152   CALCIUM 12.3* 02/14/2015 0758   CALCIUM 12.2* 01/16/2015 1255   PROT 6.5 02/10/2015 0900   PROT 6.6 01/16/2015 1255   ALBUMIN 3.3* 02/10/2015 0900   ALBUMIN 3.4* 01/16/2015 1255   AST 24 02/10/2015 0900  AST 63* 01/16/2015 1255   ALT 29 02/10/2015 0900   ALT 43 01/16/2015 1255   ALKPHOS 85 02/10/2015 0900   ALKPHOS 88 01/16/2015 1255   BILITOT 0.5 02/10/2015 0900   BILITOT 0.66 01/16/2015 1255   GFRNONAA >60 02/14/2015 0758   GFRAA >60 02/14/2015 0758       Problem List:  Patient Active Problem List   Diagnosis Date Noted  . Pressure ulcer 02/13/2015  . Aspiration into airway   . DNR (do not resuscitate) discussion 02/12/2015  . Palliative care encounter 02/12/2015  . Adult  failure to thrive   . Dysphagia   . Hypokalemia 02/10/2015  . Acute bronchitis 02/10/2015  . Bell's palsy   . Protein-calorie malnutrition, severe ([place]) 11/24/2014  . Malnutrition ([place]) 11/24/2014  . Hyperglycemia 11/23/2014  . Decreased oral intake 11/23/2014  . Herpes zoster 11/23/2014  . H/O: GI bleed- Sept 2015 01/05/2014  . Bradycardia- HR 45-55 01/05/2014  . Lower extremity edema 12/12/2013  . Claudication ([place]) 11/20/2013  . Orthostatic hypotension 11/11/2013  . Anemia, iron deficiency 11/11/2013  . Dizziness 11/10/2013  . Left carotid bruit 08/30/2013  . SVT (supraventricular tachycardia) ([place]) 08/21/2012  . Cardiomyopathy, ischemic-EF 30-35% 08/21/2012  . Tobacco abuse 08/21/2012  . COPD (chronic obstructive pulmonary disease) ([place]) 08/21/2012  . Unstable angina ([place]) 08/19/2012  . Sinusitis acute 10/24/2010  . CAD- CFX PCI '06, CTO RCA, distal CFX   . PVC (premature ventricular contraction)   . HTN (hypertension)   . Lung cancer ([place])   . Paroxysmal a-fib- NSR on Amiodarone   . PAD-Lt SFA PTA 01/04/14      Palliative Care Assessment & Plan    1.Code Status:  DNR    Code Status Orders        Start     Ordered   02/12/15 1248  Do not attempt resuscitation (DNR)   Continuous    Question Answer Comment  In the event of cardiac or respiratory ARREST Do not call a "code blue"   In the event of cardiac or respiratory ARREST Do not perform Intubation, CPR, defibrillation or ACLS   In the event of cardiac or respiratory ARREST Use medication by any route, position, wound care, and other measures to relive pain and suffering. May use oxygen, suction and manual treatment of airway obstruction as needed for comfort.      02/12/15 1247    Advance Directive Documentation        Most Recent Value   Type of Advance Directive  Healthcare Power of Attorney, Living will   Pre-existing out of facility DNR order (yellow form or pink MOST form)     "MOST" Form in Place?          Goals of Care/Additional Recommendations:   Limitations on Scope of Treatment: No Artificial Feeding  Desire for further Chaplaincy support:no  Psycho-social Needs: Education on Hospice and Grief/Bereavement Support     Palliative Prophylaxis:   Aspiration, Bowel Regimen, Delirium Protocol, Eye Care, Frequent Pain Assessment and Oral Care   Prognosis: Less than 6 months, likely much less without life prolonging intervetnions   Discharge Planning:   Today [name] is struggling with the realization that she cannot care for her father at home and is consider residential hospice.  They tell me that they will meet with Dr Verlon Au tomorrow at 1000 for discharge plan discussion   Care plan was discussed with Dr Verlon Au  Thank you for allowing the Palliative Medicine Team to assist in  the care of this patient.   Time In: 1345 Time Out: 1445 Total Time 60  min Prolonged Time Billed  no         Knox Royalty, NP  02/14/2015, 2:35 PM  Please contact Palliative Medicine Team phone at [phone] for questions and concerns.

## 2015-02-14 NOTE — Progress Notes (Signed)
[name] MWN:[id number] DOB: Apr 15, 1933 DOA: 02/10/2015 PCP: Curly Rim, MD  Brief narrative: 72 ? Recent admission 9/30-->11/30/14 for Herpes and Ramsey-Hunt disease since 10/2014-found on that amdission as well to have dysphagia P Afib on AMiodarone , CHad2Vasc2 score~3 but poor AC candidate CAD c DEDES to LCX 2006, Cath 2011 COPD H/o Lung Ca HTn GIB 10/2013 intermittetn claudication s.p R commin iliac stenting Chr Dizzyness Fe def anemia Tob abuse  Admitted with aki, hypokalemia and ? CAP later felt to be more aspiration PNa related   Antibiotics:  unasyn   Subjective   Sleepy agitated  and awake overnight. Now falling back t sleep and less repsonsive ROs is not reliable    Objective       Objective: Filed Vitals:   02/13/15 2119 02/14/15 0500 02/14/15 0653 02/14/15 0954  BP: 142/54  160/62   Pulse: 54  61 69  Temp: 98.1 F (36.7 C)  97.3 F (36.3 C)   TempSrc: Axillary  Oral   Resp: 18  18   Height:      Weight:  73.2 kg (161 lb 6 oz)    SpO2: 96%  100%     Intake/Output Summary (Last 24 hours) at 02/14/15 1140 Last data filed at 02/14/15 0600  Gross per 24 hour  Intake   2250 ml  Output    900 ml  Net   1350 ml    Exam:  General: sleepy, rouasable Cardiovascular: s1 s 2no m/r/g Respiratory:  cta b no issues  Abdomen: soft nt nd  Skin no le edema, has a dressing on sacrum.  NO LE swelling Neuro sleepy  Data Reviewed: Basic Metabolic Panel:  Recent Labs Lab 02/10/15 0900 02/11/15 0112 02/12/15 0450 02/14/15 0758  NA 141 141 144 141  K 2.7* 3.4* 3.3* 3.1*  CL 103 109 109 106  CO2 '31 26 28 27  '$ GLUCOSE 101* 91 107* 121*  BUN 21* 21* 18 16  CREATININE 1.26* 1.03 1.09 0.93  CALCIUM 12.1* 11.6* 12.1* 12.3*  MG  --  1.6*  --   --    Liver Function Tests:  Recent Labs Lab 02/10/15 0900  AST 24  ALT 29  ALKPHOS 85  BILITOT 0.5  PROT 6.5  ALBUMIN 3.3*   No results for input(s): LIPASE, AMYLASE in the last 168  hours. No results for input(s): AMMONIA in the last 168 hours. CBC:  Recent Labs Lab 02/10/15 0900 02/11/15 0112 02/12/15 0450 02/14/15 0758  WBC 8.4 7.6 6.4 6.5  NEUTROABS 5.7  --   --  4.2  HGB 11.8* 10.9* 10.3* 11.1*  HCT 35.6* 32.5* 30.8* 33.3*  MCV 92.2 91.8 92.5 91.5  PLT 211 207 183 207   Cardiac Enzymes:  Recent Labs Lab 02/10/15 1339 02/10/15 1912 02/11/15 0112  TROPONINI 0.07* 0.07* 0.06*   BNP: Invalid input(s): POCBNP CBG: No results for input(s): GLUCAP in the last 168 hours.  Recent Results (from the past 240 hour(s))  Culture, blood (routine x 2)     Status: None (Preliminary result)   Collection Time: 02/10/15  2:35 PM  Result Value Ref Range Status   Specimen Description BLOOD LEFT ARM  Final   Special Requests BOTTLES DRAWN AEROBIC AND ANAEROBIC 5CC  Final   Culture   Final    NO GROWTH 3 DAYS Performed at [facility]    Report Status PENDING  Incomplete  Culture, blood (routine x 2)     Status: None (Preliminary result)  Collection Time: 02/10/15  2:42 PM  Result Value Ref Range Status   Specimen Description BLOOD LEFT HAND  Final   Special Requests BOTTLES DRAWN AEROBIC AND ANAEROBIC 5CC  Final   Culture   Final    NO GROWTH 3 DAYS Performed at [facility]    Report Status PENDING  Incomplete  Culture, respiratory (NON-Expectorated)     Status: None   Collection Time: 02/10/15  4:30 PM  Result Value Ref Range Status   Specimen Description SPUTUM  Final   Special Requests NONE  Final   Gram Stain   Final    MODERATE WBC FEW SQUAMOUS EPITHELIAL CELLS PRESENT MODERATE GRAM POSITIVE COCCI IN CLUSTERS FEW YEAST Performed at [facility]    Culture   Final    NORMAL OROPHARYNGEAL FLORA Performed at [facility]    Report Status 02/12/2015 FINAL  Final  Culture, expectorated sputum-assessment     Status: None   Collection Time: 02/10/15  4:35 PM  Result Value Ref Range Status   Specimen Description  SPUTUM  Final   Special Requests NONE  Final   Sputum evaluation   Final    THIS SPECIMEN IS ACCEPTABLE. RESPIRATORY CULTURE REPORT TO FOLLOW.   Report Status 02/10/2015 FINAL  Final     Studies:              All Imaging reviewed and is as per above notation   Scheduled Meds: . amiodarone  100 mg Oral QHS  . ampicillin-sulbactam (UNASYN) IV  1.5 g Intravenous Q6H  . antiseptic oral rinse  7 mL Mouth Rinse BID  . aspirin  81 mg Oral Daily  . carbidopa-levodopa  1 tablet Oral TID  . carvedilol  3.125 mg Oral BID WC  . clopidogrel  75 mg Oral Daily  . erythromycin  1 application Right Eye TID  . feeding supplement  1 Container Oral TID BM  . feeding supplement (ENSURE ENLIVE)  237 mL Oral BID BM  . fluticasone  1 spray Each Nare Daily  . heparin  5,000 Units Subcutaneous 3 times per day  . isosorbide mononitrate  60 mg Oral q morning - 10a  . pantoprazole  40 mg Oral Daily  . potassium chloride  40 mEq Oral Once  . sodium chloride  3 mL Intravenous Q12H   Continuous Infusions: . dextrose 5 % and 0.45% NaCl 75 mL/hr at 02/14/15 0845     Assessment/Plan:  Acute bronchitis with early pneumonia, aspiration, acute hypoxic respiratory failure - continue IV Unasyn, Zithromax -transition Unasyn to PO augmentin po bid if able to take PO-stop date 12/27 - Follow sputum cultures, blood cultures negative so far, continue O2 - swallow evaluation done 12/19, severe dysphagia with aspiration, grossly weak with poor cough mechanism. MBS not indicated as it will not change patient's outcome and there is not a consistent see that patient will not aspirate. Speech therapy recommending liquids at this time.  - Patient's daughter and son-in-law at the bedside, has been feeding clear liquid diet but had more aspiration on full liquid -backed down to CLD on 12/21 -Pallaitive to see and comment -suspect will d/c home with Hospice in the next 24 hours  Parkinsonism -was taken off of  Carbi-dopa -will re-initiate at low doses and see if any differences with mentation -if not, this is probably all in keeping with aspiration and mentation would not likely change long term to    CAD- CFX PCI '06, CTO RCA, distal  CFX with mildly elevated troponins - Currently having no chest pain, has dyspnea, could be due to pneumonia,  - 2-D echo 12/19 showed EF of 35-40% with moderately reduced systolic function. Reviewed prior office notes, Dr. 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 - Follows with ophthalmologist Dr. Luvenia Heller, right eyelid has been sutured - continue supportive care, outpatient follow-up with his ophthalmologist after discharge. Per daughter at the bedside, patient has eye surgery scheduled on 12/28 it is postponed  Ischemic cardiomyopathy with chronic systolic heart failure EF 35-40% - 2-D echocardiogram showed EF of 35-40% - continue Imdur, low-dose Coreg, hold ARB due to AKI   Paroxysmal a-fib- NSR on Amiodarone - Continue amiodarone, not on anticoagulation per cardiology due to high fall risk, poor balance and deconditioning.  Hypokalemia Replaced  Low TSH - T4 2.9, TSH 0.05, T3 normal, follow with PCP.   Family updated bedside long discussion lebning towards pallaitive care and comfort in 24-48 hr if no improvement   Verneita Griffes, MD  [facility] Pager [phone] 02/14/2015, 11:40 AM    LOS: 4 days

## 2015-02-14 NOTE — Progress Notes (Signed)
Spoke with pt's daughter concerning home health or hospice. Daughter selected [facility] ([facility] [place] for in pt hospice.

## 2015-02-15 LAB — COMPREHENSIVE METABOLIC PANEL
ALBUMIN: 2.7 g/dL — AB (ref 3.5–5.0)
ALT: 9 U/L — AB (ref 17–63)
AST: 15 U/L (ref 15–41)
Alkaline Phosphatase: 78 U/L (ref 38–126)
Anion gap: 7 (ref 5–15)
BUN: 14 mg/dL (ref 6–20)
CHLORIDE: 106 mmol/L (ref 101–111)
CO2: 26 mmol/L (ref 22–32)
Calcium: 12.1 mg/dL — ABNORMAL HIGH (ref 8.9–10.3)
Creatinine, Ser: 0.89 mg/dL (ref 0.61–1.24)
GFR calc Af Amer: >60 mL/min (ref >60)
GFR calc non Af Amer: >60 mL/min (ref >60)
GLUCOSE: 115 mg/dL — AB (ref 65–99)
POTASSIUM: 2.8 mmol/L — AB (ref 3.5–5.1)
Sodium: 139 mmol/L (ref 135–145)
Total Bilirubin: 0.6 mg/dL (ref 0.3–1.2)
Total Protein: 6 g/dL — ABNORMAL LOW (ref 6.5–8.1)

## 2015-02-15 LAB — CULTURE, BLOOD (ROUTINE X 2)
Culture: NO GROWTH
Culture: NO GROWTH

## 2015-02-15 LAB — CBC
HCT: 30.9 % — ABNORMAL LOW (ref 39.0–52.0)
Hemoglobin: 10.4 g/dL — ABNORMAL LOW (ref 13.0–17.0)
MCH: 29.9 pg (ref 26.0–34.0)
MCHC: 33.7 g/dL (ref 30.0–36.0)
MCV: 88.8 fL (ref 78.0–100.0)
PLATELETS: 211 10*3/uL (ref 150–400)
RBC: 3.48 MIL/uL — ABNORMAL LOW (ref 4.22–5.81)
RDW: 13.7 % (ref 11.5–15.5)
WBC: 5.8 10*3/uL (ref 4.0–10.5)

## 2015-02-15 MED ORDER — MORPHINE SULFATE (CONCENTRATE) 10 MG/0.5ML PO SOLN: 10.0000 mg | ORAL | Status: DC | PRN

## 2015-02-15 MED ORDER — SCOPOLAMINE 1 MG/3DAYS TD PT72: 1.0000 | MEDICATED_PATCH | TRANSDERMAL | Status: AC

## 2015-02-15 MED ORDER — MORPHINE SULFATE (CONCENTRATE) 10 MG/0.5ML PO SOLN: 5.0000 mg | ORAL | Status: DC | PRN

## 2015-02-15 MED ORDER — LORAZEPAM 2 MG/ML PO CONC: 1.0000 mg | ORAL | Status: AC | PRN

## 2015-02-15 MED ORDER — LORAZEPAM 1 MG PO TABS: 1.0000 mg | ORAL_TABLET | Freq: Four times a day (QID) | ORAL | Status: DC | PRN

## 2015-02-15 MED ORDER — MORPHINE SULFATE (PF) 2 MG/ML IV SOLN
1.0000 mg | INTRAVENOUS | Status: DC | PRN
  Administered 2015-02-15: 1 mg via INTRAVENOUS
  Filled 2015-02-15: qty 1

## 2015-02-15 MED ORDER — MORPHINE SULFATE 20 MG/5ML PO SOLN: 5.0000 mg | ORAL | Status: AC | PRN

## 2015-02-15 MED ORDER — POTASSIUM CHLORIDE CRYS ER 20 MEQ PO TBCR: 40.0000 meq | EXTENDED_RELEASE_TABLET | Freq: Two times a day (BID) | ORAL | Status: DC

## 2015-02-15 NOTE — Discharge Summary (Signed)
Physician Discharge Summary  GIRARD KOONTZ KNL:[id number] DOB: 11-Mar-1933 DOA: 02/10/2015  PCP: Curly Rim, MD  Admit date: 02/10/2015 Discharge date: 02/15/2015  Time spent: 45  Recommendations for Outpatient Follow-up:  1. Full Hospice trajectory 2. Will be d/c to [facility] for EOL care 3. Will need oxygen on d.c    Discharge Diagnoses:  Principal Problem:   Acute bronchitis Active Problems:   CAD- CFX PCI '06, CTO RCA, distal CFX   PVC (premature ventricular contraction)   Paroxysmal a-fib- NSR on Amiodarone   Cardiomyopathy, ischemic-EF 30-35%   Bell's palsy   Hypokalemia   DNR (do not resuscitate) discussion   Palliative care encounter   Adult failure to thrive   Dysphagia   Pressure ulcer   Aspiration into airway   Discharge Condition: Guarded  Diet recommendation: Comfort feeds whenever consistencies pleasurable to patient  Filed Weights   02/10/15 0817 02/10/15 1551 02/11/15 0526  Weight: 83.008 kg (183 lb) 73.664 kg (162 lb 6.4 oz) 73.5 kg (162 lb 0.6 oz)    History of present illness:  71 ? Recent admission 9/30-->11/30/14 for Herpes and Ramsey-Hunt disease since 10/2014-found on that amdission as well to have dysphagia P Afib on AMiodarone , CHad2Vasc2 score~3 but poor AC candidate CAD c DEDES to LCX 2006, Cath 2011 COPD H/o Lung Ca HTn GIB 10/2013 intermittetn claudication s.p R common iliac stenting Chr Dizzyness Fe def anemia Tob abuse  Admitted with aki, hypokalemia and ? CAP later felt to be more aspiration PNa related  Hospital Course:  Acute bronchitis with early pneumonia, aspiration, acute hypoxic respiratory failure - continue IV Unasyn, Zithromax -transition Unasyn to PO augmentin po bid if able to take PO-stop date 12/27 - Follow sputum cultures, blood cultures negative so far, continue O2 - swallow evaluation done 12/19, severe dysphagia with aspiration, grossly weak with poor cough mechanism. MBS not indicated as it  will not change patient's outcome and there is not a consistent see that patient will not aspirate. Speech therapy recommending liquids at this time.  -Patient was seen with palliative care house so that patient would benefit from full comfort care and we are in the process of deciding whether patient will be discharged to either freestanding hospice facility versus home with hospice -Multiple medications were delineated and simplified and narrowed and patient was then transitioned to comfort medications including Ativan, morphine by mouth liquids and scopolamine patch  Parkinsonism -was taken off of Carbi-dopa -will re-initiate at low doses and see if any differences with mentation -Unlikely to make a huge difference   CAD- CFX PCI '06, CTO RCA, distal CFX with mildly elevated troponins - Currently having no chest pain, has dyspnea, could be due to pneumonia,  - 2-D echo 12/19 showed EF of 35-40% with moderately reduced systolic function. Reviewed prior office notes, Dr. 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 - Follows with ophthalmologist Dr. Luvenia Heller, right eyelid has been sutured -No further workup or follow-up needed  Ischemic cardiomyopathy with chronic systolic heart failure EF 35-40% - 2-D echocardiogram showed EF of 35-40% -Medications discontinued at above   Paroxysmal a-fib- NSR on Amiodarone - Continue amiodarone alone, not on anticoagulation per cardiology due to high fall risk, poor balance and deconditioning.  Hypokalemia Replaced  Low TSH - T4 2.9, TSH 0.05,  T3   Consultations:  Palliative care  Discharge Exam: Filed Vitals:   02/14/15 2126 02/15/15 0717  BP: 129/54 120/65  Pulse: 106 100  Temp: 97.5 F (36.4 C) 97.5 F (36.4 C)  Resp:  18 18    General: Alert intermittently only and confused without open did not make a huge change to his mentation Cardiovascular: s1 s2 no m/r/g Respiratory: clear no added sound  Discharge Instructions    Current Discharge Medication List    START taking these medications   Details  LORazepam (ATIVAN) 2 MG/ML concentrated solution Take 0.5 mLs (1 mg total) by mouth every 4 (four) hours as needed for anxiety. Qty: 30 mL, Refills: 0    morphine 20 MG/5ML solution Take 1.3 mLs (5.2 mg total) by mouth every 2 (two) hours as needed for pain. Qty: 100 mL, Refills: 0    scopolamine (TRANSDERM-SCOP, 1.5 MG,) 1 MG/3DAYS Place 1 patch (1.5 mg total) onto the skin every 3 (three) days. Qty: 10 patch, Refills: 12      CONTINUE these medications which have NOT CHANGED   Details  acetaminophen (TYLENOL) 325 MG tablet Take 2 tablets (650 mg total) by mouth every 4 (four) hours as needed for headache or mild pain.    albuterol (PROVENTIL) (2.5 MG/3ML) 0.083% nebulizer solution Take 2.5 mg by nebulization every 4 (four) hours as needed for wheezing or shortness of breath.     amiodarone (PACERONE) 100 MG tablet Take 100 mg by mouth at bedtime.    amLODipine (NORVASC) 10 MG tablet Take 5 mg by mouth every morning.     diazepam (VALIUM) 2 MG tablet Take 1 tablet (2 mg total) by mouth every 8 (eight) hours as needed for muscle spasms. Qty: 12 tablet, Refills: 0    erythromycin ophthalmic ointment Place 1 application into the right eye 3 (three) times daily.    carbidopa-levodopa (SINEMET IR) 25-100 MG tablet Take 1 tablet by mouth 3 (three) times daily. Reported on 02/10/2015      STOP taking these medications     aspirin 81 MG chewable tablet      clopidogrel (PLAVIX) 75 MG tablet      feeding supplement (BOOST / RESOURCE BREEZE) LIQD      ferrous sulfate 325 (65 FE) MG tablet      fexofenadine (ALLEGRA) 180 MG tablet      HYDROcodone-acetaminophen (NORCO/VICODIN) 5-325 MG tablet       isosorbide mononitrate (IMDUR) 60 MG 24 hr tablet      lactose free nutrition (BOOST PLUS) LIQD      losartan (COZAAR) 100 MG tablet      megestrol (MEGACE ES) 625 MG/5ML suspension      nitroGLYCERIN (NITROSTAT) 0.4 MG SL tablet      pantoprazole (PROTONIX) 40 MG tablet      polyethylene glycol (MIRALAX / GLYCOLAX) packet      PROAIR HFA 108 (90 BASE) MCG/ACT inhaler      lidocaine (XYLOCAINE) 2 % solution      nystatin (MYCOSTATIN) 100000 UNIT/ML suspension        Allergies  Allergen Reactions  . Ciprofloxacin Other (See Comments)    Joint pain  . Doxycycline Other (See Comments)    arthralgias  . Atorvastatin Other (See Comments)    Joint swelling  . Sulfa Drugs Cross Reactors Rash      The results of significant diagnostics from this hospitalization (including imaging, microbiology, ancillary and laboratory) are listed below for reference.  Significant Diagnostic Studies: Dg Chest 2 View  02/10/2015  CLINICAL DATA:  Increasing weakness, low back pain and lower abdominal pain, productive cough with green sputum. History of lung cancer in 2008 status post chemotherapy and radiation therapy. EXAM: CHEST  2 VIEW COMPARISON:  Chest CT dated 01/16/2015 and chest x-ray dated 11/22/2014. FINDINGS: Heart size is upper normal, unchanged. Overall cardiomediastinal silhouette is stable in size and configuration. Surgical clips again noted in the left hilum related to patient's previous partial left lung resection. The chronic pleural-parenchymal thickening/fibrosis at the left lung apex is unchanged. Additional mild scarring/fibrosis at the left lung base is unchanged. No new lung findings seen. Again noted is the diffuse DISH throughout the slightly kyphotic thoracic spine. Osseous structures about the chest are otherwise unremarkable. IMPRESSION: Stable chest x-ray. No evidence of acute cardiopulmonary abnormality. No evidence of pneumonia. Electronically Signed   By: Franki Cabot M.D.   On: 02/10/2015 10:43   Ct Chest Wo Contrast  01/16/2015  CLINICAL DATA:  Lung cancer diagnosed 2008. Chemotherapy and radiation therapy complete. EXAM: CT CHEST WITHOUT CONTRAST TECHNIQUE: Multidetector CT imaging of the chest was performed following the standard protocol without IV contrast. COMPARISON:  CT thorax 01/15/2014 FINDINGS: Mediastinum/Nodes: No axillary or supraclavicular lymphadenopathy. No mediastinal hilar lymphadenopathy. No pericardial fluid. Lungs/Pleura: There is volume loss in the LEFT hemi thorax. Surgical clips in the LEFT superior hilum. No new nodularity. There is peripheral pleural parenchymal thickening at the LEFT lung apex not changed from prior. No new pulmonary nodules. Upper abdomen: Limited view of the liver, kidneys, pancreas are unremarkable. Normal adrenal glands. Probable LEFT adrenal adenoma incompletely imaged. Musculoskeletal: No aggressive osseous lesion. Bulky continuous spurring of the thoracic spine. IMPRESSION: 1. Stable postsurgical and post therapy change in LEFT hemi thorax without evidence of local recurrence or metastasis. 2. Diffuse idiopathic skeletal hyperostosis of the thoracic spine. Electronically Signed   By: Suzy Bouchard M.D.   On: 01/16/2015 14:57   Dg Chest Port 1 View  02/13/2015  CLINICAL DATA:  Possible aspiration.  History of pneumonia. EXAM: PORTABLE CHEST 1 VIEW COMPARISON:  02/10/2015 FINDINGS: Postoperative changes on the left. COPD/ hyperinflation. Scarring in the left base. Biapical scarring and pleural thickening, left greater than right, stable. No confluent airspace opacities or effusions. Heart is normal size. IMPRESSION: Hyperinflation/ COPD with chronic changes in the apices. Postoperative changes on the left. No acute findings. Electronically Signed   By: Rolm Baptise M.D.   On: 02/13/2015 13:09    Microbiology: Recent Results (from the past 240 hour(s))  Culture, blood (routine x 2)     Status: None  (Preliminary result)   Collection Time: 02/10/15  2:35 PM  Result Value Ref Range Status   Specimen Description BLOOD LEFT ARM  Final   Special Requests BOTTLES DRAWN AEROBIC AND ANAEROBIC 5CC  Final   Culture   Final    NO GROWTH 4 DAYS Performed at [facility]    Report Status PENDING  Incomplete  Culture, blood (routine x 2)     Status: None (Preliminary result)   Collection Time: 02/10/15  2:42 PM  Result Value Ref Range Status   Specimen Description BLOOD LEFT HAND  Final   Special Requests BOTTLES DRAWN AEROBIC AND ANAEROBIC 5CC  Final   Culture   Final    NO GROWTH 4 DAYS Performed at [facility]    Report Status PENDING  Incomplete  Culture, respiratory (NON-Expectorated)     Status: None  Collection Time: 02/10/15  4:30 PM  Result Value Ref Range Status   Specimen Description SPUTUM  Final   Special Requests NONE  Final   Gram Stain   Final    MODERATE WBC FEW SQUAMOUS EPITHELIAL CELLS PRESENT MODERATE GRAM POSITIVE COCCI IN CLUSTERS FEW YEAST Performed at [facility]    Culture   Final    NORMAL OROPHARYNGEAL FLORA Performed at [facility]    Report Status 02/12/2015 FINAL  Final  Culture, expectorated sputum-assessment     Status: None   Collection Time: 02/10/15  4:35 PM  Result Value Ref Range Status   Specimen Description SPUTUM  Final   Special Requests NONE  Final   Sputum evaluation   Final    THIS SPECIMEN IS ACCEPTABLE. RESPIRATORY CULTURE REPORT TO FOLLOW.   Report Status 02/10/2015 FINAL  Final     Labs: Basic Metabolic Panel:  Recent Labs Lab 02/10/15 0900 02/11/15 0112 02/12/15 0450 02/14/15 0758 02/15/15 0340  NA 141 141 144 141 139  K 2.7* 3.4* 3.3* 3.1* 2.8*  CL 103 109 109 106 106  CO2 '31 26 28 27 26  '$ GLUCOSE 101* 91 107* 121* 115*  BUN 21* 21* '18 16 14  '$ CREATININE 1.26* 1.03 1.09 0.93 0.89  CALCIUM 12.1* 11.6* 12.1* 12.3* 12.1*  MG  --  1.6*  --   --   --    Liver Function Tests:  Recent  Labs Lab 02/10/15 0900 02/15/15 0340  AST 24 15  ALT 29 9*  ALKPHOS 85 78  BILITOT 0.5 0.6  PROT 6.5 6.0*  ALBUMIN 3.3* 2.7*   No results for input(s): LIPASE, AMYLASE in the last 168 hours. No results for input(s): AMMONIA in the last 168 hours. CBC:  Recent Labs Lab 02/10/15 0900 02/11/15 0112 02/12/15 0450 02/14/15 0758 02/15/15 0340  WBC 8.4 7.6 6.4 6.5 5.8  NEUTROABS 5.7  --   --  4.2  --   HGB 11.8* 10.9* 10.3* 11.1* 10.4*  HCT 35.6* 32.5* 30.8* 33.3* 30.9*  MCV 92.2 91.8 92.5 91.5 88.8  PLT 211 207 183 207 211   Cardiac Enzymes:  Recent Labs Lab 02/10/15 1339 02/10/15 1912 02/11/15 0112  TROPONINI 0.07* 0.07* 0.06*   BNP: BNP (last 3 results) No results for input(s): BNP in the last 8760 hours.  ProBNP (last 3 results) No results for input(s): PROBNP in the last 8760 hours.  CBG: No results for input(s): GLUCAP in the last 168 hours.     Signed:  Nita Sells MD  FACP  [facility] 02/15/2015, 10:23 AM

## 2015-02-15 NOTE — Progress Notes (Addendum)
Daily Progress Note   Patient Name: [name]       Date: 02/15/2015 DOB: 1933/11/06  Age: 81 y.o. MRN#: [id number] Attending Physician: Pedro Sells, MD Primary Care Physician: Pedro Rim, MD Admit Date: 02/10/2015  Reason for Consultation/Follow-up: Establishing goals of care and Psychosocial/spiritual support-Transition at EOL   Subjective:  - Continued conversation with family at bedsdie regarding current medical situation, long term prognosis, values and GOC important to this patient and his family   -All realize his limited life expectancy.  She now realizes that she cannot care for him at home and hope is for hospice facility.  Patient is minimally responsive, taking only sips of fluids.  Once IV/artifial hydration is stopped, and no further electrolyte correction (K+ 2.4 today) this patient will decline rapidly and prognosis will likely be days to weeks  This family is very familiar with [facility], the patient's wife died there about one year ago.  Family will too benefit from hospice with their multiply layers of grief.  -PMT will continue to support holistically and help family navigate transition    Length of Stay: 5 days  Current Medications: Scheduled Meds:  . amiodarone  100 mg Oral QHS  . amoxicillin-clavulanate  1 tablet Oral Q12H  . antiseptic oral rinse  7 mL Mouth Rinse BID  . aspirin  81 mg Oral Daily  . carbidopa-levodopa  1 tablet Oral TID  . clopidogrel  75 mg Oral Daily  . erythromycin  1 application Right Eye TID  . feeding supplement  1 Container Oral TID BM  . feeding supplement (ENSURE ENLIVE)  237 mL Oral BID BM  . fluticasone  1 spray Each Nare Daily  . heparin  5,000 Units Subcutaneous 3 times per day  . isosorbide  mononitrate  60 mg Oral q morning - 10a  . pantoprazole  40 mg Oral Daily  . potassium chloride  40 mEq Oral BID  . sodium chloride  3 mL Intravenous Q12H    Continuous Infusions: . dextrose 5 % and 0.45% NaCl 75 mL/hr at 02/14/15 2123    PRN Meds: acetaminophen, albuterol, albuterol, alum & mag hydroxide-simeth, bisacodyl, diazepam, guaiFENesin-dextromethorphan, hydrALAZINE, HYDROcodone-acetaminophen, ondansetron **OR** ondansetron (ZOFRAN) IV  Physical Exam: Physical Exam  Constitutional: He appears lethargic. He appears cachectic. He appears ill.  HENT:  Throat secretion less today, moist buccal membranes  Cardiovascular: Normal rate, regular rhythm and normal heart sounds.   Pulmonary/Chest: He has decreased breath sounds in the right lower field and the left lower field. He has rhonchi in the right upper field.  Musculoskeletal:  Generalized weakness and muscle atrophy   Neurological: He appears lethargic. He displays atrophy.  Skin: Skin is warm and dry.                Vital Signs: BP 120/65 mmHg  Pulse 100  Temp(Src) 97.5 F (36.4 C) (Oral)  Resp 18  Ht '6\' 3"'$  (1.905 m)  Wt 73.5 kg (162 lb 0.6 oz)  BMI 20.25 kg/m2  SpO2 98% SpO2: SpO2: 98 % O2 Device: O2 Device: Not Delivered O2 Flow Rate: O2 Flow Rate (L/min): 4 L/min  Intake/output summary:   Intake/Output Summary (Last 24 hours) at 02/15/15 0945 Last data filed at 02/15/15 3244  Gross per 24 hour  Intake   2160 ml  Output   1850 ml  Net    310 ml   LBM: Last BM Date: 02/11/15 Baseline Weight: Weight: 83.008 kg (183 lb) Most recent weight: Weight: 73.5 kg (162 lb 0.6 oz)       Palliative Assessment/Data: Flowsheet Rows        Most Recent Value   Intake Tab    Referral Department  Hospitalist   Unit at Time of Referral  Cardiac/Telemetry Unit   Palliative Care Primary Diagnosis  Cancer   Date Notified  02/11/15   Palliative Care Type  New Palliative care   Reason for referral  Clarify Goals of Care    Date of Admission  02/10/15   Date first seen by Palliative Care  02/12/15   # of days Palliative referral response time  1 Day(s)   # of days IP prior to Palliative referral  1   Clinical Assessment    Psychosocial & Spiritual Assessment    Palliative Care Outcomes       Additional Data Reviewed: CBC    Component Value Date/Time   WBC 5.8 02/15/2015 0340   WBC 6.4 01/16/2015 1255   RBC 3.48* 02/15/2015 0340   RBC 3.87* 01/16/2015 1255   RBC 4.26 11/10/2013 2048   HGB 10.4* 02/15/2015 0340   HGB 12.0* 01/16/2015 1255   HCT 30.9* 02/15/2015 0340   HCT 36.2* 01/16/2015 1255   PLT 211 02/15/2015 0340   PLT 236 01/16/2015 1255   MCV 88.8 02/15/2015 0340   MCV 93.5 01/16/2015 1255   MCH 29.9 02/15/2015 0340   MCH 31.0 01/16/2015 1255   MCHC 33.7 02/15/2015 0340   MCHC 33.1 01/16/2015 1255   RDW 13.7 02/15/2015 0340   RDW 14.4 01/16/2015 1255   LYMPHSABS 1.2 02/14/2015 0758   LYMPHSABS 1.6 01/16/2015 1255   MONOABS 0.7 02/14/2015 0758   MONOABS 0.7 01/16/2015 1255   EOSABS 0.3 02/14/2015 0758   EOSABS 0.4 01/16/2015 1255   BASOSABS 0.0 02/14/2015 0758   BASOSABS 0.0 01/16/2015 1255    CMP     Component Value Date/Time   NA 139 02/15/2015 0340   NA 138 01/16/2015 1255   K 2.8* 02/15/2015 0340   K 3.5 01/16/2015 1255   CL 106 02/15/2015 0340   CO2 26 02/15/2015 0340   CO2 27 01/16/2015 1255   GLUCOSE 115* 02/15/2015 0340   GLUCOSE 108 01/16/2015 1255   BUN 14 02/15/2015 0340   BUN 11.7 01/16/2015 1255   CREATININE  0.89 02/15/2015 0340   CREATININE 0.9 01/16/2015 1255   CREATININE 0.75 12/28/2013 1152   CALCIUM 12.1* 02/15/2015 0340   CALCIUM 12.2* 01/16/2015 1255   PROT 6.0* 02/15/2015 0340   PROT 6.6 01/16/2015 1255   ALBUMIN 2.7* 02/15/2015 0340   ALBUMIN 3.4* 01/16/2015 1255   AST 15 02/15/2015 0340   AST 63* 01/16/2015 1255   ALT 9* 02/15/2015 0340   ALT 43 01/16/2015 1255   ALKPHOS 78 02/15/2015 0340   ALKPHOS 88 01/16/2015 1255   BILITOT 0.6  02/15/2015 0340   BILITOT 0.66 01/16/2015 1255   GFRNONAA >60 02/15/2015 0340   GFRAA >60 02/15/2015 0340       Problem List:  Patient Active Problem List   Diagnosis Date Noted  . Pressure ulcer 02/13/2015  . Aspiration into airway   . DNR (do not resuscitate) discussion 02/12/2015  . Palliative care encounter 02/12/2015  . Adult failure to thrive   . Dysphagia   . Hypokalemia 02/10/2015  . Acute bronchitis 02/10/2015  . Bell's palsy   . Protein-calorie malnutrition, severe ([place]) 11/24/2014  . Malnutrition ([place]) 11/24/2014  . Hyperglycemia 11/23/2014  . Decreased oral intake 11/23/2014  . Herpes zoster 11/23/2014  . H/O: GI bleed- Sept 2015 01/05/2014  . Bradycardia- HR 45-55 01/05/2014  . Lower extremity edema 12/12/2013  . Claudication ([place]) 11/20/2013  . Orthostatic hypotension 11/11/2013  . Anemia, iron deficiency 11/11/2013  . Dizziness 11/10/2013  . Left carotid bruit 08/30/2013  . SVT (supraventricular tachycardia) ([place]) 08/21/2012  . Cardiomyopathy, ischemic-EF 30-35% 08/21/2012  . Tobacco abuse 08/21/2012  . COPD (chronic obstructive pulmonary disease) ([place]) 08/21/2012  . Unstable angina ([place]) 08/19/2012  . Sinusitis acute 10/24/2010  . CAD- CFX PCI '06, CTO RCA, distal CFX   . PVC (premature ventricular contraction)   . HTN (hypertension)   . Lung cancer ([place])   . Paroxysmal a-fib- NSR on Amiodarone   . PAD-Lt SFA PTA 01/04/14      Palliative Care Assessment & Plan    1.Code Status:  DNR    Code Status Orders        Start     Ordered   02/12/15 1248  Do not attempt resuscitation (DNR)   Continuous    Question Answer Comment  In the event of cardiac or respiratory ARREST Do not call a "code blue"   In the event of cardiac or respiratory ARREST Do not perform Intubation, CPR, defibrillation or ACLS   In the event of cardiac or respiratory ARREST Use medication by any route, position, wound care, and other measures to relive pain and  suffering. May use oxygen, suction and manual treatment of airway obstruction as needed for comfort.      02/12/15 1247    Advance Directive Documentation        Most Recent Value   Type of Advance Directive  Healthcare Power of Attorney, Living will   Pre-existing out of facility DNR order (yellow form or pink MOST form)     "MOST" Form in Place?         Goals of Care/Additional Recommendations:   Focus is comfort and dignity    Limitations on Scope of Treatment: No Artificial Feeding  Desire for further Chaplaincy support:no  Psycho-social Needs: Education on Hospice and Grief/Bereavement Support     Palliative Prophylaxis:   Aspiration, Bowel Regimen, Delirium Protocol, Eye Care, Frequent Pain Assessment and Oral Care   Symptom Management:  Anticipate more aggressive symptoms needs now that shift  has been made and focus is comfort     Dyspnea/Pain: Roxanol 5 mg po/sl every 1 hr prn     Agitation: Ativan 1 mg po/sl every 6 hrs prn   Prognosis: days to weeks    Discharge Planning:   Hopeful for hospice facility    Care plan was discussed with Dr Verlon Au  Thank you for allowing the Palliative Medicine Team to assist in the care of this patient.   Time In: 0900 Time Out: 0945 Total Time 45  min Prolonged Time Billed  no         Knox Royalty, NP  02/15/2015, 9:45 AM  Please contact Palliative Medicine Team phone at [phone] for questions and concerns.

## 2015-02-15 NOTE — Clinical Social Work Note (Signed)
Clinical Social Work Assessment-Late Entry  Patient Details  Name: [name] MRN: [id number] Date of Birth: 29-May-1933  Date of referral:  02/14/15               Reason for consult:  Discharge Planning                Permission sought to share information with:  Family Supports Permission granted to share information::     Name::     Deneise Lever  Agency::     Relationship::  daughter  Contact Information:  [phone]  Housing/Transportation Living arrangements for the past 2 months:  Single Family Home Source of Information:  Adult Children Patient Interpreter Needed:  None Criminal Activity/Legal Involvement Pertinent to Current Situation/Hospitalization:  No - Comment as needed Significant Relationships:  Adult Children Lives with:  Adult Children Do you feel safe going back to the place where you live?  No Need for family participation in patient care:  Yes (Comment)  Care giving concerns:  CSW notified from [facility] that pt family requesting residential hospice and want [facility].    Social Worker assessment / plan:  CSW received referral for residential hospice placement.   Per RNCM, pt family requesting [facility] met with pt daughter at bedside. CSW introduced self and explained role. CSW offered choice. Pt daughter confirmed choice of [facility]. Pt daughter shared that pt wife was at [facility] last year where pt wife passed. Pt daughter discussed that facility is ten minutes from their home. CSW discussed process and clarified questions. Pt daughter agreeable to referral to [facility] contacted [facility] and faxed referral to facility.   CSW to follow up with [facility] in AM regarding if facility can accept pt and if bed available.   CSW to continue to follow to provide support and assist with pt disposition needs.   Employment  status:    Insurance informationEducational psychologist PT Recommendations:  Not assessed at this time Information / Referral to community resources:  Other (Comment Required) (residential hospice placement)  Patient/Family's Response to care:  Pt sleeping soundly during visit. Unable to participate in assessment. Pt daughter supportive and actively involved in pt care. Pt daughter hopeful that [facility] will have bed available for pt.   Patient/Family's Understanding of and Emotional Response to Diagnosis, Current Treatment, and Prognosis:  Pt daughter coping as well as to be anticipated with pt diagnosis and prognosis. Pt daughter feels pt care will be best met at [facility] upon discharge.  Emotional Assessment Appearance:  Appears stated age Attitude/Demeanor/Rapport:  Unable to Assess Affect (typically observed):  Unable to Assess Orientation:  Oriented to Self Alcohol / Substance use:  Not Applicable Psych involvement (Current and /or in the community):  No (Comment)  Discharge Needs  Concerns to be addressed:  Discharge Planning Concerns Readmission within the last 30 days:  No Current discharge risk:  Terminally ill Barriers to Discharge:  No Barriers Identified   [place], [place], LCSW 02/15/2015, 4:53 PM  [phone]

## 2015-02-15 NOTE — Progress Notes (Signed)
Patient c/o difficulty breathing and back pain.  Nasal cannula O2 at 2 liters initiated and Morphine 1 mg IV given.  Wadie Lessen NP

## 2015-02-15 NOTE — Progress Notes (Signed)
Patient for discharge to [facility] facilitated pt discharge needs including contacting facility, faxing pt discharge information to [facility], discussing with pt family at bedside, providing RN phone number to call report, and arranging ambulance transport for pt to [facility] family coping appropriately with transfer as pt family familiar with [facility] as pt wife passed away at facility. Pt family grateful that [facility] has a bed available today.  No further social work needs identified at this time.  CSW signing off.   Alison Murray, MSW, [facility] Work [phone]

## 2015-02-15 NOTE — Progress Notes (Signed)
Report called to Maudie Mercury, RN at [facility] at 1200. Patient stable, resting comfortably. Family at bedside. Awaiting transport at this time.

## 2015-02-24 DEATH — deceased

## 2015-03-28 ENCOUNTER — Ambulatory Visit: Payer: Medicare Other | Admitting: Diagnostic Neuroimaging

## 2015-04-01 ENCOUNTER — Encounter: Payer: Self-pay | Admitting: Diagnostic Neuroimaging

## 2015-04-05 ENCOUNTER — Telehealth: Payer: Self-pay | Admitting: Diagnostic Neuroimaging

## 2015-04-05 NOTE — Telephone Encounter (Signed)
Pt's daughter called said she rec'd letter that pt missed appt and wanted to alert [facility] that pt passed 01/27/2015.
